# Patient Record
Sex: Male | Born: 1935 | Race: White | Hispanic: No | Marital: Married | Smoking: Former smoker
Health system: Southern US, Community
[De-identification: ages and names within clinical notes are randomized; demographics above are authoritative.]

## PROBLEM LIST (undated history)

## (undated) DIAGNOSIS — I2510 Atherosclerotic heart disease of native coronary artery without angina pectoris: Secondary | ICD-10-CM

## (undated) DIAGNOSIS — I10 Essential (primary) hypertension: Secondary | ICD-10-CM

## (undated) DIAGNOSIS — I509 Heart failure, unspecified: Secondary | ICD-10-CM

## (undated) DIAGNOSIS — M1990 Unspecified osteoarthritis, unspecified site: Secondary | ICD-10-CM

## (undated) DIAGNOSIS — K219 Gastro-esophageal reflux disease without esophagitis: Secondary | ICD-10-CM

## (undated) DIAGNOSIS — K759 Inflammatory liver disease, unspecified: Secondary | ICD-10-CM

## (undated) DIAGNOSIS — R0602 Shortness of breath: Secondary | ICD-10-CM

## (undated) DIAGNOSIS — N400 Enlarged prostate without lower urinary tract symptoms: Secondary | ICD-10-CM

## (undated) DIAGNOSIS — R21 Rash and other nonspecific skin eruption: Secondary | ICD-10-CM

## (undated) DIAGNOSIS — E782 Mixed hyperlipidemia: Secondary | ICD-10-CM

## (undated) DIAGNOSIS — E119 Type 2 diabetes mellitus without complications: Secondary | ICD-10-CM

## (undated) DIAGNOSIS — E785 Hyperlipidemia, unspecified: Secondary | ICD-10-CM

## (undated) HISTORY — DX: Atherosclerotic heart disease of native coronary artery without angina pectoris: I25.10

## (undated) HISTORY — DX: Mixed hyperlipidemia: E78.2

## (undated) HISTORY — DX: Hyperlipidemia, unspecified: E78.5

## (undated) HISTORY — DX: Essential (primary) hypertension: I10

## (undated) HISTORY — PX: CATARACT EXTRACTION W/ INTRAOCULAR LENS  IMPLANT, BILATERAL: SHX1307

## (undated) HISTORY — PX: HYDROCELE EXCISION: SHX482

## (undated) HISTORY — PX: CARDIAC CATHETERIZATION: SHX172

## (undated) HISTORY — PX: CORONARY ARTERY BYPASS GRAFT: SHX141

## (undated) HISTORY — DX: Rash and other nonspecific skin eruption: R21

---
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
## 2002-03-21 ENCOUNTER — Encounter: Payer: Self-pay | Admitting: Cardiology

## 2002-03-21 ENCOUNTER — Ambulatory Visit (HOSPITAL_COMMUNITY): Admission: RE | Admit: 2002-03-21 | Discharge: 2002-03-21 | Payer: Self-pay | Admitting: Cardiology

## 2004-04-25 ENCOUNTER — Ambulatory Visit: Payer: Self-pay | Admitting: Cardiology

## 2005-03-22 ENCOUNTER — Ambulatory Visit: Payer: Self-pay

## 2005-05-03 ENCOUNTER — Ambulatory Visit: Payer: Self-pay | Admitting: Cardiology

## 2006-04-11 ENCOUNTER — Ambulatory Visit: Payer: Self-pay | Admitting: Cardiology

## 2007-04-18 ENCOUNTER — Ambulatory Visit: Payer: Self-pay | Admitting: Cardiology

## 2008-04-13 DIAGNOSIS — I10 Essential (primary) hypertension: Secondary | ICD-10-CM | POA: Insufficient documentation

## 2008-04-13 DIAGNOSIS — E785 Hyperlipidemia, unspecified: Secondary | ICD-10-CM | POA: Insufficient documentation

## 2008-04-13 DIAGNOSIS — E119 Type 2 diabetes mellitus without complications: Secondary | ICD-10-CM

## 2008-04-13 DIAGNOSIS — I25810 Atherosclerosis of coronary artery bypass graft(s) without angina pectoris: Secondary | ICD-10-CM | POA: Insufficient documentation

## 2008-04-15 ENCOUNTER — Ambulatory Visit: Payer: Self-pay | Admitting: Cardiology

## 2008-04-21 ENCOUNTER — Ambulatory Visit: Payer: Self-pay

## 2008-07-17 ENCOUNTER — Ambulatory Visit (HOSPITAL_BASED_OUTPATIENT_CLINIC_OR_DEPARTMENT_OTHER): Admission: RE | Admit: 2008-07-17 | Discharge: 2008-07-17 | Payer: Self-pay | Admitting: Urology

## 2008-09-18 ENCOUNTER — Encounter: Payer: Self-pay | Admitting: Cardiology

## 2008-12-23 ENCOUNTER — Encounter: Payer: Self-pay | Admitting: Cardiology

## 2008-12-23 ENCOUNTER — Telehealth (INDEPENDENT_AMBULATORY_CARE_PROVIDER_SITE_OTHER): Payer: Self-pay | Admitting: *Deleted

## 2009-05-04 ENCOUNTER — Encounter: Payer: Self-pay | Admitting: Cardiology

## 2009-05-13 ENCOUNTER — Ambulatory Visit: Payer: Self-pay | Admitting: Cardiology

## 2010-01-20 ENCOUNTER — Telehealth (INDEPENDENT_AMBULATORY_CARE_PROVIDER_SITE_OTHER): Payer: Self-pay | Admitting: *Deleted

## 2010-02-07 ENCOUNTER — Encounter: Admission: RE | Admit: 2010-02-07 | Discharge: 2010-02-07 | Payer: Self-pay | Admitting: Family Medicine

## 2010-03-24 ENCOUNTER — Encounter: Payer: Self-pay | Admitting: Cardiology

## 2010-03-25 ENCOUNTER — Encounter: Payer: Self-pay | Admitting: Cardiology

## 2010-04-14 ENCOUNTER — Ambulatory Visit: Payer: Self-pay | Admitting: Cardiology

## 2010-04-14 ENCOUNTER — Encounter: Payer: Self-pay | Admitting: Cardiology

## 2010-05-15 HISTORY — PX: HERNIA REPAIR: SHX51

## 2010-06-14 NOTE — Progress Notes (Signed)
   Walk in Patient Form Recieved " Pt needs Meds filled" sent to Message Nurse [facility]  January 20, 2010 2:51 PM

## 2010-06-14 NOTE — Assessment & Plan Note (Signed)
Summary: f1y  Medications Added GLYBURIDE-METFORMIN 5-500 MG TABS (GLYBURIDE-METFORMIN) 2 tablets two times a day        Visit Type:  1  year follow up Primary Provider:  Dr. Windle Guard  CC:  No complaints.  History of Present Illness: The patient is 77 years old and return for management of CAD. He had bypass surgery in 1983 and redo bypass surgery in 1997. In 2003 all 3 grafts were patent.  He's done quite well over the past year with no chest pain shortness of breath or palpitations.  His other problems include hypertension hyperlipidemia and diabetes.  His cholesterol levels in the past have been quite good with HDL of 43 and an LDL of 55 by Dr. Jeannetta Nap.  His wife of 62 years was with him today.  Current Medications (verified): 1)  Metoprolol Succinate 50 Mg Xr24h-Tab (Metoprolol Succinate) .... Take One Tablet By Mouth Daily 2)  Vytorin 10-10 Mg Tabs (Ezetimibe-Simvastatin) .... Take One Tablet By Mouth Daily At Bedtime 3)  Benazepril Hcl 10 Mg Tabs (Benazepril Hcl) .... One Tab By Mouth Once Daily 4)  Finasteride 5 Mg Tabs (Finasteride) .... One Tab By Mouth Once Daily 5)  Actos 15 Mg Tabs (Pioglitazone Hcl) .... One Tab By Mouth Once Daily 6)  Glyburide-Metformin 5-500 Mg Tabs (Glyburide-Metformin) .... 2 Tablets Two Times A Day 7)  Aspirin Ec 325 Mg Tbec (Aspirin) .... Take One Tablet By Mouth Daily 8)  Vitamin D 13086 Iu .... One Tab By Mouth Every 2 Weeks 9)  Uroxatral 10 Mg Xr24h-Tab (Alfuzosin Hcl) .... One Tab By Mouth Once Daily  Allergies: 1)  ! Lipitor (Atorvastatin) 2)  ! Pravachol 3)  ! Crestor (Rosuvastatin Calcium)  Past History:  Past Medical History: Reviewed history from 05/04/2009 and no changes required. Current Problems:  HYPERLIPIDEMIA-MIXED (ICD-272.4) HYPERTENSION, BENIGN (ICD-401.1) CAD, AUTOLOGOUS BYPASS GRAFT (ICD-414.02) DIABETES MELLITUS, TYPE II, CONTROLLED (ICD-250.00)   IMPRESSION: 1. Coronary artery status post coronary  bypass graft surgery in 1996     with patent grafts in 2003 now stable. 2. Ejection fraction 47% by last Myoview. 3. Hyperlipidemia with good lipid profile with cholesterol 90, LDL 33,     HDL 37. 4. Diabetes. 5. Hypertension. 6. Skin rash of uncertain etiology.   Review of Systems       ROS is negative except as outlined in HPI.   Vital Signs:  Patient profile:   77 year old male Height:      69 inches Weight:      202.50 pounds BMI:     30.01 Pulse rate:   73 / minute Pulse rhythm:   regular Resp:     18 per minute BP sitting:   130 / 62  (left arm) Cuff size:   large  Vitals Entered By: Vikki Ports (April 14, 2010 8:43 AM)  Physical Exam  Additional Exam:  Gen. Well-nourished, in no distress   Neck: No JVD, thyroid not enlarged, no carotid bruits Lungs: No tachypnea, clear without rales, rhonchi or wheezes Cardiovascular: Rhythm regular, PMI not displaced,  heart sounds  normal, no murmurs or gallops, no peripheral edema, pulses normal in all 4 extremities. Abdomen: BS normal, abdomen soft and non-tender without masses or organomegaly, no hepatosplenomegaly. MS: No deformities, no cyanosis or clubbing   Neuro:  No focal sns   Skin:  no lesions    Impression & Recommendations:  Problem # 1:  CAD, AUTOLOGOUS BYPASS GRAFT (ICD-414.02) He has had prior redo bypass  surgery. He has no chest pain and his palm appears stable. His updated medication list for this problem includes:    Metoprolol Succinate 50 Mg Xr24h-tab (Metoprolol succinate) .Marland Kitchen... Take one tablet by mouth daily    Benazepril Hcl 10 Mg Tabs (Benazepril hcl) ..... One tab by mouth once daily    Aspirin Ec 325 Mg Tbec (Aspirin) .Marland Kitchen... Take one tablet by mouth daily  Orders: EKG w/ Interpretation (93000)  Problem # 2:  HYPERTENSION, BENIGN (ICD-401.1) His blood pressure is well controlled on current medications. His updated medication list for this problem includes:    Metoprolol Succinate 50 Mg  Xr24h-tab (Metoprolol succinate) .Marland Kitchen... Take one tablet by mouth daily    Benazepril Hcl 10 Mg Tabs (Benazepril hcl) ..... One tab by mouth once daily    Aspirin Ec 325 Mg Tbec (Aspirin) .Marland Kitchen... Take one tablet by mouth daily  Problem # 3:  HYPERLIPIDEMIA-MIXED (ICD-272.4) He had an excellent lipid profile recently on Vytorin. His updated medication list for this problem includes:    Vytorin 10-10 Mg Tabs (Ezetimibe-simvastatin) .Marland Kitchen... Take one tablet by mouth daily at bedtime  Patient Instructions: 1)  Your physician recommends that you continue on your current medications as directed. Please refer to the Current Medication list given to you today. 2)  Your physician wants you to follow-up in:  1 year with Dr. Eden Emms. You will receive a reminder letter in the mail two months in advance. If you don't receive a letter, please call our office to schedule the follow-up appointment.

## 2010-06-20 ENCOUNTER — Encounter: Payer: Self-pay | Admitting: Cardiovascular Disease

## 2010-06-30 ENCOUNTER — Telehealth (INDEPENDENT_AMBULATORY_CARE_PROVIDER_SITE_OTHER): Payer: Self-pay | Admitting: Radiology

## 2010-06-30 ENCOUNTER — Other Ambulatory Visit (HOSPITAL_COMMUNITY): Payer: Self-pay

## 2010-07-04 ENCOUNTER — Ambulatory Visit (HOSPITAL_COMMUNITY): Payer: Medicare Other | Attending: Cardiovascular Disease

## 2010-07-04 ENCOUNTER — Encounter: Payer: Self-pay | Admitting: Internal Medicine

## 2010-07-04 DIAGNOSIS — R9431 Abnormal electrocardiogram [ECG] [EKG]: Secondary | ICD-10-CM

## 2010-07-04 DIAGNOSIS — R0989 Other specified symptoms and signs involving the circulatory and respiratory systems: Secondary | ICD-10-CM

## 2010-07-04 DIAGNOSIS — I2510 Atherosclerotic heart disease of native coronary artery without angina pectoris: Secondary | ICD-10-CM | POA: Insufficient documentation

## 2010-07-06 ENCOUNTER — Ambulatory Visit (INDEPENDENT_AMBULATORY_CARE_PROVIDER_SITE_OTHER): Payer: Medicare Other | Admitting: Cardiovascular Disease

## 2010-07-06 ENCOUNTER — Encounter: Payer: Self-pay | Admitting: Cardiovascular Disease

## 2010-07-06 DIAGNOSIS — I10 Essential (primary) hypertension: Secondary | ICD-10-CM

## 2010-07-06 DIAGNOSIS — I2510 Atherosclerotic heart disease of native coronary artery without angina pectoris: Secondary | ICD-10-CM

## 2010-07-06 DIAGNOSIS — E785 Hyperlipidemia, unspecified: Secondary | ICD-10-CM

## 2010-07-06 NOTE — Progress Notes (Signed)
Summary: nuc pre-procedure  Phone Note Outgoing Call   Call placed by: Harlow Asa CNMT Call placed to: Patient Reason for Call: Confirm/change Appt Summary of Call: Reviewed information on Myoview Information Sheet (see scanned document for further details).  Spoke with patient.      Nuclear Med Background Indications for Stress Test: Evaluation for Ischemia, Graft Patency   History: CABG, Heart Catheterization, Myocardial Perfusion Study  History Comments: MI-ant ;  '83 CABG-Redo '97; '03 Cath-grafts patent; MPS -EF=49% (-) isch. &  ant apical scar     Nuclear Pre-Procedure Cardiac Risk Factors: Family History - CAD, Hypertension, Lipids, NIDDM Height (in): 69

## 2010-07-12 NOTE — Assessment & Plan Note (Signed)
Summary: surgical clearence/former pt of brodie/dm  appt confirm=mj   Primary Provider:  Dr. Windle Guard  CC:  follow up.  History of Present Illness: The patient is 77 years old and return for management of CAD. He had bypass surgery in 1983 and redo bypass surgery in 1997. In 2003 all 3 grafts were patent.  He's done quite well over the past year with no chest pain shortness of breath or palpitations.  His other problems include hypertension hyperlipidemia and diabetes.  His cholesterol levels in the past have been quite good with HDL of 43 and an LDL of 55 by Dr. Jeannetta Nap.  His wife of 62 years was with him today.  he needs inguinal hernia surgery with Dr Luisa Hart.  I reviewed his myovue from 07/04/10 and he has an old anterior MI with no ischemia and EF 41%  This is considered low risk with no symptoms and non vascular surgery.    He is clear to have surgery with Dr Luisa Hart.  Instructed to take lopresser right up until morning of surgery and continue in hospital.  Hold ASA per surgical recomendations  Current Problems (verified): 1)  Hyperlipidemia-mixed  (ICD-272.4) 2)  Hypertension, Benign  (ICD-401.1) 3)  Cad, Autologous Bypass Graft  (ICD-414.02) 4)  Diabetes Mellitus, Type II, Controlled  (ICD-250.00)  Current Medications (verified): 1)  Metoprolol Succinate 50 Mg Xr24h-Tab (Metoprolol Succinate) .... Take One Tablet By Mouth Daily 2)  Vytorin 10-10 Mg Tabs (Ezetimibe-Simvastatin) .... Take One Tablet By Mouth Daily At Bedtime 3)  Benazepril Hcl 10 Mg Tabs (Benazepril Hcl) .... One Tab By Mouth Once Daily 4)  Finasteride 5 Mg Tabs (Finasteride) .... One Tab By Mouth Once Daily 5)  Actos 15 Mg Tabs (Pioglitazone Hcl) .... One Tab By Mouth Once Daily 6)  Glyburide-Metformin 5-500 Mg Tabs (Glyburide-Metformin) .... 2 Tablets Two Times A Day 7)  Aspirin Ec 325 Mg Tbec (Aspirin) .... Take One Tablet By Mouth Daily 8)  Vitamin D 19147 Iu .... One Tab By Mouth Every 2 Weeks 9)   Uroxatral 10 Mg Xr24h-Tab (Alfuzosin Hcl) .... One Tab By Mouth Once Daily  Allergies (verified): 1)  ! Lipitor (Atorvastatin) 2)  ! Pravachol 3)  ! Crestor (Rosuvastatin Calcium)  Past History:  Past Medical History: Last updated: 05/04/2009 Current Problems:  HYPERLIPIDEMIA-MIXED (ICD-272.4) HYPERTENSION, BENIGN (ICD-401.1) CAD, AUTOLOGOUS BYPASS GRAFT (ICD-414.02) DIABETES MELLITUS, TYPE II, CONTROLLED (ICD-250.00)   IMPRESSION: 1. Coronary artery status post coronary bypass graft surgery in 1996     with patent grafts in 2003 now stable. 2. Ejection fraction 47% by last Myoview. 3. Hyperlipidemia with good lipid profile with cholesterol 90, LDL 33,     HDL 37. 4. Diabetes. 5. Hypertension. 6. Skin rash of uncertain etiology.   Past Surgical History: Last updated: 05/04/2009   Left hydrocelectomy.   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   Vital Signs:  Patient profile:   77 year old male Height:      69 inches Weight:  201 pounds BMI:     29.79 Pulse rate:   72 / minute Resp:     18 per minute BP sitting:   136 / 70  (left arm)  Vitals Entered By: Kem Parkinson (July 06, 2010 9:40 AM)  Physical Exam  General:  Affect appropriate Healthy:  appears stated age HEENT: normal Neck supple with no adenopathy JVP normal no bruits no thyromegaly Lungs clear with no  wheezing and good diaphragmatic motion Heart:  S1/S2 no murmur,rub, gallop or click PMI normal Abdomen: benighn, BS positve, no tenderness, no AAA no bruit.  No HSM or HJR Distal pulses intact with no bruits No edema Neuro non-focal Skin warm and dry Right inguinal hernia   Impression & Recommendations:  Problem # 1:  HYPERLIPIDEMIA-MIXED (ICD-272.4) Well controlled. Intolerant to lipitor His updated medication list for this problem includes:    Vytorin 10-10 Mg Tabs (Ezetimibe-simvastatin) .Marland Kitchen... Take one tablet by mouth daily at bedtime  Problem # 2:  HYPERTENSION, BENIGN (ICD-401.1) Well controlled His updated medication list for this problem includes:    Metoprolol Succinate 50 Mg Xr24h-tab (Metoprolol succinate) .Marland Kitchen... Take one tablet by mouth daily    Benazepril Hcl 10 Mg Tabs (Benazepril hcl) ..... One tab by mouth once daily    Aspirin Ec 325 Mg Tbec (Aspirin) .Marland Kitchen... Take one tablet by mouth daily  Problem # 3:  CAD, AUTOLOGOUS BYPASS GRAFT (ICD-414.02) Low risk myovue  No angina  Clear for inguinal hernia surgery with Dr Luisa Hart.  Cotninue BB His updated medication list for this problem includes:    Metoprolol Succinate 50 Mg Xr24h-tab (Metoprolol succinate) .Marland Kitchen... Take one tablet by mouth daily    Benazepril Hcl 10 Mg Tabs (Benazepril hcl) ..... One tab by mouth once daily    Aspirin Ec 325 Mg Tbec (Aspirin) .Marland Kitchen... Take one tablet by mouth daily  Patient Instructions: 1)  Your physician recommends that you schedule a follow-up appointment in: 6 months with Dr. Eden Emms 2)   that you continue on your current medications as directed. Please refer to the Current Medication list given to you today.

## 2010-07-12 NOTE — Assessment & Plan Note (Signed)
Summary: Cardiology Nuclear Testing  Nuclear Med Background Indications for Stress Test: Evaluation for Ischemia, Surgical Clearance, Graft Patency  Indications Comments: Pending (R) Inguinal hernia repair by [facility] [place] surgery.  History: CABG, Heart Catheterization, Myocardial Perfusion Study  History Comments: MI-AWMI ;  '83 CABG-Redo '97; '03 Cath-grafts patent; MPS -EF=49% (-) isch. &  ant apical scar.  Symptoms: DOE, Fatigue, Fatigue with Exertion, SOB  Symptoms Comments: Bilateral leg tightness with exertion per patient.   Nuclear Pre-Procedure Cardiac Risk Factors: Family History - CAD, History of Smoking, Hypertension, Lipids, NIDDM Caffeine/Decaff Intake: none NPO After: 1:30 AM Lungs: Clear IV 0.9% NS with Angio Cath: 20g     IV Site: R Forearm IV Started by: Stanton Kidney, EMT-P Chest Size (in) 44     Height (in): 69 Weight (lb): 197 BMI: 29.20 Tech Comments: metoprolol held this day, per patient.  Nuclear Med Study 1 or 2 day study:  1 day     Stress Test Type:  Eugenie Birks Reading MD:  Dietrich Pates, MD     Referring MD:  Charlton Haws Resting Radionuclide:  Technetium 63m Tetrofosmin     Resting Radionuclide Dose:  11.0 mCi  Stress Radionuclide:  Technetium 43m Tetrofosmin     Stress Radionuclide Dose:  33.0 mCi   Stress Protocol      Max HR:  81 bpm Max Systolic BP: 114 mm HgRate Pressure Product:  9234  Lexiscan: 0.4 mg   Stress Test Technologist:  Irean Hong,  RN     Nuclear Technologist:  Domenic Polite, CNMT  Rest Procedure  Myocardial perfusion imaging was performed at rest 45 minutes following the intravenous administration of Technetium 34m Tetrofosmin.  Stress Procedure  The patient received IV Lexiscan 0.4 mg over 15-seconds.  Technetium 6m Tetrofosmin injected at 30-seconds.  There were no significant changes with lexiscan. The patient had a hypotensive response with lexiscan that subsided quickly after  trendelenburg position.  Quantitative  spect images were obtained after a 45 minute delay.  QPS Raw Data Images:  Soft tissue (diaphragm, bowel acitivty) underlie heart. Stress Images:  Large defect in the anterior wall (mid, distal), anterolateral wall (distal), anteroseptal wall (distal) and apex. Rest Images:  No significant change from the stress images. Subtraction (SDS):  No evidence of ischemia. Transient Ischemic Dilatation:  1.19  (Normal <1.22)  Lung/Heart Ratio:  0.36  (Normal <0.45)  Quantitative Gated Spect Images QGS EDV:  151 ml QGS ESV:  89 ml QGS EF:  41 %   Overall Impression  Exercise Capacity: Lexiscan with no exercise. BP Response: Normal blood pressure response. Clinical Symptoms: No chest pain ECG Impression: No significant ST segment change suggestive of ischemia. Overall Impression Comments: Scar in the anterior (mid, distal), distal anteroseptal, distal anterolateral and apical walls.  No evidence of ischemia.  LVEF 41%  Appended Document: Cardiology Nuclear Testing ok for surgery  Appended Document: Cardiology Nuclear Testing report faxed to dr cornett

## 2010-07-22 ENCOUNTER — Telehealth: Payer: Self-pay | Admitting: Cardiovascular Disease

## 2010-07-26 NOTE — Progress Notes (Signed)
Summary: refill request need to resend  Phone Note Refill Request Message from:  Patient on July 22, 2010 9:26 AM  Refills Requested: Medication #1:  METOPROLOL SUCCINATE 50 MG XR24H-TAB Take one tablet by mouth daily  Medication #2:  BENAZEPRIL HCL 10 MG TABS one tab by mouth once daily  Medication #3:  VYTORIN 10-10 MG TABS Take one tablet by mouth daily at bedtime [facility] said [facility] couldnt read the fax-need to resend   Method Requested: Telephone to Pharmacy Initial call taken by: Glynda Jaeger,  July 22, 2010 9:26 AM  Follow-up for Phone Call        RX resent to pharmacy. Pt notified. Marrion Coy, CNA  July 22, 2010 10:46 AM  Follow-up by: Marrion Coy, CNA,  July 22, 2010 10:46 AM    Prescriptions: BENAZEPRIL HCL 10 MG TABS (BENAZEPRIL HCL) one tab by mouth once daily  #90 x 3   Entered by:   Marrion Coy, CNA   Authorized by:   Colon Branch, MD, [facility]   Signed by:   Marrion Coy, CNA on 07/22/2010   Method used:   Electronically to        [facility]       [address]       [place], [place]  [address]       Ph: [phone]       Fax: [phone]   RxID:   [id number] VYTORIN 10-10 MG TABS (EZETIMIBE-SIMVASTATIN) Take one tablet by mouth daily at bedtime  #90 x 3   Entered by:   Marrion Coy, CNA   Authorized by:   Colon Branch, MD, [facility]   Signed by:   Marrion Coy, CNA on 07/22/2010   Method used:   Electronically to        [facility]       [address]       [place], [place]  [address]       Ph: [phone]       Fax: [phone]   RxID:   [id number] METOPROLOL SUCCINATE 50 MG XR24H-TAB (METOPROLOL SUCCINATE) Take one tablet by mouth daily  #90 x 3   Entered by:   Marrion Coy, CNA   Authorized by:   Colon Branch, MD, [facility]   Signed by:   Marrion Coy, CNA on 07/22/2010   Method used:   Electronically to        [facility]       [address]       [place], [place]  [address]       Ph: [phone]       Fax: [phone]   RxID:   [id number]

## 2010-08-01 ENCOUNTER — Other Ambulatory Visit (HOSPITAL_COMMUNITY): Payer: Self-pay | Admitting: Surgery

## 2010-08-01 ENCOUNTER — Encounter (HOSPITAL_COMMUNITY)
Admission: RE | Admit: 2010-08-01 | Discharge: 2010-08-01 | Disposition: A | Discharge status: Home / Self Care | Payer: Medicare Other | Source: Ambulatory Visit | Attending: Surgery | Admitting: Surgery

## 2010-08-01 DIAGNOSIS — Z01811 Encounter for preprocedural respiratory examination: Secondary | ICD-10-CM

## 2010-08-01 LAB — DIFFERENTIAL
Basophils Relative: 0 % (ref 0–1)
Eosinophils Absolute: 0.2 10*3/uL (ref 0.0–0.7)
Eosinophils Relative: 3 % (ref 0–5)
Lymphs Abs: 1.2 10*3/uL (ref 0.7–4.0)
Monocytes Absolute: 0.5 10*3/uL (ref 0.1–1.0)
Monocytes Relative: 7 % (ref 3–12)
Neutrophils Relative %: 72 % (ref 43–77)

## 2010-08-01 LAB — COMPREHENSIVE METABOLIC PANEL
ALT: 15 U/L (ref 0–53)
Alkaline Phosphatase: 71 U/L (ref 39–117)
CO2: 29 mEq/L (ref 19–32)
Chloride: 104 mEq/L (ref 96–112)
GFR calc non Af Amer: >60 mL/min (ref >60)
Glucose, Bld: 133 mg/dL — ABNORMAL HIGH (ref 70–99)
Potassium: 4.9 mEq/L (ref 3.5–5.1)
Sodium: 138 mEq/L (ref 135–145)
Total Protein: 6.3 g/dL (ref 6.0–8.3)

## 2010-08-01 LAB — SURGICAL PCR SCREEN: Staphylococcus aureus: POSITIVE — AB

## 2010-08-08 ENCOUNTER — Ambulatory Visit (HOSPITAL_COMMUNITY)
Admission: RE | Admit: 2010-08-08 | Discharge: 2010-08-08 | Disposition: A | Discharge status: Home / Self Care | Payer: Medicare Other | Source: Ambulatory Visit | Attending: Surgery | Admitting: Surgery

## 2010-08-08 DIAGNOSIS — I10 Essential (primary) hypertension: Secondary | ICD-10-CM | POA: Insufficient documentation

## 2010-08-08 DIAGNOSIS — E119 Type 2 diabetes mellitus without complications: Secondary | ICD-10-CM | POA: Insufficient documentation

## 2010-08-08 DIAGNOSIS — K4090 Unilateral inguinal hernia, without obstruction or gangrene, not specified as recurrent: Secondary | ICD-10-CM | POA: Insufficient documentation

## 2010-08-08 LAB — GLUCOSE, CAPILLARY: Glucose-Capillary: 150 mg/dL — ABNORMAL HIGH (ref 70–99)

## 2010-08-09 NOTE — Op Note (Signed)
NAME:  Curtis, White NO.:  [id number]  MEDICAL RECORD NO.:  [id number]           PATIENT TYPE:  O  LOCATION:  [facility]                         FACILITY:  [facility]  PHYSICIAN:  Ameir Faria A. Ellen Goris, M.D.DATE OF BIRTH:  Apr 06, 1922  DATE OF PROCEDURE:  08/08/2010 DATE OF DISCHARGE:  08/08/2010                              OPERATIVE REPORT   PREOPERATIVE DIAGNOSIS:  Large right inguinal scrotal hernia.  POSTOPERATIVE DIAGNOSIS:  Large right inguinal scrotal hernia.  PROCEDURE:  Repair of right inguinal scrotal hernia with mesh.  SURGEON:  Maisie Fus A. Paydon Carll, MD  ANESTHESIA:  LMA converted to general anesthesia with 0.25% Sensorcaine local with epinephrine, 18 mL were used.  ESTIMATED BLOOD LOSS:  Minimal.  SPECIMENS:  None.  DRAINS:  None.  INDICATIONS FOR PROCEDURE:  The patient is an 77 year old male with a large right inguinal scrotal hernia, it has been causing a significant amount of discomfort and interfering with his quality of life.  We discussed options of repair of laparoscopic versus open, potential complications of each, and postoperative expectations of the procedure which were outlined in my history and physical.  After discussion, we received cardiac clearance from his cardiologist and proceeded to surgery.  He wished to proceed with an open repair.  Risks, benefits, and alternative therapies were discussed, which were outlined in the history and physical.  DESCRIPTION OF PROCEDURE:  The patient was brought to the operating room after being seen the holding area.  His right groin was initialed by me after answering questions.  He was brought back to the operating room, placed supine.  After induction of general anesthesia, the right inguinal region was prepped and draped in usual sterile fashion.  An oblique incision was made in the right inguinal crease.  Dissection was carried down, he had a very large right inguinal scrotal hernia.  This was  protruding out the external ring into his scrotal sac, I was able to use my hand to reduce it back in the abdominal cavity without difficulty.  I then isolate the cord structures from the sac.  I then opened the external oblique up to the internal ring.  Once I did this, I was able to reduce the hernia sac back in the preperitoneal space.  We then had the patient intubated from LMA and paralyzed for more easier handling of the hernia sac.  A large Ultrapro Prolene hernia system was used with the inner leaflet plate placed in the preperitoneal space.  I secured the connection of the mesh to the internal oblique with #0 Novafil.  We then secured the onlay with 0-Novafil to the shelving edge of the inguinal ligament, the pubic tubercle, Cooper's ligament, and to the internal oblique.  Slit was cut for the cord structures and this was sutured around the cord structures.  0-Novafil was used.  There was ample room for the cord structure to exit.  Both the ilioinguinal and iliohypogastric nerves were sacrificed because of the location of the mesh of my concern of this irrigating and potentially causing discomfort postoperatively.  We then irrigated out the wound clean.  I closed the external  oblique with 2-0 Vicryl.  3-0 Vicryl was used to approximate Scarpa fascia and 4-0 Monocryl was used to close the skin in a subcuticular fashion.  Dermabond was applied.  All final counts of sponge, needle, and instruments were found to be correct at this portion of the case.  The patient was then awoke, extubated, and taken to the recovery room in a satisfactory condition.     Levaeh Vice A. Aysa Larivee, M.D.     TAC/MEDQ  D:  08/08/2010  T:  08/09/2010  Job:  [id number]  cc:   Dr. Jeannetta Nap  Electronically Signed by Harriette Bouillon M.D. on 08/09/2010 02:01:53 PM

## 2010-08-11 NOTE — Letter (Signed)
Summary: [facility] Surgical Clearance   Imported By: Roderic Ovens 08/02/2010 10:31:45  _____________________________________________________________________  External Attachment:    Type:   Image     Comment:   External Document

## 2010-08-16 ENCOUNTER — Other Ambulatory Visit: Payer: Self-pay | Admitting: *Deleted

## 2010-08-16 ENCOUNTER — Telehealth: Payer: Self-pay | Admitting: Cardiovascular Disease

## 2010-08-16 NOTE — Telephone Encounter (Signed)
Called in to pharmacy it would not go over through epic and looks like in EMR [facility] the number for that is [phone]. When i called pharmacy states they already have them waiting on credit card number from pt i gave the correct number to pts wife

## 2010-08-16 NOTE — Telephone Encounter (Signed)
Pt's rx's not filled, waiting on clarification on metoprolol, vytorin, and bonsipril

## 2010-08-16 NOTE — Telephone Encounter (Signed)
Already spoke with pt and pharmacy correct number for Curtis White is [phone] the other number given is [facility]

## 2010-08-23 ENCOUNTER — Other Ambulatory Visit: Payer: Self-pay

## 2010-08-23 MED ORDER — EZETIMIBE-SIMVASTATIN 10-10 MG PO TABS: 1.0000 | ORAL_TABLET | Freq: Every day | ORAL | Status: DC

## 2010-08-23 MED ORDER — METOPROLOL SUCCINATE ER 50 MG PO TB24: 50.0000 mg | ORAL_TABLET | Freq: Every day | ORAL | Status: DC

## 2010-08-23 MED ORDER — BENAZEPRIL HCL 10 MG PO TABS: 10.0000 mg | ORAL_TABLET | Freq: Every day | ORAL | Status: DC

## 2010-08-23 NOTE — Telephone Encounter (Signed)
Refill faxed

## 2010-08-23 NOTE — Telephone Encounter (Signed)
Refill done.  

## 2010-08-25 LAB — POCT I-STAT 4, (NA,K, GLUC, HGB,HCT)
Glucose, Bld: 124 mg/dL — ABNORMAL HIGH (ref 70–99)
HCT: 36 % — ABNORMAL LOW (ref 39.0–52.0)
Hemoglobin: 12.2 g/dL — ABNORMAL LOW (ref 13.0–17.0)
Sodium: 140 mEq/L (ref 135–145)

## 2010-09-27 NOTE — Assessment & Plan Note (Signed)
[facility]                            CARDIOLOGY OFFICE NOTE   NAME:Curtis White, Diveley                      MRN:          [id number]  DATE:04/18/2007                            DOB:          1921/11/17    PRIMARY CARE PHYSICIAN:  Dr. Windle Guard.   BRIEF HISTORY:  Mr. Hynek is 77 years old and returned for management  of his coronary heart disease.  He had coronary bypass surgery in 1996,  and his last catheterization was in 2003 at which time all his grafts  were patent.  Ejection fraction was 50%.   He says he has done quite well since then.  He has no symptoms of chest  pain or palpitations.  He says sometimes after he eats a heavy meal he  gets short of breath, but, otherwise, he has had no symptoms like that.   He has had some pain in his calves with walking, but this does not seem  to be any worse going up a hill than on level ground.  He has also had a  skin rash over the last couple of months which is pruritic.   PAST MEDICAL HISTORY:  Significant for diabetes, hyperlipidemia, and  hypertension.   CURRENT MEDICATIONS:  Toprol, aspirin, Lotensin, Glucovance, Actos,  niacin, and Vytorin.   PHYSICAL EXAMINATION:  VITAL SIGNS:  Blood pressure 159/80, pulse 74 and  regular.  NECK:  There was no venous distention.  Carotid pulses were full without  bruits.  CHEST:  Clear.  HEART:  __________  , no murmurs, rubs or gallops.  ABDOMEN:  Soft, normal bowel sounds.  EXTREMITIES:  Peripheral pulses are full.  There was no peripheral  edema.  SKIN:  There was an erythematous __________  rash over the trunk and  extremities.  This did not appear to be distributed in a symmetrical  fashion.   IMPRESSION:  1. Coronary artery status post coronary bypass graft surgery in 1996      with patent grafts in 2003 now stable.  2. Ejection fraction 47% by last Myoview.  3. Hyperlipidemia with good lipid profile with cholesterol 90, LDL 33,      HDL  37.  4. Diabetes.  5. Hypertension.  6. Skin rash of uncertain etiology.   RECOMMENDATIONS:  I think Mr. Petite is doing quite well.  I am not  sure of the etiology of the skin rash.  I think it is probably not  related to his medications and because the distribution does not appear  to be typical for that and there have been no recent changes in  medications.  He is to see Dr. Jeannetta Nap in the near future, and I will defer further  evaluation to him.  I will plan to see him back in followup in a year.     Bruce Elvera Lennox Juanda Chance, MD, [facility]  Electronically Signed    BRB/MedQ  DD: 04/18/2007  DT: 04/18/2007  Job #: [id number]

## 2010-09-27 NOTE — Assessment & Plan Note (Signed)
[facility]                            CARDIOLOGY OFFICE NOTE   Abdishakur, Gottschall KAVAUGHN FAUCETT                      MRN:          [id number]  DATE:04/15/2008                            DOB:          Jan 03, 1922    PRIMARY CARE PHYSICIAN:  Windle Guard, MD   CLINICAL HISTORY:  Mr. Breach is 77 years old and returned for  management of his coronary heart disease.  He had redo bypass surgery in  1997 and his last catheterization was in 2003, at which time, his 3  grafts were all patent.  His last Myoview scan was in 2006, which showed  a scar and possible periscar ischemia with an ejection fraction of 47%.   He states he has been feeling well.  He has not had any chest pain,  shortness of breath, or palpitations.   PAST MEDICAL HISTORY:  Significant for diabetes, hyperlipidemia, and  hypertension.   CURRENT MEDICATIONS:  Toprol, Actos, niacin, Vytorin, benazepril,  Uroxatral, and finasteride, which we just started for BPH.   PHYSICAL EXAMINATION:  VITAL SIGNS:  Blood pressure 129/64 and pulse 76  and regular.  NECK:  There is no vein distension.  The carotid pulses were full  without bruits.  CHEST:  Clear.  CARDIAC:  Rhythm was regular.  I could hear no murmurs or gallops.  ABDOMEN:  Soft and normal bowel sounds.  EXTREMITIES:  Peripheral pulses were equal.  There is no peripheral  edema.   Electrocardiogram showed an old anterior infarction.   IMPRESSION:  1. Coronary artery disease, status post redo bypass surgery in 1996      with patent grafts in 2003.  2. Ejection fraction 47% by last Myoview with anterior scar.  3. Hyperlipidemia.  4. Diabetes.  5. Hypertension.   He brought in his cholesterol panel which was 89 total with an HDL of 44  and triglyceride of 63, his LDL was 32.   I think Mr. Eskew is doing quite well.  He is now 13 years post redo  bypass surgery and we will plan to evaluate him with a rest/stress  Myoview scan.  If  this is negative, I will see him back in a year.  I  discussed possibly coming off Vytorin since his LDL is still low and  just getting on a  simvastatin, but he has a low co-pay and is tolerating this well and  prefers to continue his current treatment.     Bruce Elvera Lennox Juanda Chance, MD, [facility]  Electronically Signed    BRB/MedQ  DD: 04/15/2008  DT: 04/16/2008  Job #: [id number]

## 2010-09-27 NOTE — Op Note (Signed)
NAME:  Curtis White, DRAGAN NO.:  [id number]   MEDICAL RECORD NO.:  [id number]          PATIENT TYPE:  AMB   LOCATION:  [facility]                         FACILITY:  [facility]   PHYSICIAN:  Maretta Bees. Vonita Moss, M.D.DATE OF BIRTH:  [birth year]   DATE OF PROCEDURE:  07/17/2008  DATE OF DISCHARGE:                               OPERATIVE REPORT   PREOPERATIVE DIAGNOSIS:  Large left hydrocele.   POSTOPERATIVE DIAGNOSIS:  Large left hydrocele.   PROCEDURE:  Left hydrocelectomy.   SURGEON:  Dr. Larey Dresser   ANESTHESIA:  General.   INDICATIONS:  This gentleman has had progressive growth of a left  scrotal mass, diagnosed as a hydrocele.  It is uncomfortable and he  wishes it to be removed.  He was advised about the risk of hemorrhage,  infection, bruising and swelling.   PROCEDURE:  The patient was brought to the operating room and placed in  lithotomy position.  External genitalia were prepped and draped in usual  fashion.  A transverse incision was made over the left scrotal  compartment and his hydrocele identified and drained of 240 mL of  typical clear yellow fluid.  Excess hydrocele sac was excised using  cautery.  The edges of the residual hydrocele sac were sutured with 3-0  chromic catgut and inverted behind the testicle to prevent recurrence.  The other edges of the resected hydrocele were either sutured or  coagulated for hemostasis.  Marcaine 0.25% was injected for postop  analgesia.  The scrotal wall was closed in two layers of running 3-0  chromic catgut.   Estimated blood loss was 10 mL.  He tolerated the procedure well.  Sponge, needle, instrument count was correct.  He was taken to the  recovery room in good condition, having tolerated the procedure well,  after cleaning and dressing the wound with dry sterile gauze dressings.      Maretta Bees. Vonita Moss, M.D.  Electronically Signed     LJP/MEDQ  D:  07/17/2008  T:  07/17/2008  Job:  [id number]

## 2010-09-30 NOTE — Cardiovascular Report (Signed)
NAME:  Curtis White, Curtis White NO.:  [id number]   MEDICAL RECORD NO.:  [id number]                   PATIENT TYPE:  OIB   LOCATION:  2857                                 FACILITY:  [facility]   PHYSICIAN:  Charlies Constable, M.D. [facility]              DATE OF BIRTH:  10-20-1921   DATE OF PROCEDURE:  03/21/2002  DATE OF DISCHARGE:                              CARDIAC CATHETERIZATION   CLINICAL HISTORY:  The patient is 77 years old and had re-do bypass surgery  in 1997 at which time a new LIMA was placed to the LAD and old vein graft to  the right and circumflex artery were left in place. He recently had a  screening Cardiolite scan which suggested apical scarring with superimposed  ischemia and for this reason he is brought in for evaluation angiography.   PROCEDURES PERFORMED:  1. Left heart catheterization.  2. Selective coronary angiography.  3. Vein graft angiography.  4. Left internal mammary artery graft angiography.  5. Distal aortography.   DESCRIPTION OF PROCEDURE:  The procedure was performed via the right femoral  artery using an arterial sheath and 6 French preformed coronary catheters.  A front wall arterial puncture was performed and Omnipaque contrast was  used. JR4 catheters were used for injection of the vein graft.  A LIMA  catheter was used for injection of the LIMA graft.  A distal aortogram was  performed to rule out abdominal aortic aneurysm.  The patient tolerated the  procedure well and left the laboratory in satisfactory condition.   RESULTS:  The aortic pressure was 118/57 with a mean of 81 and left  ventricular pressure was 118/8.   The left main coronary artery:  The left main coronary artery had a 90%  distal stenosis.   Left anterior descending:  The left anterior descending artery was  completely occluded after a septal perforator.   Circumflex artery:  The circumflex artery was completely occluded after a  small atrium, a small  marginal branch.   Right coronary artery:  The right coronary had multiple 95% proximal mid and  distal stenosis with competing flow distally with the vein graft.   The saphenous vein graft to the posterior descending branch of the right  coronary artery was patent and functioned normally.  There was 70% narrowing  in the proximal portion of the posterior descending branch and there was 95%  narrowing in the AV portion of the right coronary artery distal to the  posterior descending branch before a posterolateral branch, both of which  threatened the posterolateral branch.   The saphenous vein graft to the marginal branch of the circumflex artery was  patent.  There was 30% distal stenosis with some irregularities but there  was no significant distal disease in the native vessel.   The LIMA graft to the LAD was patent and functioned normally.  There was 50%  narrowing in  the mid to distal LAD after this insertion site.   LEFT VENTRICULOGRAPHY:  The left ventriculogram performed in the RAO  projection showed hypokinesis to akinesis to the apex.  The overall wall  motion was fairly good.  Estimated ejection fraction was 50%.   DISTAL AORTOGRAM:  A distal aortogram was performed which showed patent  renal arteries and no significant iliac obstruction.   CONCLUSIONS:  1. Coronary artery disease, status post re-do coronary artery bypass graft     surgery in 1997.  2. Severe native disease with 90% stenosis of the left main, total occlusion     of the left anterior descending and circumflex arteries and multiple 90-     95% stenoses in the right coronary artery.  3. Patent saphenous vein graft to the posterior descending branch to the     right coronary artery with 95% stenosis distal to the graft insertion     site threatening the posterolateral branch, patent vein graft to the     marginal branch of the circumflex artery, with 30% narrowing in the     distal portion of the graft, and  a patent left internal mammary artery     graft to the left anterior descending with 50% narrowing in the left     anterior descending distal to the graft insertion site (the vein grafts     are old from his first bypass surgery and the left internal mammary     artery graft is new from the recent bypass surgery).  4. Apical wall hypo to akinesis.   RECOMMENDATIONS:  The patient has potential for ischemia in the  posterolateral branch to the right coronary artery.  He is not having any  symptoms in this area, did not show up as positive on his scan.  We will  plan continued medical management. We will have him see a physician  assistant in 1-2 weeks for a groin check and I will see him back in a year.                                                  Charlies Constable, M.D. [facility]    BB/MEDQ  D:  03/21/2002  T:  03/23/2002  Job:  [id number]   cc:   Windle Guard, M.D.  [address]  [place], [place] [address]  Fax: [phone]   Cardiopulmonary Laboratory

## 2010-09-30 NOTE — Assessment & Plan Note (Signed)
[facility]                            CARDIOLOGY OFFICE NOTE   BOBIE, KISTLER                      MRN:          [id number]  DATE:04/11/2006                            DOB:          1922/01/03    PRIMARY CARE PHYSICIAN:  Windle Guard, M.D.   CLINICAL HISTORY:  Mr. Mester is 77 years old. He had redo bypass  surgery in 1996. His last catheterization was in 2003 at which time all  of his grafts were patent, and he had apical wall hypokinesis. He has  had a previous anterior wall infarction with an ejection fraction of  50%.   He has been doing quite well over the past year. He has had no chest  pain, no shortness of breath, no palpitations. He does say he had some  pain in his legs when he walks.   PAST MEDICAL HISTORY:  Significant for:  1. Diabetes.  2. Hyperlipidemia.  3. Hypertension.   CURRENT MEDICATIONS:  1. Toprol.  2. Aspirin.  3. Lotensin.  4. Glucovance.  5. Actos.  6. Niacin.  7. Vytorin.   On examination, the blood pressure was 122/78 with a pulse of 72 and  regular.  There was no venous distention. The carotid pulses were full without  bruits.  Chest was clear without rales or rhonchi.  The cardiac rhythm is regular. He has no murmurs or gallops.  The abdomen was soft without organomegaly.  Peripheral pulses were full .There was no peripheral edema.   An EKG showed an old anterior wall infarction. His lipid panel from Dr.  Jeannetta Nap' office in November showed a total cholesterol of 111 and a HDH  of 35 and a LDL of 51. His hemoglobin A1c was slightly elevated at 6.3.   IMPRESSION:  1. Coronary artery disease status post redo bypass surgery in 1997      with patent grafts in 2003.  2. Ejection fraction 47% by a recent Myoview with an anterior      infarction and apical scar.  3. Diabetes mellitus.  4. Hypertension.  5. Hyperlipidemia.   RECOMMENDATIONS:  I think Mr. Cherne is doing quite well and is  stable. His lipid panel is pretty good, although his last LDL was on the  borderline low side, and his hemoglobin A1c was slightly elevated. I  encouraged him to continue walking on a regular basis and lose weight.  We will not make any change in his medications, and Dr. Jeannetta Nap can  decide about any adjustment of his diabetic medications. I will plan to  see him back in cardiac followup in one year.     Bruce Elvera Lennox Juanda Chance, MD, [facility]  Electronically Signed    BRB/MedQ  DD: 04/11/2006  DT: 04/11/2006  Job #: [id number]

## 2010-11-29 ENCOUNTER — Encounter: Payer: Self-pay | Admitting: Cardiovascular Disease

## 2011-01-02 ENCOUNTER — Encounter: Payer: Self-pay | Admitting: Cardiovascular Disease

## 2011-01-02 ENCOUNTER — Ambulatory Visit (INDEPENDENT_AMBULATORY_CARE_PROVIDER_SITE_OTHER): Payer: Medicare Other | Admitting: Cardiovascular Disease

## 2011-01-02 DIAGNOSIS — I10 Essential (primary) hypertension: Secondary | ICD-10-CM

## 2011-01-02 DIAGNOSIS — E119 Type 2 diabetes mellitus without complications: Secondary | ICD-10-CM

## 2011-01-02 DIAGNOSIS — E785 Hyperlipidemia, unspecified: Secondary | ICD-10-CM

## 2011-01-02 DIAGNOSIS — I25810 Atherosclerosis of coronary artery bypass graft(s) without angina pectoris: Secondary | ICD-10-CM

## 2011-01-02 MED ORDER — METOPROLOL SUCCINATE ER 50 MG PO TB24: 50.0000 mg | ORAL_TABLET | Freq: Every day | ORAL | Status: DC

## 2011-01-02 MED ORDER — EZETIMIBE-SIMVASTATIN 10-10 MG PO TABS: 1.0000 | ORAL_TABLET | Freq: Every day | ORAL | Status: DC

## 2011-01-02 MED ORDER — BENAZEPRIL HCL 10 MG PO TABS: 10.0000 mg | ORAL_TABLET | Freq: Every day | ORAL | Status: DC

## 2011-01-02 NOTE — Assessment & Plan Note (Signed)
Well controlled.  Continue current medications and low sodium Dash type diet.    

## 2011-01-02 NOTE — Assessment & Plan Note (Signed)
Continue dual oral hypoglycemics.  F/U HbA1C Dr Clarene Duke

## 2011-01-02 NOTE — Progress Notes (Signed)
The patient is 77 years old and return for management of CAD. He had bypass surgery in 1983 and redo bypass surgery in 1997. In 2003 all 3 grafts were patent.  He's done quite well over the past year with no chest pain shortness of breath or palpitations.  His other problems include hypertension hyperlipidemia and diabetes.  His cholesterol levels in the past have been quite good with HDL of 43 and an LDL of 55 by Dr. Jeannetta Nap.  His wife of 62 years was with him today. he needs inguinal hernia surgery with Dr Luisa Hart. I reviewed his myovue from 07/04/10 and he has an old anterior MI with no ischemia and EF 41% This is considered low risk with no symptoms and non vascular surgery.   ROS: Denies fever, malais, weight loss, blurry vision, decreased visual acuity, cough, sputum, SOB, hemoptysis, pleuritic pain, palpitaitons, heartburn, abdominal pain, melena, lower extremity edema, claudication, or rash.  All other systems reviewed and negative  General: Affect appropriate Healthy:  appears stated age HEENT: normal Neck supple with no adenopathy JVP normal no bruits no thyromegaly Lungs clear with no wheezing and good diaphragmatic motion Heart:  S1/S2 no murmur,rub, gallop or click PMI normal Abdomen: benighn, BS positve, no tenderness, no AAA no bruit.  No HSM or HJR Distal pulses intact with no bruits No edema Neuro non-focal Skin warm and dry No muscular weakness   Current Outpatient Prescriptions  Medication Sig Dispense Refill  . alfuzosin (UROXATRAL) 10 MG 24 hr tablet Take 10 mg by mouth daily.        Marland Kitchen aspirin EC 325 MG tablet Take 325 mg by mouth daily.        . benazepril (LOTENSIN) 10 MG tablet Take 1 tablet (10 mg total) by mouth daily.  90 tablet  3  . ergocalciferol (VITAMIN D2) 50000 UNITS capsule Take 50,000 Units by mouth every 14 (fourteen) days.        Marland Kitchen ezetimibe-simvastatin (VYTORIN) 10-10 MG per tablet Take 1 tablet by mouth at bedtime.  90 tablet  3  . finasteride  (PROSCAR) 5 MG tablet Take 5 mg by mouth daily.        Marland Kitchen glyBURIDE-metformin (GLUCOVANCE) 5-500 MG per tablet Take 2 tablets by mouth 2 (two) times daily with a meal.        . metoprolol (TOPROL XL) 50 MG 24 hr tablet Take 1 tablet (50 mg total) by mouth daily.  90 tablet  3  . pioglitazone (ACTOS) 15 MG tablet Take 15 mg by mouth daily.          Allergies  Atorvastatin; Pravastatin sodium; and Rosuvastatin  Electrocardiogram:  Assessment and Plan

## 2011-01-02 NOTE — Assessment & Plan Note (Addendum)
Cholesterol is at goal.  Continue current dose of statin and diet Rx.  No myalgias or side effects.  F/U  LFT's in 6 months. No results found for this basename: LDLCALC   F/U labs Dr Clarene Duke  Continue vytorin

## 2011-01-02 NOTE — Assessment & Plan Note (Signed)
Stable with no angina and good activity level.  Continue medical Rx  

## 2011-01-02 NOTE — Patient Instructions (Signed)
Your physician wants you to follow-up in: 6 MONTHS You will receive a reminder letter in the mail two months in advance. If you don't receive a letter, please call our office to schedule the follow-up appointment. 

## 2011-04-13 NOTE — Telephone Encounter (Signed)
Can you please close encounter 

## 2011-06-20 ENCOUNTER — Encounter: Payer: Self-pay | Admitting: Cardiovascular Disease

## 2011-06-20 ENCOUNTER — Ambulatory Visit (INDEPENDENT_AMBULATORY_CARE_PROVIDER_SITE_OTHER): Payer: Medicare Other | Admitting: Cardiovascular Disease

## 2011-06-20 VITALS — BP 135/68 | HR 64 | Ht 70.0 in | Wt 193.0 lb

## 2011-06-20 DIAGNOSIS — I2510 Atherosclerotic heart disease of native coronary artery without angina pectoris: Secondary | ICD-10-CM

## 2011-06-20 DIAGNOSIS — I25810 Atherosclerosis of coronary artery bypass graft(s) without angina pectoris: Secondary | ICD-10-CM

## 2011-06-20 NOTE — Assessment & Plan Note (Signed)
Well controlled.  Continue current medications and low sodium Dash type diet.    

## 2011-06-20 NOTE — Assessment & Plan Note (Signed)
Cholesterol is at goal.  Continue current dose of statin and diet Rx.  No myalgias or side effects.  F/U  LFT's in 6 months. No results found for this basename: LDLCALC             

## 2011-06-20 NOTE — Assessment & Plan Note (Signed)
Stable with no angina and good activity level.  Continue medical Rx  

## 2011-06-20 NOTE — Progress Notes (Signed)
The patient is 78 years old and return for management of CAD. He had bypass surgery in 1983 and redo bypass surgery in 1997. In 2003 all 3 grafts were patent.  He's done quite well over the past year with no chest pain shortness of breath or palpitations.  His other problems include hypertension hyperlipidemia and diabetes.  His cholesterol levels in the past have been quite good with HDL of 43 and an LDL of 55 by Dr. Jeannetta Nap.  His wife of 62 years was with him today  Had uncomplicated hernia surgery 3/12     I reviewed his myovue from 07/04/10 and he has an old anterior MI with no ischemia and EF 41%   ROS: Denies fever, malais, weight loss, blurry vision, decreased visual acuity, cough, sputum, SOB, hemoptysis, pleuritic pain, palpitaitons, heartburn, abdominal pain, melena, lower extremity edema, claudication, or rash.  All other systems reviewed and negative  General: Affect appropriate Desheveled older male HEENT: normal Neck supple with no adenopathy JVP normal no bruits no thyromegaly Lungs clear with no wheezing and good diaphragmatic motion Heart:  S1/S2 no murmur, no rub, gallop or click PMI normal Abdomen: benighn, BS positve, no tenderness, no AAA no bruit.  No HSM or HJR Distal pulses intact with no bruits No edema Neuro non-focal Skin warm and dry No muscular weakness   Current Outpatient Prescriptions  Medication Sig Dispense Refill  . alfuzosin (UROXATRAL) 10 MG 24 hr tablet Take 10 mg by mouth daily.        Marland Kitchen aspirin EC 325 MG tablet Take 325 mg by mouth daily.        . benazepril (LOTENSIN) 10 MG tablet Take 1 tablet (10 mg total) by mouth daily.  90 tablet  3  . ergocalciferol (VITAMIN D2) 50000 UNITS capsule Take 50,000 Units by mouth every 14 (fourteen) days.        Marland Kitchen ezetimibe-simvastatin (VYTORIN) 10-10 MG per tablet Take 1 tablet by mouth at bedtime.  90 tablet  3  . finasteride (PROSCAR) 5 MG tablet Take 5 mg by mouth daily.        Marland Kitchen glyBURIDE-metformin  (GLUCOVANCE) 5-500 MG per tablet Take 2 tablets by mouth 2 (two) times daily with a meal.        . metoprolol (TOPROL XL) 50 MG 24 hr tablet Take 1 tablet (50 mg total) by mouth daily.  90 tablet  3  . pioglitazone (ACTOS) 15 MG tablet Take 15 mg by mouth daily.          Allergies  Atorvastatin; Pravastatin sodium; and Rosuvastatin  Electrocardiogram:  NSR rate 64 poor R wave progression Old lateral infarct  Assessment and Plan

## 2011-06-20 NOTE — Assessment & Plan Note (Signed)
Discussed low carb diet.  Target hemoglobin A1c is 6.5 or less.  Continue current medications.  

## 2011-06-20 NOTE — Patient Instructions (Signed)
Your physician wants you to follow-up in: YEAR WITH DR NISHAN  You will receive a reminder letter in the mail two months in advance. If you don't receive a letter, please call our office to schedule the follow-up appointment.  Your physician recommends that you continue on your current medications as directed. Please refer to the Current Medication list given to you today. 

## 2012-03-25 ENCOUNTER — Other Ambulatory Visit: Payer: Self-pay | Admitting: *Deleted

## 2012-03-25 MED ORDER — BENAZEPRIL HCL 10 MG PO TABS: 10.0000 mg | ORAL_TABLET | Freq: Every day | ORAL | Status: DC

## 2012-04-12 ENCOUNTER — Telehealth: Payer: Self-pay | Admitting: Cardiovascular Disease

## 2012-04-12 MED ORDER — METOPROLOL SUCCINATE ER 50 MG PO TB24: 50.0000 mg | ORAL_TABLET | Freq: Every day | ORAL | Status: DC

## 2012-04-12 MED ORDER — EZETIMIBE-SIMVASTATIN 10-10 MG PO TABS: 1.0000 | ORAL_TABLET | Freq: Every day | ORAL | Status: DC

## 2012-04-12 NOTE — Telephone Encounter (Signed)
New message:  Pt called and stated he needed a prescription [facility] for Met[facility] and Vytorin.  The pharmacy told him we had to call this in for him that they could not call us.  He is out of Metoprolol but did not want any called into a local pharmacy.

## 2012-04-15 ENCOUNTER — Telehealth: Payer: Self-pay

## 2012-04-15 ENCOUNTER — Other Ambulatory Visit: Payer: Self-pay | Admitting: Cardiovascular Disease

## 2012-04-15 MED ORDER — EZETIMIBE-SIMVASTATIN 10-10 MG PO TABS: 1.0000 | ORAL_TABLET | Freq: Every day | ORAL | Status: AC

## 2012-04-15 MED ORDER — METOPROLOL SUCCINATE ER 50 MG PO TB24: 50.0000 mg | ORAL_TABLET | Freq: Every day | ORAL | Status: DC

## 2012-04-15 MED ORDER — EZETIMIBE-SIMVASTATIN 10-10 MG PO TABS: 1.0000 | ORAL_TABLET | Freq: Every day | ORAL | Status: DC

## 2012-04-15 NOTE — Telephone Encounter (Signed)
Mr Curtis White is requesting a 30 day supply of his Vytorin and Metoprolol at [facility].  He states he hasn't received his refill from [facility] yet and he is out.  Rx was sent to [facility] and pt was notified.

## 2012-06-06 ENCOUNTER — Other Ambulatory Visit: Payer: Self-pay | Admitting: *Deleted

## 2012-06-06 MED ORDER — METOPROLOL SUCCINATE ER 50 MG PO TB24: 50.0000 mg | ORAL_TABLET | Freq: Every day | ORAL | Status: DC

## 2012-07-22 ENCOUNTER — Other Ambulatory Visit: Payer: Self-pay | Admitting: *Deleted

## 2012-07-22 ENCOUNTER — Telehealth: Payer: Self-pay | Admitting: Cardiovascular Disease

## 2012-07-22 MED ORDER — METOPROLOL SUCCINATE ER 50 MG PO TB24: 50.0000 mg | ORAL_TABLET | Freq: Every day | ORAL | Status: DC

## 2012-07-22 MED ORDER — BENAZEPRIL HCL 10 MG PO TABS: 10.0000 mg | ORAL_TABLET | Freq: Every day | ORAL | Status: DC

## 2012-07-22 NOTE — Telephone Encounter (Signed)
Walk in request done to refill meds.

## 2012-07-22 NOTE — Telephone Encounter (Signed)
Walk In pt Form " Pt Needs Refills" Sent to Message Nurse  07/22/12/KM

## 2012-07-29 ENCOUNTER — Emergency Department (HOSPITAL_COMMUNITY): Payer: Medicare Other

## 2012-07-29 ENCOUNTER — Encounter (HOSPITAL_COMMUNITY): Payer: Self-pay | Admitting: *Deleted

## 2012-07-29 ENCOUNTER — Inpatient Hospital Stay (HOSPITAL_COMMUNITY)
Admission: EM | Admit: 2012-07-29 | Discharge: 2012-08-01 | DRG: 481 | Disposition: A | Discharge status: Skilled Nursing Facility | Payer: Medicare Other | Attending: Internal Medicine | Admitting: Internal Medicine

## 2012-07-29 DIAGNOSIS — S72102A Unspecified trochanteric fracture of left femur, initial encounter for closed fracture: Secondary | ICD-10-CM

## 2012-07-29 DIAGNOSIS — Z79899 Other long term (current) drug therapy: Secondary | ICD-10-CM

## 2012-07-29 DIAGNOSIS — Z951 Presence of aortocoronary bypass graft: Secondary | ICD-10-CM

## 2012-07-29 DIAGNOSIS — Y92009 Unspecified place in unspecified non-institutional (private) residence as the place of occurrence of the external cause: Secondary | ICD-10-CM

## 2012-07-29 DIAGNOSIS — E875 Hyperkalemia: Secondary | ICD-10-CM | POA: Diagnosis present

## 2012-07-29 DIAGNOSIS — I2510 Atherosclerotic heart disease of native coronary artery without angina pectoris: Secondary | ICD-10-CM

## 2012-07-29 DIAGNOSIS — W010XXA Fall on same level from slipping, tripping and stumbling without subsequent striking against object, initial encounter: Secondary | ICD-10-CM | POA: Diagnosis present

## 2012-07-29 DIAGNOSIS — S72002A Fracture of unspecified part of neck of left femur, initial encounter for closed fracture: Secondary | ICD-10-CM

## 2012-07-29 DIAGNOSIS — E782 Mixed hyperlipidemia: Secondary | ICD-10-CM | POA: Diagnosis present

## 2012-07-29 DIAGNOSIS — I509 Heart failure, unspecified: Secondary | ICD-10-CM | POA: Diagnosis present

## 2012-07-29 DIAGNOSIS — E785 Hyperlipidemia, unspecified: Secondary | ICD-10-CM | POA: Diagnosis present

## 2012-07-29 DIAGNOSIS — I25810 Atherosclerosis of coronary artery bypass graft(s) without angina pectoris: Secondary | ICD-10-CM | POA: Diagnosis present

## 2012-07-29 DIAGNOSIS — I5022 Chronic systolic (congestive) heart failure: Secondary | ICD-10-CM

## 2012-07-29 DIAGNOSIS — Z01810 Encounter for preprocedural cardiovascular examination: Secondary | ICD-10-CM

## 2012-07-29 DIAGNOSIS — S72002D Fracture of unspecified part of neck of left femur, subsequent encounter for closed fracture with routine healing: Secondary | ICD-10-CM

## 2012-07-29 DIAGNOSIS — S72143A Displaced intertrochanteric fracture of unspecified femur, initial encounter for closed fracture: Principal | ICD-10-CM | POA: Diagnosis present

## 2012-07-29 DIAGNOSIS — E119 Type 2 diabetes mellitus without complications: Secondary | ICD-10-CM | POA: Diagnosis present

## 2012-07-29 DIAGNOSIS — Z87891 Personal history of nicotine dependence: Secondary | ICD-10-CM

## 2012-07-29 DIAGNOSIS — I10 Essential (primary) hypertension: Secondary | ICD-10-CM | POA: Diagnosis present

## 2012-07-29 HISTORY — DX: Benign prostatic hyperplasia without lower urinary tract symptoms: N40.0

## 2012-07-29 LAB — CBC WITH DIFFERENTIAL/PLATELET
HCT: 35.1 % — ABNORMAL LOW (ref 39.0–52.0)
Hemoglobin: 12.4 g/dL — ABNORMAL LOW (ref 13.0–17.0)
Lymphocytes Relative: 6 % — ABNORMAL LOW (ref 12–46)
Lymphs Abs: 0.8 10*3/uL (ref 0.7–4.0)
MCV: 88 fL (ref 78.0–100.0)
Monocytes Absolute: 0.6 10*3/uL (ref 0.1–1.0)
Monocytes Relative: 5 % (ref 3–12)
Neutro Abs: 10.5 10*3/uL — ABNORMAL HIGH (ref 1.7–7.7)
WBC: 11.9 10*3/uL — ABNORMAL HIGH (ref 4.0–10.5)

## 2012-07-29 LAB — CREATININE, SERUM: GFR calc Af Amer: >90 mL/min (ref >90)

## 2012-07-29 LAB — COMPREHENSIVE METABOLIC PANEL
Albumin: 3.7 g/dL (ref 3.5–5.2)
BUN: 20 mg/dL (ref 6–23)
Calcium: 8.9 mg/dL (ref 8.4–10.5)
GFR calc Af Amer: 90 mL/min — ABNORMAL LOW (ref >90)
Glucose, Bld: 209 mg/dL — ABNORMAL HIGH (ref 70–99)
Sodium: 135 mEq/L (ref 135–145)
Total Protein: 6.6 g/dL (ref 6.0–8.3)

## 2012-07-29 LAB — APTT: aPTT: 31 seconds (ref 24–37)

## 2012-07-29 LAB — CBC
HCT: 32.6 % — ABNORMAL LOW (ref 39.0–52.0)
Hemoglobin: 11.4 g/dL — ABNORMAL LOW (ref 13.0–17.0)
MCH: 30.3 pg (ref 26.0–34.0)
MCV: 86.7 fL (ref 78.0–100.0)
RBC: 3.76 MIL/uL — ABNORMAL LOW (ref 4.22–5.81)

## 2012-07-29 LAB — GLUCOSE, CAPILLARY: Glucose-Capillary: 160 mg/dL — ABNORMAL HIGH (ref 70–99)

## 2012-07-29 MED ORDER — ACETAMINOPHEN 650 MG RE SUPP: 650.0000 mg | Freq: Four times a day (QID) | RECTAL | Status: DC | PRN

## 2012-07-29 MED ORDER — INSULIN ASPART 100 UNIT/ML ~~LOC~~ SOLN
0.0000 [IU] | Freq: Three times a day (TID) | SUBCUTANEOUS | Status: DC
  Administered 2012-07-30: 1 [IU] via SUBCUTANEOUS
  Administered 2012-07-30: 2 [IU] via SUBCUTANEOUS

## 2012-07-29 MED ORDER — ACETAMINOPHEN 325 MG PO TABS: 650.0000 mg | ORAL_TABLET | Freq: Four times a day (QID) | ORAL | Status: DC | PRN

## 2012-07-29 MED ORDER — SODIUM CHLORIDE 0.9 % IV SOLN
INTRAVENOUS | Status: AC
  Administered 2012-07-29: 17:00:00 via INTRAVENOUS

## 2012-07-29 MED ORDER — INSULIN ASPART 100 UNIT/ML ~~LOC~~ SOLN
3.0000 [IU] | Freq: Three times a day (TID) | SUBCUTANEOUS | Status: DC
Start: 2012-07-30 — End: 2012-07-30
  Administered 2012-07-30: 3 [IU] via SUBCUTANEOUS

## 2012-07-29 MED ORDER — SODIUM CHLORIDE 0.9 % IV SOLN
250.0000 mL | INTRAVENOUS | Status: DC | PRN
  Administered 2012-07-30: 250 mL via INTRAVENOUS

## 2012-07-29 MED ORDER — ONDANSETRON HCL 4 MG PO TABS: 4.0000 mg | ORAL_TABLET | Freq: Four times a day (QID) | ORAL | Status: DC | PRN

## 2012-07-29 MED ORDER — FINASTERIDE 5 MG PO TABS
5.0000 mg | ORAL_TABLET | Freq: Every day | ORAL | Status: DC
  Administered 2012-07-30: 5 mg via ORAL
  Filled 2012-07-29: qty 1

## 2012-07-29 MED ORDER — BENAZEPRIL HCL 10 MG PO TABS
10.0000 mg | ORAL_TABLET | Freq: Every day | ORAL | Status: DC
  Administered 2012-07-30: 10 mg via ORAL
  Filled 2012-07-29: qty 1

## 2012-07-29 MED ORDER — EZETIMIBE 10 MG PO TABS
10.0000 mg | ORAL_TABLET | Freq: Every day | ORAL | Status: DC
  Administered 2012-07-29: 10 mg via ORAL
  Filled 2012-07-29 (×2): qty 1

## 2012-07-29 MED ORDER — METOPROLOL SUCCINATE ER 50 MG PO TB24
50.0000 mg | ORAL_TABLET | Freq: Every day | ORAL | Status: DC
  Administered 2012-07-30: 50 mg via ORAL
  Filled 2012-07-29: qty 1

## 2012-07-29 MED ORDER — SIMVASTATIN 10 MG PO TABS
10.0000 mg | ORAL_TABLET | Freq: Every day | ORAL | Status: DC
  Administered 2012-07-29: 10 mg via ORAL
  Filled 2012-07-29 (×2): qty 1

## 2012-07-29 MED ORDER — ENOXAPARIN SODIUM 40 MG/0.4ML ~~LOC~~ SOLN
40.0000 mg | SUBCUTANEOUS | Status: DC
  Administered 2012-07-29: 40 mg via SUBCUTANEOUS
  Filled 2012-07-29 (×2): qty 0.4

## 2012-07-29 MED ORDER — ASPIRIN EC 325 MG PO TBEC
325.0000 mg | DELAYED_RELEASE_TABLET | Freq: Every day | ORAL | Status: DC
  Filled 2012-07-29 (×2): qty 1

## 2012-07-29 MED ORDER — EZETIMIBE-SIMVASTATIN 10-10 MG PO TABS: 1.0000 | ORAL_TABLET | Freq: Every day | ORAL | Status: DC

## 2012-07-29 MED ORDER — ONDANSETRON HCL 4 MG/2ML IJ SOLN
4.0000 mg | Freq: Four times a day (QID) | INTRAMUSCULAR | Status: DC | PRN
  Administered 2012-07-30: 4 mg via INTRAVENOUS
  Filled 2012-07-29: qty 2

## 2012-07-29 MED ORDER — ALFUZOSIN HCL ER 10 MG PO TB24
10.0000 mg | ORAL_TABLET | Freq: Every day | ORAL | Status: DC
  Administered 2012-07-30: 10 mg via ORAL
  Filled 2012-07-29: qty 1

## 2012-07-29 MED ORDER — SODIUM CHLORIDE 0.9 % IJ SOLN: 3.0000 mL | INTRAMUSCULAR | Status: DC | PRN

## 2012-07-29 MED ORDER — FENTANYL CITRATE 0.05 MG/ML IJ SOLN
50.0000 ug | Freq: Once | INTRAMUSCULAR | Status: AC
  Administered 2012-07-29: 50 ug via INTRAVENOUS
  Filled 2012-07-29: qty 2

## 2012-07-29 MED ORDER — SODIUM CHLORIDE 0.9 % IJ SOLN
3.0000 mL | Freq: Two times a day (BID) | INTRAMUSCULAR | Status: DC
  Administered 2012-07-29 – 2012-07-30 (×2): 3 mL via INTRAVENOUS

## 2012-07-29 MED ORDER — MORPHINE SULFATE 2 MG/ML IJ SOLN
1.0000 mg | INTRAMUSCULAR | Status: DC | PRN
  Administered 2012-07-29 – 2012-07-30 (×3): 1 mg via INTRAVENOUS
  Filled 2012-07-29 (×3): qty 1

## 2012-07-29 MED ORDER — VITAMIN D (ERGOCALCIFEROL) 1.25 MG (50000 UNIT) PO CAPS: 50000.0000 [IU] | ORAL_CAPSULE | ORAL | Status: DC

## 2012-07-29 NOTE — H&P (Signed)
[facility]          History and Physical    PCP:   [name], MD   Chief Complaint:  Fall, left hip pain  HPI: Pleasant 79 y/o man who was at home this morning and slipped on the ice on the ramp to his house injuring his left hip. No LOC. Daughter was present and confirms this was a mechanical fall. XRay in the ED confirms a left intertrochanteric hip fracture. Dr. [name] with ortho is planning on OR tomorrow afternoon. We have been asked to admit him for further evaluation and management.  Allergies:   Allergies  Allergen Reactions  . Atorvastatin Other (See Comments)    Muscle weakness   . Pravastatin Sodium Other (See Comments)    Muscle weakness   . Rosuvastatin Other (See Comments)    Muscle weakness       Past Medical History  Diagnosis Date  . Mixed hyperlipidemia   . Benign essential HTN   . CAD (coronary artery disease)     a. CABG 1983. b. Re-do CABG 1997. c. Grafts patent 2003.  . Diabetes mellitus     type II; controlled  . Hyperlipidemia   . Skin rash     of uncertain etiology    Past Surgical History  Procedure Laterality Date  . Left hydrocelectomy    . Fetal surgery for congenital hernia      Prior to Admission medications   Medication Sig Start Date End Date Taking? Authorizing Provider  alfuzosin (UROXATRAL) 10 MG 24 hr tablet Take 10 mg by mouth daily.     Yes Historical Provider, MD  aspirin EC 325 MG tablet Take 325 mg by mouth daily.     Yes Historical Provider, MD  benazepril (LOTENSIN) 10 MG tablet Take 1 tablet (10 mg total) by mouth daily. 07/22/12  Yes Wendall Stade, MD  ergocalciferol (VITAMIN D2) 50000 UNITS capsule Take 50,000 Units by mouth every 14 (fourteen) days. 1st and 15th of each month   Yes Historical Provider, MD  ezetimibe-simvastatin (VYTORIN) 10-10 MG per tablet Take 1 tablet by mouth at bedtime. 04/15/12  Yes Wendall Stade, MD  finasteride (PROSCAR) 5 MG tablet Take 5 mg by mouth daily.      Yes Historical Provider, MD  glyBURIDE-metformin (GLUCOVANCE) 5-500 MG per tablet Take 2 tablets by mouth daily with breakfast.    Yes Historical Provider, MD  metoprolol succinate (TOPROL XL) 50 MG 24 hr tablet Take 1 tablet (50 mg total) by mouth daily. 07/22/12 07/22/13 Yes Wendall Stade, MD    Social History:  reports that he has quit smoking. He does not have any smokeless tobacco history on file. He reports that he does not drink alcohol or use illicit drugs.  No family history on file.  Review of Systems:  Constitutional: Denies fever, chills, diaphoresis, appetite change and fatigue.  HEENT: Denies photophobia, eye pain, redness, hearing loss, ear pain, congestion, sore throat, rhinorrhea, sneezing, mouth sores, trouble swallowing, neck pain, neck stiffness and tinnitus.   Respiratory: Denies SOB, DOE, cough, chest tightness,  and wheezing.   Cardiovascular: Denies chest pain, palpitations and leg swelling.  Gastrointestinal: Denies nausea, vomiting, abdominal pain, diarrhea, constipation, blood in stool and abdominal distention.  Genitourinary: Denies dysuria, urgency, frequency, hematuria, flank pain and difficulty urinating.  Musculoskeletal: Denies myalgias, back pain, joint swelling, arthralgias and gait problem.  Skin: Denies pallor, rash and wound.  Neurological: Denies dizziness, seizures, syncope, weakness, light-headedness, numbness and  headaches.  Hematological: Denies adenopathy. Easy bruising, personal or family bleeding history  Psychiatric/Behavioral: Denies suicidal ideation, mood changes, confusion, nervousness, sleep disturbance and agitation   Physical Exam: Blood pressure 137/49, pulse 69, temperature 97.4 F (36.3 C), temperature source Oral, resp. rate 20, SpO2 100.00%. Gen: AA Ox3, NAD HEENT: [place], AT ,PERRL, EOMI Neck: supple, no JVD, no LAD, no bruits, no goiter. CV: RRR Lungs: CTA B Abd: S/NT/ND/+BS/no masses or organomegaly noted. Ext: trace bilateral  edema, +pulses. Neuro: grossly intact and non-focal. I have not ambulated him.  Labs on Admission:  Results for orders placed during the hospital encounter of 07/29/12 (from the past 48 hour(s))  COMPREHENSIVE METABOLIC PANEL     Status: Abnormal   Collection Time    07/29/12  3:18 PM      Result Value Range   Sodium 135  135 - 145 mEq/L   Potassium 5.2 (*) 3.5 - 5.1 mEq/L   Chloride 100  96 - 112 mEq/L   CO2 27  19 - 32 mEq/L   Glucose, Bld 209 (*) 70 - 99 mg/dL   BUN 20  6 - 23 mg/dL   Creatinine, Ser 1.61  0.50 - 1.35 mg/dL   Calcium 8.9  8.4 - 09.6 mg/dL   Total Protein 6.6  6.0 - 8.3 g/dL   Albumin 3.7  3.5 - 5.2 g/dL   AST 17  0 - 37 U/L   ALT 17  0 - 53 U/L   Alkaline Phosphatase 84  39 - 117 U/L   Total Bilirubin 0.4  0.3 - 1.2 mg/dL   GFR calc non Af Amer 77 (*) >90 mL/min   GFR calc Af Amer 90 (*) >90 mL/min   Comment:            The eGFR has been calculated     using the CKD EPI equation.     This calculation has not been     validated in all clinical     situations.     eGFR's persistently     <90 mL/min signify     possible Chronic Kidney Disease.  CBC WITH DIFFERENTIAL     Status: Abnormal   Collection Time    07/29/12  3:18 PM      Result Value Range   WBC 11.9 (*) 4.0 - 10.5 K/uL   RBC 3.99 (*) 4.22 - 5.81 MIL/uL   Hemoglobin 12.4 (*) 13.0 - 17.0 g/dL   HCT 04.5 (*) 40.9 - 81.1 %   MCV 88.0  78.0 - 100.0 fL   MCH 31.1  26.0 - 34.0 pg   MCHC 35.3  30.0 - 36.0 g/dL   RDW 91.4  78.2 - 95.6 %   Platelets 163  150 - 400 K/uL   Neutrophils Relative 88 (*) 43 - 77 %   Neutro Abs 10.5 (*) 1.7 - 7.7 K/uL   Lymphocytes Relative 6 (*) 12 - 46 %   Lymphs Abs 0.8  0.7 - 4.0 K/uL   Monocytes Relative 5  3 - 12 %   Monocytes Absolute 0.6  0.1 - 1.0 K/uL   Eosinophils Relative 0  0 - 5 %   Eosinophils Absolute 0.1  0.0 - 0.7 K/uL   Basophils Relative 0  0 - 1 %   Basophils Absolute 0.0  0.0 - 0.1 K/uL  PROTIME-INR     Status: None   Collection Time     07/29/12  3:18 PM  Result Value Range   Prothrombin Time 13.2  11.6 - 15.2 seconds   INR 1.01  0.00 - 1.49  APTT     Status: None   Collection Time    07/29/12  3:18 PM      Result Value Range   aPTT 31  24 - 37 seconds  GLUCOSE, CAPILLARY     Status: Abnormal   Collection Time    07/29/12  5:16 PM      Result Value Range   Glucose-Capillary 197 (*) 70 - 99 mg/dL    Radiological Exams on Admission: Dg Chest 1 View  07/29/2012  *RADIOLOGY REPORT*  Clinical Data: Preop left hip fracture  CHEST - 1 VIEW  Comparison: 08/01/2010  Findings: Lungs are clear. No pleural effusion or pneumothorax.  Heart is top normal in size. Postsurgical changes related to prior CABG.  IMPRESSION: No evidence of acute cardiopulmonary disease.   Original Report Authenticated By: Charline Bills, M.D.    Dg Hip Complete Left  07/29/2012  *RADIOLOGY REPORT*  Clinical Data: Fall  LEFT HIP - COMPLETE 2+ VIEW  Comparison: None.  Findings: Intertrochanteric fracture of the left hip with angulation and mild displacement.  Mild degenerative change in the left hip joint.  IMPRESSION: Intertrochanteric fracture left femur.   Original Report Authenticated By: Janeece Riggers, M.D.     Assessment/Plan Principal Problem:   Hip fracture, left Active Problems:   DIABETES MELLITUS, TYPE II, CONTROLLED   HYPERLIPIDEMIA-MIXED   HYPERTENSION, BENIGN   CAD, AUTOLOGOUS BYPASS GRAFT   Chronic systolic CHF (congestive heart failure)    Left Hip Fracture -Planned for repair tomorrow by Dr. [name]. -Will need cardiac pre-op clearance given his significant heart issues. -[facility] cards has been consulted (Dr. Jens Som).  DM -Check A1C. -SSI.  Hyperlipidemia -Continue vytorin.  Chronic Systolic CHF -Last known EF 41%. -Appears compensated at this time.  DVT Prophylaxis -Lovenox.   Time Spent on Admission: 75 minutes.  Chaya Jan [facility] Pager: [phone] 07/29/2012, 6:46 PM

## 2012-07-29 NOTE — ED Notes (Signed)
Pt noted to be coughing during eating.  Pt states he feel that there is something stuck in his throat.  Pt given water.  Pt continuing to cough with some clear emesis.  Lungs listened to, lungs sound clear.   Pt sat up in bed.

## 2012-07-29 NOTE — ED Notes (Signed)
Heart healthy diet tray ordered.  Spoke with Kenney Houseman from service response.

## 2012-07-29 NOTE — ED Provider Notes (Signed)
History     CSN: [id number]  Arrival date & time 07/29/12  1418   First MD Initiated Contact with Patient 07/29/12 [phone]      Chief Complaint  Patient presents with  . Fall  . Hip Pain  . Shoulder Pain    (Consider location/radiation/quality/duration/timing/severity/associated sxs/prior treatment) HPI Pt with slip an fall on ice landing on left hip at 1100. Pt denies head or neck injury. Pt states he has been unable to weight bear. No numbness. Pain with ROM of hip. No other complaints.  Past Medical History  Diagnosis Date  . Mixed hyperlipidemia   . Benign essential HTN   . CAD (coronary artery disease)     autologous bypass graft  . Diabetes mellitus     type II; controlled  . Hyperlipidemia   . Skin rash     of uncertain etiology    Past Surgical History  Procedure Laterality Date  . Left hydrocelectomy      No family history on file.  History  Substance Use Topics  . Smoking status: Former Games developer  . Smokeless tobacco: Not on file  . Alcohol Use: No      Review of Systems  Constitutional: Negative for fever and chills.  HENT: Negative for neck pain.   Respiratory: Negative for shortness of breath.   Cardiovascular: Negative for chest pain.  Gastrointestinal: Negative for nausea, vomiting and abdominal pain.  Musculoskeletal: Positive for arthralgias.  Skin: Negative for rash and wound.  Neurological: Negative for dizziness, syncope, weakness, light-headedness and headaches.  All other systems reviewed and are negative.    Allergies  Atorvastatin; Pravastatin sodium; and Rosuvastatin  Home Medications   Current Outpatient Rx  Name  Route  Sig  Dispense  Refill  . alfuzosin (UROXATRAL) 10 MG 24 hr tablet   Oral   Take 10 mg by mouth daily.           Marland Kitchen aspirin EC 325 MG tablet   Oral   Take 325 mg by mouth daily.           . benazepril (LOTENSIN) 10 MG tablet   Oral   Take 1 tablet (10 mg total) by mouth daily.   90 tablet   3   .  ergocalciferol (VITAMIN D2) 50000 UNITS capsule   Oral   Take 50,000 Units by mouth every 14 (fourteen) days. 1st and 15th of each month         . ezetimibe-simvastatin (VYTORIN) 10-10 MG per tablet   Oral   Take 1 tablet by mouth at bedtime.   30 tablet   0   . finasteride (PROSCAR) 5 MG tablet   Oral   Take 5 mg by mouth daily.           Marland Kitchen glyBURIDE-metformin (GLUCOVANCE) 5-500 MG per tablet   Oral   Take 2 tablets by mouth daily with breakfast.          . metoprolol succinate (TOPROL XL) 50 MG 24 hr tablet   Oral   Take 1 tablet (50 mg total) by mouth daily.   90 tablet   3     BP 134/56  Pulse 68  Temp(Src) 97.4 F (36.3 C) (Oral)  Resp 20  SpO2 99%  Physical Exam  Nursing note and vitals reviewed. Constitutional: He is oriented to person, place, and time. He appears well-developed and well-nourished. No distress.  HENT:  Head: Normocephalic and atraumatic.  Mouth/Throat: Oropharynx is clear and moist.  Eyes: EOM are normal. Pupils are equal, round, and reactive to light.  Neck: Normal range of motion. Neck supple.  No posterior cervical midline TTP  Cardiovascular: Normal rate and regular rhythm.   Pulmonary/Chest: Effort normal and breath sounds normal. No respiratory distress. He has no wheezes. He has no rales. He exhibits no tenderness.  Abdominal: Soft. Bowel sounds are normal. He exhibits no distension and no mass. There is no tenderness. There is no rebound and no guarding.  Musculoskeletal: He exhibits tenderness. He exhibits no edema.  Hematoma vs deformity to Left lateral hip. Decreased ROM. TTP. 2+ DP left foot. FROM and no pain in all other joints.   Neurological: He is alert and oriented to person, place, and time.  Sensation intact. Motor intact in all ext.   Skin: Skin is warm and dry. No rash noted. No erythema.  Psychiatric: He has a normal mood and affect. His behavior is normal.    ED Course  Procedures (including critical care  time)  Labs Reviewed  COMPREHENSIVE METABOLIC PANEL - Abnormal; Notable for the following:    Potassium 5.2 (*)    Glucose, Bld 209 (*)    GFR calc non Af Amer 77 (*)    GFR calc Af Amer 90 (*)    All other components within normal limits  CBC WITH DIFFERENTIAL - Abnormal; Notable for the following:    WBC 11.9 (*)    RBC 3.99 (*)    Hemoglobin 12.4 (*)    HCT 35.1 (*)    Neutrophils Relative 88 (*)    Neutro Abs 10.5 (*)    Lymphocytes Relative 6 (*)    All other components within normal limits  PROTIME-INR  APTT   Dg Chest 1 View  07/29/2012  *RADIOLOGY REPORT*  Clinical Data: Preop left hip fracture  CHEST - 1 VIEW  Comparison: 08/01/2010  Findings: Lungs are clear. No pleural effusion or pneumothorax.  Heart is top normal in size. Postsurgical changes related to prior CABG.  IMPRESSION: No evidence of acute cardiopulmonary disease.   Original Report Authenticated By: Charline Bills, M.D.    Dg Hip Complete Left  07/29/2012  *RADIOLOGY REPORT*  Clinical Data: Fall  LEFT HIP - COMPLETE 2+ VIEW  Comparison: None.  Findings: Intertrochanteric fracture of the left hip with angulation and mild displacement.  Mild degenerative change in the left hip joint.  IMPRESSION: Intertrochanteric fracture left femur.   Original Report Authenticated By: Janeece Riggers, M.D.      1. Trochanteric fracture, left, closed, initial encounter      Date: 07/29/2012  Rate: 64  Rhythm: normal sinus rhythm  QRS Axis: normal  Intervals: normal  ST/T Wave abnormalities: normal  Conduction Disutrbances:none  Narrative Interpretation:   Old EKG Reviewed: none available    MDM   Dr [name] will see in AM and plan on repair late tomorrow. NPO after clear liquid breakfast tomorrow. Dr Ardyth Harps of [facility] will admit.        Loren Racer, MD 07/29/12 1705

## 2012-07-29 NOTE — ED Notes (Signed)
Pt slipped and fell on ice this am and fell on his left side.  NO LOC.  Pt has left shoulder, left ribs , and left leg pain.

## 2012-07-29 NOTE — ED Notes (Signed)
[phone], Luster Landsberg, daughter would like updates.

## 2012-07-29 NOTE — Consult Note (Signed)
Reason for Consult:left comminuted interthrochanteric hip fracture Referring Physician: Dr. Philip Aspen  REMIJIO White is an 79 y.o. male.  HPI:  Slipped on ice today on ramp outside his house with left IT hip fracture. Closed injury.  EMS to ER.   Past Medical History  Diagnosis Date  . Mixed hyperlipidemia   . Benign essential HTN   . CAD (coronary artery disease)     a. CABG 1983. b. Re-do CABG 1997. c. Grafts patent 2003.  . Diabetes mellitus     type II; controlled  . Hyperlipidemia   . Skin rash     of uncertain etiology  . Prostate enlargement     Past Surgical History  Procedure Laterality Date  . Left hydrocelectomy    . Fetal surgery for congenital hernia      No family history on file.  Social History:  reports that he has quit smoking. He does not have any smokeless tobacco history on file. He reports that he does not drink alcohol or use illicit drugs.  Allergies:  Allergies  Allergen Reactions  . Atorvastatin Other (See Comments)    Muscle weakness   . Pravastatin Sodium Other (See Comments)    Muscle weakness   . Rosuvastatin Other (See Comments)    Muscle weakness     Medications: I have reviewed the patient's current medications.  Results for orders placed during the hospital encounter of 07/29/12 (from the past 48 hour(s))  COMPREHENSIVE METABOLIC PANEL     Status: Abnormal   Collection Time    07/29/12  3:18 PM      Result Value Range   Sodium 135  135 - 145 mEq/L   Potassium 5.2 (*) 3.5 - 5.1 mEq/L   Chloride 100  96 - 112 mEq/L   CO2 27  19 - 32 mEq/L   Glucose, Bld 209 (*) 70 - 99 mg/dL   BUN 20  6 - 23 mg/dL   Creatinine, Ser 1.61  0.50 - 1.35 mg/dL   Calcium 8.9  8.4 - 09.6 mg/dL   Total Protein 6.6  6.0 - 8.3 g/dL   Albumin 3.7  3.5 - 5.2 g/dL   AST 17  0 - 37 U/L   ALT 17  0 - 53 U/L   Alkaline Phosphatase 84  39 - 117 U/L   Total Bilirubin 0.4  0.3 - 1.2 mg/dL   GFR calc non Af Amer 77 (*) >90 mL/min   GFR calc Af Amer  90 (*) >90 mL/min   Comment:            The eGFR has been calculated     using the CKD EPI equation.     This calculation has not been     validated in all clinical     situations.     eGFR's persistently     <90 mL/min signify     possible Chronic Kidney Disease.  CBC WITH DIFFERENTIAL     Status: Abnormal   Collection Time    07/29/12  3:18 PM      Result Value Range   WBC 11.9 (*) 4.0 - 10.5 K/uL   RBC 3.99 (*) 4.22 - 5.81 MIL/uL   Hemoglobin 12.4 (*) 13.0 - 17.0 g/dL   HCT 04.5 (*) 40.9 - 81.1 %   MCV 88.0  78.0 - 100.0 fL   MCH 31.1  26.0 - 34.0 pg   MCHC 35.3  30.0 - 36.0 g/dL   RDW 13.6  11.5 - 15.5 %   Platelets 163  150 - 400 K/uL   Neutrophils Relative 88 (*) 43 - 77 %   Neutro Abs 10.5 (*) 1.7 - 7.7 K/uL   Lymphocytes Relative 6 (*) 12 - 46 %   Lymphs Abs 0.8  0.7 - 4.0 K/uL   Monocytes Relative 5  3 - 12 %   Monocytes Absolute 0.6  0.1 - 1.0 K/uL   Eosinophils Relative 0  0 - 5 %   Eosinophils Absolute 0.1  0.0 - 0.7 K/uL   Basophils Relative 0  0 - 1 %   Basophils Absolute 0.0  0.0 - 0.1 K/uL  PROTIME-INR     Status: None   Collection Time    07/29/12  3:18 PM      Result Value Range   Prothrombin Time 13.2  11.6 - 15.2 seconds   INR 1.01  0.00 - 1.49  APTT     Status: None   Collection Time    07/29/12  3:18 PM      Result Value Range   aPTT 31  24 - 37 seconds  GLUCOSE, CAPILLARY     Status: Abnormal   Collection Time    07/29/12  5:16 PM      Result Value Range   Glucose-Capillary 197 (*) 70 - 99 mg/dL  CBC     Status: Abnormal   Collection Time    07/29/12  8:39 PM      Result Value Range   WBC 10.2  4.0 - 10.5 K/uL   RBC 3.76 (*) 4.22 - 5.81 MIL/uL   Hemoglobin 11.4 (*) 13.0 - 17.0 g/dL   HCT 16.1 (*) 09.6 - 04.5 %   MCV 86.7  78.0 - 100.0 fL   MCH 30.3  26.0 - 34.0 pg   MCHC 35.0  30.0 - 36.0 g/dL   RDW 40.9  81.1 - 91.4 %   Platelets 155  150 - 400 K/uL  CREATININE, SERUM     Status: Abnormal   Collection Time    07/29/12  8:39 PM       Result Value Range   Creatinine, Ser 0.69  0.50 - 1.35 mg/dL   GFR calc non Af Amer 82 (*) >90 mL/min   GFR calc Af Amer >90  >90 mL/min   Comment:            The eGFR has been calculated     using the CKD EPI equation.     This calculation has not been     validated in all clinical     situations.     eGFR's persistently     <90 mL/min signify     possible Chronic Kidney Disease.  GLUCOSE, CAPILLARY     Status: Abnormal   Collection Time    07/29/12  9:38 PM      Result Value Range   Glucose-Capillary 160 (*) 70 - 99 mg/dL    Dg Chest 1 View  7/82/9562  *RADIOLOGY REPORT*  Clinical Data: Preop left hip fracture  CHEST - 1 VIEW  Comparison: 08/01/2010  Findings: Lungs are clear. No pleural effusion or pneumothorax.  Heart is top normal in size. Postsurgical changes related to prior CABG.  IMPRESSION: No evidence of acute cardiopulmonary disease.   Original Report Authenticated By: Charline Bills, M.D.    Dg Hip Complete Left  07/29/2012  *RADIOLOGY REPORT*  Clinical Data: Fall  LEFT HIP - COMPLETE 2+ VIEW  Comparison: None.  Findings: Intertrochanteric fracture of the left hip with angulation and mild displacement.  Mild degenerative change in the left hip joint.  IMPRESSION: Intertrochanteric fracture left femur.   Original Report Authenticated By: Janeece Riggers, M.D.     Review of Systems  Respiratory: Negative.   Cardiovascular: Negative for chest pain, palpitations and orthopnea.       Previous bypass  Gastrointestinal: Negative.   Genitourinary: Positive for frequency.  Neurological: Negative.   Endo/Heme/Allergies: Negative.   Psychiatric/Behavioral: Negative.    Blood pressure 104/40, pulse 80, temperature 98.1 F (36.7 C), temperature source Oral, resp. rate 18, SpO2 98.00%. Physical Exam  Constitutional: He is oriented to person, place, and time. He appears well-developed and well-nourished.  HENT:  Head: Normocephalic.  Eyes: Pupils are equal, round, and  reactive to light.  Neck: Normal range of motion.  Cardiovascular: Normal rate.   Respiratory: Effort normal.  GI: Soft.  Musculoskeletal:  Left LE short and ER, pulses and sciatic sensation intact  Neurological: He is alert and oriented to person, place, and time.  Skin: Skin is warm and dry.  Psychiatric: He has a normal mood and affect. His behavior is normal. Judgment and thought content normal.    Assessment/Plan: Left IT Hip Fracture.    Lives with his wife, he does the cooking. Daughter also in town.   Plan surgery Tuesday 7PM    Discussed risks , options, he understands and agrees to proceed.   Jametta Moorehead C 07/29/2012, 10:49 PM

## 2012-07-29 NOTE — Consult Note (Signed)
CARDIOLOGY CONSULT NOTE  Patient ID: KADRIAN PARTCH, MRN: [id number], DOB/AGE: 1922/01/04 79 y.o. Admit date: 07/29/2012   Date of Consult: 07/29/2012 Primary Physician: [name], MD Primary Cardiologist: Eden Emms  Chief Complaint: hip pain Reason for Consult: pre-op clearance for hip surgery  HPI: Mr. Lofgren is an 79 y/o M with history of CAD s/p CABG with redo 1997, patent grafts 2003, (MV 06/2010 with scar but no ischemia & EF 41%), DM, HTN, HL who presented to [facility] today s/p mechanical fall on ice. He fell on his L hip and sustained a L hip fracture. He did not hit his head or injury himself anywhere else. He was walking outside this morning, lost his balance, and fell. He denies any syncope, dizziness, or palpitations. He ambulates chronically with a cane due to balance and leg weakness. He denies any SOB at rest or exertion, orthopnea, LEE, or chest pain at rest/exertion. Labs significant for WBC 11.9, Hgb 12.4, K 5.2, glu elevated, CXR nonacute.   Past Medical History  Diagnosis Date  . Mixed hyperlipidemia   . Benign essential HTN   . CAD (coronary artery disease)     a. CABG 1983. b. Re-do CABG 1997. c. Grafts patent 2003.  . Diabetes mellitus     type II; controlled  . Hyperlipidemia   . Skin rash     of uncertain etiology      Most Recent Cardiac Studies: Nuclear Stress Test 06/2010 Overall Impression  Exercise Capacity: Lexiscan with no exercise.  BP Response: Normal blood pressure response.  Clinical Symptoms: No chest pain  ECG Impression: No significant ST segment change suggestive of ischemia.  Overall Impression Comments: Scar in the anterior (mid, distal), distal anteroseptal, distal anterolateral and apical walls. No evidence of ischemia. LVEF 41%  Appended Document: Cardiology Nuclear Testing  ok for surgery  Appended Document: Cardiology Nuclear Testing  report faxed to dr cornett  Cardiac Cath 2003 PROCEDURES PERFORMED:  1. Left  heart catheterization.  2. Selective coronary angiography.  3. Vein graft angiography.  4. Left internal mammary artery graft angiography.  5. Distal aortography.  DESCRIPTION OF PROCEDURE: The procedure was performed via the right femoral  artery using an arterial sheath and 6 French preformed coronary catheters.  A front wall arterial puncture was performed and Omnipaque contrast was  used. JR4 catheters were used for injection of the vein graft. A LIMA  catheter was used for injection of the LIMA graft. A distal aortogram was  performed to rule out abdominal aortic aneurysm. The patient tolerated the  procedure well and left the laboratory in satisfactory condition.  RESULTS: The aortic pressure was 118/57 with a mean of 81 and left  ventricular pressure was 118/8.  The left main coronary artery: The left main coronary artery had a 90%  distal stenosis.  Left anterior descending: The left anterior descending artery was  completely occluded after a septal perforator.  Circumflex artery: The circumflex artery was completely occluded after a  small atrium, a small marginal branch.  Right coronary artery: The right coronary had multiple 95% proximal mid and  distal stenosis with competing flow distally with the vein graft.  The saphenous vein graft to the posterior descending branch of the right  coronary artery was patent and functioned normally. There was 70% narrowing  in the proximal portion of the posterior descending branch and there was 95%  narrowing in the AV portion of the right coronary artery distal to the  posterior  descending branch before a posterolateral branch, both of which  threatened the posterolateral branch.  The saphenous vein graft to the marginal branch of the circumflex artery was  patent. There was 30% distal stenosis with some irregularities but there  was no significant distal disease in the native vessel.  The LIMA graft to the LAD was patent and functioned  normally. There was 50%  narrowing in the mid to distal LAD after this insertion site.  LEFT VENTRICULOGRAPHY: The left ventriculogram performed in the RAO  projection showed hypokinesis to akinesis to the apex. The overall wall  motion was fairly good. Estimated ejection fraction was 50%.  DISTAL AORTOGRAM: A distal aortogram was performed which showed patent  renal arteries and no significant iliac obstruction.  CONCLUSIONS:  1. Coronary artery disease, status post re-do coronary artery bypass graft  surgery in 1997.  2. Severe native disease with 90% stenosis of the left main, total occlusion  of the left anterior descending and circumflex arteries and multiple 90-  95% stenoses in the right coronary artery.  3. Patent saphenous vein graft to the posterior descending branch to the  right coronary artery with 95% stenosis distal to the graft insertion  site threatening the posterolateral branch, patent vein graft to the  marginal branch of the circumflex artery, with 30% narrowing in the  distal portion of the graft, and a patent left internal mammary artery  graft to the left anterior descending with 50% narrowing in the left  anterior descending distal to the graft insertion site (the vein grafts  are old from his first bypass surgery and the left internal mammary  artery graft is new from the recent bypass surgery).  4. Apical wall hypo to akinesis.  RECOMMENDATIONS: The patient has potential for ischemia in the  posterolateral branch to the right coronary artery. He is not having any  symptoms in this area, did not show up as positive on his scan. We will  plan continued medical management. We will have him see a physician  assistant in 1-2 weeks for a groin check and I will see him back in a year.   Surgical History:  Past Surgical History  Procedure Laterality Date  . Left hydrocelectomy    . Fetal surgery for congenital hernia       Home Meds: Prior to Admission  medications   Medication Sig Start Date End Date Taking? Authorizing Provider  alfuzosin (UROXATRAL) 10 MG 24 hr tablet Take 10 mg by mouth daily.     Yes Historical Provider, MD  aspirin EC 325 MG tablet Take 325 mg by mouth daily.     Yes Historical Provider, MD  benazepril (LOTENSIN) 10 MG tablet Take 1 tablet (10 mg total) by mouth daily. 07/22/12  Yes Wendall Stade, MD  ergocalciferol (VITAMIN D2) 50000 UNITS capsule Take 50,000 Units by mouth every 14 (fourteen) days. 1st and 15th of each month   Yes Historical Provider, MD  ezetimibe-simvastatin (VYTORIN) 10-10 MG per tablet Take 1 tablet by mouth at bedtime. 04/15/12  Yes Wendall Stade, MD  finasteride (PROSCAR) 5 MG tablet Take 5 mg by mouth daily.     Yes Historical Provider, MD  glyBURIDE-metformin (GLUCOVANCE) 5-500 MG per tablet Take 2 tablets by mouth daily with breakfast.    Yes Historical Provider, MD  metoprolol succinate (TOPROL XL) 50 MG 24 hr tablet Take 1 tablet (50 mg total) by mouth daily. 07/22/12 07/22/13 Yes Wendall Stade, MD    Inpatient  Medications:    Allergies:  Allergies  Allergen Reactions  . Atorvastatin Other (See Comments)    Muscle weakness   . Pravastatin Sodium Other (See Comments)    Muscle weakness   . Rosuvastatin Other (See Comments)    Muscle weakness     History   Social History  . Marital Status: Married    Spouse Name: N/A    Number of Children: N/A  . Years of Education: N/A   Occupational History  . retired    Social History Main Topics  . Smoking status: Former Games developer  . Smokeless tobacco: Not on file  . Alcohol Use: No  . Drug Use: No  . Sexually Active: Not on file   Other Topics Concern  . Not on file   Social History Narrative  . No narrative on file     Family History: no premature CAD  Review of Systems: General: negative for chills, fever, night sweats Cardiovascular: see above Dermatological: negative for rash Respiratory: negative for cough or  wheezing Urologic: negative for hematuria Abdominal: negative for diarrhea, bright red blood per rectum, melena, or hematemesis. Has had some nausea. Neurologic: negative for visual changes, syncope, or dizziness All other systems reviewed and are otherwise negative except as noted above.  Labs:  Lab Results  Component Value Date   WBC 11.9* 07/29/2012   HGB 12.4* 07/29/2012   HCT 35.1* 07/29/2012   MCV 88.0 07/29/2012   PLT 163 07/29/2012    Recent Labs Lab 07/29/12 1518  NA 135  K 5.2*  CL 100  CO2 27  BUN 20  CREATININE 0.80  CALCIUM 8.9  PROT 6.6  BILITOT 0.4  ALKPHOS 84  ALT 17  AST 17  GLUCOSE 209*   Radiology/Studies:  Dg Chest 1 View 07/29/2012  *RADIOLOGY REPORT*  Clinical Data: Preop left hip fracture  CHEST - 1 VIEW  Comparison: 08/01/2010  Findings: Lungs are clear. No pleural effusion or pneumothorax.  Heart is top normal in size. Postsurgical changes related to prior CABG.  IMPRESSION: No evidence of acute cardiopulmonary disease.   Original Report Authenticated By: Charline Bills, M.D.    Dg Hip Complete Left 07/29/2012  *RADIOLOGY REPORT*  Clinical Data: Fall  LEFT HIP - COMPLETE 2+ VIEW  Comparison: None.  Findings: Intertrochanteric fracture of the left hip with angulation and mild displacement.  Mild degenerative change in the left hip joint.  IMPRESSION: Intertrochanteric fracture left femur.   Original Report Authenticated By: Janeece Riggers, M.D.     EKG: NSR 64bpm, PR 200, incomplete RBBB/LAFB (QRS 118), lateral infarct age indeterminate, nonspecific ST-T changes, TW avL  Physical Exam Blood pressure 137/49, pulse 69, temperature 97.4 F (36.3 C), temperature source Oral, resp. rate 20, SpO2 100.00%. General: Well developed, well nourished, in no acute distress. Head: Normocephalic, atraumatic  Neck: Negative for carotid bruits. JVD not elevated. Lungs: Mild diffuse rhonchi Heart: RRR with S1 S2. No murmurs, rubs, or gallops appreciated. Abdomen: Soft,  non-tender, non-distended with normoactive bowel sounds. No hepatomegaly. No rebound/guarding. No obvious abdominal masses. Extremities: No clubbing or cyanosis. No edema.  Distal pedal pulses are diminished bilaterally. Status post left femur fracture with lateral deviation. Neuro: Alert and oriented X 3. No facial asymmetry. No focal deficit.  Psych:  Responds to questions appropriately with a normal affect.   Assessment and Plan:  1. Mechanical fall on ice with resultant L hip fracture 2. CAD s/p CABG with redo 1997 3. Diabetes mellitus 4. HTN 5. Mild hyperkalemia  Pt seen and examined with Dr. Jens Som. See below for thoughts.  Signed, Dayna Dunn PA-C 07/29/2012, 6:40 PM  As above, patient seen and examined. Briefly he is an 79 year old male with a past medical history of coronary artery disease status post coronary artery bypass and graft with redo in 1997. His last nuclear study in February of 2012 showed an ejection fraction of 41%. There was scar but no ischemia. He also has diabetes, hypertension and hyperlipidemia. He typically walks with a cane but denies dyspnea, orthopnea, PND, pedal edema, syncope or exertional chest pain. The patient fell today losing his balance on ice. He suffered a left femur fracture and cardiology is asked to evaluate preoperatively. Electrocardiogram shows sinus rhythm, right bundle branch block, prior anterior and lateral infarct. Patient's risk we will be higher because of his age and prior cardiac history. However he has had no recent chest pain and a nuclear study in February 2012 showed no ischemia. Therefore no further cardiac workup is felt to be indicated preoperatively. He may proceed and continue his preadmission medications postoperatively. Continue aspirin, statin, beta blocker and ACE inhibitor. We will follow while in house. Olga Millers 7:19 PM

## 2012-07-30 ENCOUNTER — Encounter (HOSPITAL_COMMUNITY): Payer: Self-pay | Admitting: Anesthesiology

## 2012-07-30 ENCOUNTER — Inpatient Hospital Stay (HOSPITAL_COMMUNITY): Payer: Medicare Other

## 2012-07-30 ENCOUNTER — Encounter (HOSPITAL_COMMUNITY)
Admission: EM | Disposition: A | Discharge status: Skilled Nursing Facility | Payer: Self-pay | Source: Home / Self Care | Attending: Internal Medicine

## 2012-07-30 ENCOUNTER — Encounter (HOSPITAL_COMMUNITY): Payer: Self-pay | Admitting: Certified Registered Nurse Anesthetist

## 2012-07-30 ENCOUNTER — Inpatient Hospital Stay (HOSPITAL_COMMUNITY): Payer: Medicare Other | Admitting: Certified Registered Nurse Anesthetist

## 2012-07-30 DIAGNOSIS — I25810 Atherosclerosis of coronary artery bypass graft(s) without angina pectoris: Secondary | ICD-10-CM

## 2012-07-30 DIAGNOSIS — S72009D Fracture of unspecified part of neck of unspecified femur, subsequent encounter for closed fracture with routine healing: Secondary | ICD-10-CM

## 2012-07-30 HISTORY — PX: COMPRESSION HIP SCREW: SHX1386

## 2012-07-30 LAB — CBC
Hemoglobin: 11.1 g/dL — ABNORMAL LOW (ref 13.0–17.0)
MCH: 30.5 pg (ref 26.0–34.0)
MCV: 87.4 fL (ref 78.0–100.0)
RBC: 3.64 MIL/uL — ABNORMAL LOW (ref 4.22–5.81)
WBC: 7.9 10*3/uL (ref 4.0–10.5)

## 2012-07-30 LAB — BASIC METABOLIC PANEL
CO2: 26 mEq/L (ref 19–32)
Glucose, Bld: 183 mg/dL — ABNORMAL HIGH (ref 70–99)
Potassium: 4.7 mEq/L (ref 3.5–5.1)
Sodium: 138 mEq/L (ref 135–145)

## 2012-07-30 LAB — GLUCOSE, CAPILLARY
Glucose-Capillary: 159 mg/dL — ABNORMAL HIGH (ref 70–99)
Glucose-Capillary: 141 mg/dL — ABNORMAL HIGH (ref 70–99)

## 2012-07-30 LAB — MRSA PCR SCREENING: MRSA by PCR: NEGATIVE

## 2012-07-30 LAB — HEMOGLOBIN A1C: Hgb A1c MFr Bld: 6 % — ABNORMAL HIGH (ref <5.7)

## 2012-07-30 SURGERY — COMPRESSION HIP
Anesthesia: General | Site: Hip | Laterality: Left | Wound class: Clean

## 2012-07-30 MED ORDER — MORPHINE SULFATE 2 MG/ML IJ SOLN
0.5000 mg | INTRAMUSCULAR | Status: DC | PRN
  Administered 2012-07-30 (×2): 0.5 mg via INTRAVENOUS
  Filled 2012-07-30: qty 1

## 2012-07-30 MED ORDER — METHOCARBAMOL 500 MG PO TABS
500.0000 mg | ORAL_TABLET | Freq: Four times a day (QID) | ORAL | Status: DC | PRN
  Administered 2012-07-31: 500 mg via ORAL
  Filled 2012-07-30: qty 1

## 2012-07-30 MED ORDER — DEXTROSE-NACL 5-0.9 % IV SOLN
INTRAVENOUS | Status: DC
  Administered 2012-07-30: 23:00:00 via INTRAVENOUS

## 2012-07-30 MED ORDER — ALUM & MAG HYDROXIDE-SIMETH 200-200-20 MG/5ML PO SUSP: 30.0000 mL | ORAL | Status: DC | PRN

## 2012-07-30 MED ORDER — FENTANYL CITRATE 0.05 MG/ML IJ SOLN
25.0000 ug | INTRAMUSCULAR | Status: DC | PRN
  Administered 2012-07-31: 25 ug via INTRAVENOUS
  Filled 2012-07-30: qty 2

## 2012-07-30 MED ORDER — PROPOFOL INFUSION 10 MG/ML OPTIME
INTRAVENOUS | Status: DC | PRN
  Administered 2012-07-30: 75 ug/kg/min via INTRAVENOUS

## 2012-07-30 MED ORDER — ONDANSETRON HCL 4 MG/2ML IJ SOLN
INTRAMUSCULAR | Status: DC | PRN
  Administered 2012-07-30: 4 mg via INTRAVENOUS

## 2012-07-30 MED ORDER — ONDANSETRON HCL 4 MG PO TABS: 4.0000 mg | ORAL_TABLET | Freq: Four times a day (QID) | ORAL | Status: DC | PRN

## 2012-07-30 MED ORDER — PHENYLEPHRINE HCL 10 MG/ML IJ SOLN
10.0000 mg | INTRAVENOUS | Status: DC | PRN
  Administered 2012-07-30: 25 ug/min via INTRAVENOUS

## 2012-07-30 MED ORDER — ONDANSETRON HCL 4 MG/2ML IJ SOLN: 4.0000 mg | Freq: Four times a day (QID) | INTRAMUSCULAR | Status: DC | PRN

## 2012-07-30 MED ORDER — HYDROCODONE-ACETAMINOPHEN 5-325 MG PO TABS
1.0000 | ORAL_TABLET | Freq: Four times a day (QID) | ORAL | Status: DC | PRN
  Administered 2012-07-31 (×2): 2 via ORAL
  Administered 2012-08-01: 1 via ORAL
  Filled 2012-07-30 (×2): qty 2
  Filled 2012-07-30: qty 1

## 2012-07-30 MED ORDER — CEFAZOLIN SODIUM-DEXTROSE 2-3 GM-% IV SOLR
INTRAVENOUS | Status: DC | PRN
  Administered 2012-07-30: 2 g via INTRAVENOUS

## 2012-07-30 MED ORDER — METOCLOPRAMIDE HCL 5 MG/ML IJ SOLN: 5.0000 mg | Freq: Three times a day (TID) | INTRAMUSCULAR | Status: DC | PRN

## 2012-07-30 MED ORDER — ACETAMINOPHEN 325 MG PO TABS
650.0000 mg | ORAL_TABLET | Freq: Four times a day (QID) | ORAL | Status: DC | PRN
  Administered 2012-08-01: 650 mg via ORAL
  Filled 2012-07-30: qty 2

## 2012-07-30 MED ORDER — BISACODYL 10 MG RE SUPP: 10.0000 mg | Freq: Every day | RECTAL | Status: DC | PRN

## 2012-07-30 MED ORDER — PROPOFOL 10 MG/ML IV BOLUS
INTRAVENOUS | Status: DC | PRN
  Administered 2012-07-30: 40 mg via INTRAVENOUS

## 2012-07-30 MED ORDER — ACETAMINOPHEN 650 MG RE SUPP: 650.0000 mg | Freq: Four times a day (QID) | RECTAL | Status: DC | PRN

## 2012-07-30 MED ORDER — LACTATED RINGERS IV SOLN
INTRAVENOUS | Status: DC | PRN
  Administered 2012-07-30 (×2): via INTRAVENOUS

## 2012-07-30 MED ORDER — DOCUSATE SODIUM 100 MG PO CAPS
100.0000 mg | ORAL_CAPSULE | Freq: Two times a day (BID) | ORAL | Status: DC
  Administered 2012-07-30 – 2012-08-01 (×4): 100 mg via ORAL
  Filled 2012-07-30 (×4): qty 1

## 2012-07-30 MED ORDER — MENTHOL 3 MG MT LOZG: 1.0000 | LOZENGE | OROMUCOSAL | Status: DC | PRN

## 2012-07-30 MED ORDER — METHOCARBAMOL 100 MG/ML IJ SOLN: 500.0000 mg | Freq: Four times a day (QID) | INTRAVENOUS | Status: DC | PRN

## 2012-07-30 MED ORDER — CEFAZOLIN SODIUM-DEXTROSE 2-3 GM-% IV SOLR
2.0000 g | Freq: Once | INTRAVENOUS | Status: DC
  Filled 2012-07-30 (×2): qty 50

## 2012-07-30 MED ORDER — POLYETHYLENE GLYCOL 3350 17 G PO PACK: 17.0000 g | PACK | Freq: Every day | ORAL | Status: DC | PRN

## 2012-07-30 MED ORDER — BUPIVACAINE IN DEXTROSE 0.75-8.25 % IT SOLN
INTRATHECAL | Status: DC | PRN
  Administered 2012-07-30: 1.5 mL via INTRATHECAL

## 2012-07-30 MED ORDER — DEXTROSE 5 % IV SOLN
INTRAVENOUS | Status: DC | PRN
  Administered 2012-07-30: 19:00:00 via INTRAVENOUS

## 2012-07-30 MED ORDER — FENTANYL CITRATE 0.05 MG/ML IJ SOLN
INTRAMUSCULAR | Status: DC | PRN
  Administered 2012-07-30 (×2): 50 ug via INTRAVENOUS

## 2012-07-30 MED ORDER — METOCLOPRAMIDE HCL 10 MG PO TABS: 5.0000 mg | ORAL_TABLET | Freq: Three times a day (TID) | ORAL | Status: DC | PRN

## 2012-07-30 MED ORDER — ASPIRIN EC 325 MG PO TBEC
325.0000 mg | DELAYED_RELEASE_TABLET | Freq: Every day | ORAL | Status: DC
  Administered 2012-07-31 – 2012-08-01 (×2): 325 mg via ORAL
  Filled 2012-07-30 (×3): qty 1

## 2012-07-30 MED ORDER — PHENOL 1.4 % MT LIQD: 1.0000 | OROMUCOSAL | Status: DC | PRN

## 2012-07-30 MED ORDER — 0.9 % SODIUM CHLORIDE (POUR BTL) OPTIME
TOPICAL | Status: DC | PRN
  Administered 2012-07-30: 1000 mL

## 2012-07-30 MED ORDER — METOCLOPRAMIDE HCL 5 MG/ML IJ SOLN: 10.0000 mg | Freq: Once | INTRAMUSCULAR | Status: AC | PRN

## 2012-07-30 SURGICAL SUPPLY — 49 items
BANDAGE GAUZE ELAST BULKY 4 IN (GAUZE/BANDAGES/DRESSINGS) ×2 IMPLANT
BIT DRILL 4.3MMS DISTAL GRDTED (BIT) ×1 IMPLANT
BIT DRILL 4.8MMDIAX5IN DISPOSE (BIT) ×1 IMPLANT
BNDG COHESIVE 4X5 TAN STRL (GAUZE/BANDAGES/DRESSINGS) ×2 IMPLANT
CLOTH BEACON ORANGE TIMEOUT ST (SAFETY) ×2 IMPLANT
COVER SURGICAL LIGHT HANDLE (MISCELLANEOUS) ×2 IMPLANT
DECANTER SPIKE VIAL GLASS SM (MISCELLANEOUS) ×2 IMPLANT
DRAPE STERI IOBAN 125X83 (DRAPES) ×2 IMPLANT
DRILL 4.3MMS DISTAL GRADUATED (BIT) ×2
DRILL BIT 4.8MMDIAX5IN DISPOSE (BIT) ×2
DRSG MEPILEX BORDER 4X4 (GAUZE/BANDAGES/DRESSINGS) ×2 IMPLANT
DRSG MEPILEX BORDER 4X8 (GAUZE/BANDAGES/DRESSINGS) ×2 IMPLANT
DRSG PAD ABDOMINAL 8X10 ST (GAUZE/BANDAGES/DRESSINGS) ×2 IMPLANT
DURAPREP 26ML APPLICATOR (WOUND CARE) ×2 IMPLANT
ELECT CAUTERY BLADE 6.4 (BLADE) ×2 IMPLANT
ELECT REM PT RETURN 9FT ADLT (ELECTROSURGICAL) ×2
ELECTRODE REM PT RTRN 9FT ADLT (ELECTROSURGICAL) ×1 IMPLANT
EVACUATOR 1/8 PVC DRAIN (DRAIN) IMPLANT
GAUZE XEROFORM 1X8 LF (GAUZE/BANDAGES/DRESSINGS) ×2 IMPLANT
GLOVE BIOGEL PI IND STRL 7.5 (GLOVE) ×1 IMPLANT
GLOVE BIOGEL PI IND STRL 8 (GLOVE) ×1 IMPLANT
GLOVE BIOGEL PI INDICATOR 7.5 (GLOVE) ×1
GLOVE BIOGEL PI INDICATOR 8 (GLOVE) ×1
GLOVE ECLIPSE 7.0 STRL STRAW (GLOVE) ×2 IMPLANT
GLOVE ORTHO TXT STRL SZ7.5 (GLOVE) ×2 IMPLANT
GOWN PREVENTION PLUS LG XLONG (DISPOSABLE) IMPLANT
GOWN STRL NON-REIN LRG LVL3 (GOWN DISPOSABLE) ×6 IMPLANT
GUIDEPIN 3.2X17.5 THRD DISP (PIN) ×2 IMPLANT
GUIDEWIRE BALL NOSE 80CM (WIRE) ×2 IMPLANT
HFN LH 130 DEG 11MM X 380MM (Orthopedic Implant) ×2 IMPLANT
HIP FR NAIL LAG SCREW 10.5X110 (Orthopedic Implant) ×2 IMPLANT
KIT BASIN OR (CUSTOM PROCEDURE TRAY) ×2 IMPLANT
KIT ROOM TURNOVER OR (KITS) ×2 IMPLANT
MANIFOLD NEPTUNE II (INSTRUMENTS) ×2 IMPLANT
NEEDLE HYPO 25GX1X1/2 BEV (NEEDLE) ×2 IMPLANT
NS IRRIG 1000ML POUR BTL (IV SOLUTION) ×2 IMPLANT
PACK GENERAL/GYN (CUSTOM PROCEDURE TRAY) ×2 IMPLANT
PAD ARMBOARD 7.5X6 YLW CONV (MISCELLANEOUS) ×4 IMPLANT
SCREW LAG HIP FR NAIL 10.5X110 (Orthopedic Implant) ×1 IMPLANT
SPONGE GAUZE 4X4 12PLY (GAUZE/BANDAGES/DRESSINGS) ×2 IMPLANT
STAPLER VISISTAT 35W (STAPLE) ×2 IMPLANT
SUT VIC AB 0 CT1 27 (SUTURE) ×1
SUT VIC AB 0 CT1 27XBRD ANBCTR (SUTURE) ×1 IMPLANT
SUT VIC AB 2-0 CT1 27 (SUTURE) ×1
SUT VIC AB 2-0 CT1 TAPERPNT 27 (SUTURE) ×1 IMPLANT
SYR CONTROL 10ML LL (SYRINGE) ×2 IMPLANT
TOWEL OR 17X24 6PK STRL BLUE (TOWEL DISPOSABLE) ×2 IMPLANT
TOWEL OR 17X26 10 PK STRL BLUE (TOWEL DISPOSABLE) ×2 IMPLANT
WATER STERILE IRR 1000ML POUR (IV SOLUTION) ×2 IMPLANT

## 2012-07-30 NOTE — Progress Notes (Signed)
Agree with student dietitian note above.  Wt loss is concerning given pt's progressive loss without recent maintenance.  Pt agreeable to supplementation once PO diet resumed.  RD to follow for pt meeting needs. Note pt lives at home with wife and is responsible for cooking at home.  Loyce Dys, MS RD LDN Clinical Inpatient Dietitian Pager: [phone] Weekend/After hours pager: [phone]

## 2012-07-30 NOTE — Progress Notes (Signed)
Patient ID: Curtis White  male  BJY:[id number]    DOB: Mar 10, 1922    DOA: 07/29/2012  PCP: [name], MD  Assessment/Plan: Principal Problem:   Hip fracture, left - Planned surgery today, pain controlled - Cleared by cardiology  Active Problems:   DIABETES MELLITUS, TYPE II, CONTROLLED - Will keep sliding scale insulin while inpatient    HYPERLIPIDEMIA-MIXED - Continue Vytorin    HYPERTENSION, BENIGN: Stable    CAD, CABG,  Chronic systolic CHF (congestive heart failure)- compensated - Appreciate cardiology for preop clearance, currently stable for surgery today   DVT Prophylaxis:lovenox  Code Status: FC  Disposition:    Subjective: No specific complaints by the patient, pastor at bedside, awaiting surgery   Objective: Weight change:   Intake/Output Summary (Last 24 hours) at 07/30/12 1235 Last data filed at 07/30/12 0612  Gross per 24 hour  Intake      0 ml  Output    600 ml  Net   -600 ml   Blood pressure 163/99, pulse 79, temperature 98.4 F (36.9 C), temperature source Oral, resp. rate 18, height 5\' 10"  (1.778 m), weight 81.647 kg (180 lb), SpO2 97.00%.  Physical Exam: General: Alert and awake, oriented x3, not in any acute distress. CVS: S1-S2 clear, no murmur rubs or gallops Chest: clear to auscultation bilaterally, no wheezing, rales or rhonchi Abdomen: soft nontender, nondistended, normal bowel sounds Extremities: no cyanosis, clubbing or edema noted bilaterally, b/l SCD's   Lab Results: Basic Metabolic Panel:  Recent Labs Lab 07/29/12 1518 07/29/12 2039 07/30/12 0510  NA 135  --  138  K 5.2*  --  4.7  CL 100  --  103  CO2 27  --  26  GLUCOSE 209*  --  183*  BUN 20  --  17  CREATININE 0.80 0.69 0.69  CALCIUM 8.9  --  8.7   Liver Function Tests:  Recent Labs Lab 07/29/12 1518  AST 17  ALT 17  ALKPHOS 84  BILITOT 0.4  PROT 6.6  ALBUMIN 3.7     Recent Labs Lab 07/29/12 1518 07/29/12 2039 07/30/12 0510  WBC 11.9*  10.2 7.9  NEUTROABS 10.5*  --   --   HGB 12.4* 11.4* 11.1*  HCT 35.1* 32.6* 31.8*  MCV 88.0 86.7 87.4  PLT 163 155 155   CBG:  Recent Labs Lab 07/29/12 1716 07/29/12 2138 07/30/12 0648 07/30/12 1114  GLUCAP 197* 160* 159* 150*     Micro Results: Recent Results (from the past 240 hour(s))  MRSA PCR SCREENING     Status: None   Collection Time    07/30/12  6:42 AM      Result Value Range Status   MRSA by PCR NEGATIVE  NEGATIVE Final   Comment:            The GeneXpert MRSA Assay (FDA     approved for NASAL specimens     only), is one component of a     comprehensive MRSA colonization     surveillance program. It is not     intended to diagnose MRSA     infection nor to guide or     monitor treatment for     MRSA infections.    Studies/Results: Dg Chest 1 View  07/29/2012  *RADIOLOGY REPORT*  Clinical Data: Preop left hip fracture  CHEST - 1 VIEW  Comparison: 08/01/2010  Findings: Lungs are clear. No pleural effusion or pneumothorax.  Heart is top normal in size. Postsurgical  changes related to prior CABG.  IMPRESSION: No evidence of acute cardiopulmonary disease.   Original Report Authenticated By: Charline Bills, M.D.    Dg Hip Complete Left  07/29/2012  *RADIOLOGY REPORT*  Clinical Data: Fall  LEFT HIP - COMPLETE 2+ VIEW  Comparison: None.  Findings: Intertrochanteric fracture of the left hip with angulation and mild displacement.  Mild degenerative change in the left hip joint.  IMPRESSION: Intertrochanteric fracture left femur.   Original Report Authenticated By: Janeece Riggers, M.D.     Medications: Scheduled Meds: . alfuzosin  10 mg Oral Daily  . aspirin EC  325 mg Oral Daily  . benazepril  10 mg Oral Daily  .  ceFAZolin (ANCEF) IV  2 g Intravenous Once  . enoxaparin (LOVENOX) injection  40 mg Subcutaneous Q24H  . ezetimibe  10 mg Oral QHS   And  . simvastatin  10 mg Oral QHS  . finasteride  5 mg Oral Daily  . insulin aspart  0-9 Units Subcutaneous TID WC   . insulin aspart  3 Units Subcutaneous TID WC  . metoprolol succinate  50 mg Oral Daily  . sodium chloride  3 mL Intravenous Q12H  . [START ON 08/13/2012] Vitamin D (Ergocalciferol)  50,000 Units Oral Q14 Days      LOS: 1 day   RAI,RIPUDEEP M.D. [facility] 07/30/2012, 12:35 PM Pager: [phone]  If 7PM-7AM, please contact night-coverage [email or web address] Password [email or web address]

## 2012-07-30 NOTE — Progress Notes (Signed)
INITIAL NUTRITION ASSESSMENT  DOCUMENTATION CODES Per approved criteria  -Not Applicable   INTERVENTION: Glucerna Shake BID when diet advanced. Each supplement provides 220 kcal and 9.9 grams of protein.   NUTRITION DIAGNOSIS: Inadequate oral intake related to decreased appetite as evidenced by weight loss.   Goal: Pt to meet >/= 90% of estimated needs   Monitor:  Po intake, weight trends, labs (CBGs)   Reason for Assessment: Malnutrition Screening Tool (MST=4)  79 y.o. male  Admitting Dx: Hip fracture, left  ASSESSMENT: Pt is 79 yo male who slipped and fell on ice this am. Pt with resultant left hip fracture.  Pt with PMH of hyperlipidemia, HYT, CAD, DM.  While in ED, pt coughing during eating and felt as if there was something stuck in his throat. Pt with some clear emesis.   Dietetic Intern visited pt. Pt currently NPO and awaiting orthopedic surgery this afternoon.  Pt reported 30 lb weight loss over the course of a year in the malnutrition screening tool.  Pt confirmed a slow and steady weight loss over the past year but did not quantify a specific number. Pt reports decreased appetite and decreased intake now that he is responsible for the cooking. Pt lethargic and had some difficulty answering questions. Also of note, pt seems to still be experiencing nausea/ vomiting.  Pt stated that he would be agreeable to an oral nutrition supplement to help him meet his needs. RD to order supplement for when his diet is advanced.    Height: Ht Readings from Last 1 Encounters:  07/30/12 5\' 10"  (1.778 m)    Weight: Wt Readings from Last 1 Encounters:  07/30/12 180 lb (81.647 kg)    Ideal Body Weight: 166 lbs   % Ideal Body Weight: 108%  Wt Readings from Last 10 Encounters:  07/30/12 180 lb (81.647 kg)  07/30/12 180 lb (81.647 kg)  06/20/11 193 lb (87.544 kg)  01/02/11 193 lb (87.544 kg)  07/06/10 201 lb (91.173 kg)  07/04/10 197 lb (89.359 kg)  04/14/10 202 lb 8 oz  (91.853 kg)  05/13/09 205 lb 8 oz (93.214 kg)    Usual Body Weight: n/a   % Usual Body Weight: n/a  BMI:  Body mass index is 25.83 kg/(m^2). Overweight   Estimated Nutritional Needs: Kcal: 1950-2150 Protein: 90-105 gm Fluid: 1.8-2 L   Skin: intact, trace bilateral edema  Diet Order: NPO  EDUCATION NEEDS: -No education needs identified at this time   Intake/Output Summary (Last 24 hours) at 07/30/12 0829 Last data filed at 07/30/12 0612  Gross per 24 hour  Intake      0 ml  Output    600 ml  Net   -600 ml    Last BM: 07/29/2012   Labs:   Recent Labs Lab 07/29/12 1518 07/29/12 2039 07/30/12 0510  NA 135  --  138  K 5.2*  --  4.7  CL 100  --  103  CO2 27  --  26  BUN 20  --  17  CREATININE 0.80 0.69 0.69  CALCIUM 8.9  --  8.7  GLUCOSE 209*  --  183*    CBG (last 3)   Recent Labs  07/29/12 1716 07/29/12 2138 07/30/12 0648  GLUCAP 197* 160* 159*    Scheduled Meds: . alfuzosin  10 mg Oral Daily  . aspirin EC  325 mg Oral Daily  . benazepril  10 mg Oral Daily  .  ceFAZolin (ANCEF) IV  2 g Intravenous  Once  . enoxaparin (LOVENOX) injection  40 mg Subcutaneous Q24H  . ezetimibe  10 mg Oral QHS   And  . simvastatin  10 mg Oral QHS  . finasteride  5 mg Oral Daily  . insulin aspart  0-9 Units Subcutaneous TID WC  . insulin aspart  3 Units Subcutaneous TID WC  . metoprolol succinate  50 mg Oral Daily  . sodium chloride  3 mL Intravenous Q12H  . [START ON 08/13/2012] Vitamin D (Ergocalciferol)  50,000 Units Oral Q14 Days    Continuous Infusions:   Past Medical History  Diagnosis Date  . Mixed hyperlipidemia   . Benign essential HTN   . CAD (coronary artery disease)     a. CABG 1983. b. Re-do CABG 1997. c. Grafts patent 2003.  . Diabetes mellitus     type II; controlled  . Hyperlipidemia   . Skin rash     of uncertain etiology  . Prostate enlargement     Past Surgical History  Procedure Laterality Date  . Left hydrocelectomy    . Fetal  surgery for congenital hernia      Belenda Cruise  Dietetic Intern Pager: [phone]

## 2012-07-30 NOTE — Progress Notes (Signed)
Dr Gelene Mink at bedside-aware SBP 94-108/42 (pt was 104/40 preoop)  NEO drip shut off as per Dr Gelene Mink.  Will cont to monitor.

## 2012-07-30 NOTE — H&P (View-Only) (Signed)
Reason for Consult:left comminuted interthrochanteric hip fracture Referring Physician: Dr. Hernandez Acosta  Curtis White is an 79 y.o. male.  HPI:  Slipped on ice today on ramp outside his house with left IT hip fracture. Closed injury.  EMS to ER.   Past Medical History  Diagnosis Date  . Mixed hyperlipidemia   . Benign essential HTN   . CAD (coronary artery disease)     a. CABG 1983. b. Re-do CABG 1997. c. Grafts patent 2003.  . Diabetes mellitus     type II; controlled  . Hyperlipidemia   . Skin rash     of uncertain etiology  . Prostate enlargement     Past Surgical History  Procedure Laterality Date  . Left hydrocelectomy    . Fetal surgery for congenital hernia      No family history on file.  Social History:  reports that he has quit smoking. He does not have any smokeless tobacco history on file. He reports that he does not drink alcohol or use illicit drugs.  Allergies:  Allergies  Allergen Reactions  . Atorvastatin Other (See Comments)    Muscle weakness   . Pravastatin Sodium Other (See Comments)    Muscle weakness   . Rosuvastatin Other (See Comments)    Muscle weakness     Medications: I have reviewed the patient's current medications.  Results for orders placed during the hospital encounter of 07/29/12 (from the past 48 hour(s))  COMPREHENSIVE METABOLIC PANEL     Status: Abnormal   Collection Time    07/29/12  3:18 PM      Result Value Range   Sodium 135  135 - 145 mEq/L   Potassium 5.2 (*) 3.5 - 5.1 mEq/L   Chloride 100  96 - 112 mEq/L   CO2 27  19 - 32 mEq/L   Glucose, Bld 209 (*) 70 - 99 mg/dL   BUN 20  6 - 23 mg/dL   Creatinine, Ser 0.80  0.50 - 1.35 mg/dL   Calcium 8.9  8.4 - 10.5 mg/dL   Total Protein 6.6  6.0 - 8.3 g/dL   Albumin 3.7  3.5 - 5.2 g/dL   AST 17  0 - 37 U/L   ALT 17  0 - 53 U/L   Alkaline Phosphatase 84  39 - 117 U/L   Total Bilirubin 0.4  0.3 - 1.2 mg/dL   GFR calc non Af Amer 77 (*) >90 mL/min   GFR calc Af Amer  90 (*) >90 mL/min   Comment:            The eGFR has been calculated     using the CKD EPI equation.     This calculation has not been     validated in all clinical     situations.     eGFR's persistently     <90 mL/min signify     possible Chronic Kidney Disease.  CBC WITH DIFFERENTIAL     Status: Abnormal   Collection Time    07/29/12  3:18 PM      Result Value Range   WBC 11.9 (*) 4.0 - 10.5 K/uL   RBC 3.99 (*) 4.22 - 5.81 MIL/uL   Hemoglobin 12.4 (*) 13.0 - 17.0 g/dL   HCT 35.1 (*) 39.0 - 52.0 %   MCV 88.0  78.0 - 100.0 fL   MCH 31.1  26.0 - 34.0 pg   MCHC 35.3  30.0 - 36.0 g/dL   RDW 13.6    11.5 - 15.5 %   Platelets 163  150 - 400 K/uL   Neutrophils Relative 88 (*) 43 - 77 %   Neutro Abs 10.5 (*) 1.7 - 7.7 K/uL   Lymphocytes Relative 6 (*) 12 - 46 %   Lymphs Abs 0.8  0.7 - 4.0 K/uL   Monocytes Relative 5  3 - 12 %   Monocytes Absolute 0.6  0.1 - 1.0 K/uL   Eosinophils Relative 0  0 - 5 %   Eosinophils Absolute 0.1  0.0 - 0.7 K/uL   Basophils Relative 0  0 - 1 %   Basophils Absolute 0.0  0.0 - 0.1 K/uL  PROTIME-INR     Status: None   Collection Time    07/29/12  3:18 PM      Result Value Range   Prothrombin Time 13.2  11.6 - 15.2 seconds   INR 1.01  0.00 - 1.49  APTT     Status: None   Collection Time    07/29/12  3:18 PM      Result Value Range   aPTT 31  24 - 37 seconds  GLUCOSE, CAPILLARY     Status: Abnormal   Collection Time    07/29/12  5:16 PM      Result Value Range   Glucose-Capillary 197 (*) 70 - 99 mg/dL  CBC     Status: Abnormal   Collection Time    07/29/12  8:39 PM      Result Value Range   WBC 10.2  4.0 - 10.5 K/uL   RBC 3.76 (*) 4.22 - 5.81 MIL/uL   Hemoglobin 11.4 (*) 13.0 - 17.0 g/dL   HCT 32.6 (*) 39.0 - 52.0 %   MCV 86.7  78.0 - 100.0 fL   MCH 30.3  26.0 - 34.0 pg   MCHC 35.0  30.0 - 36.0 g/dL   RDW 13.6  11.5 - 15.5 %   Platelets 155  150 - 400 K/uL  CREATININE, SERUM     Status: Abnormal   Collection Time    07/29/12  8:39 PM       Result Value Range   Creatinine, Ser 0.69  0.50 - 1.35 mg/dL   GFR calc non Af Amer 82 (*) >90 mL/min   GFR calc Af Amer >90  >90 mL/min   Comment:            The eGFR has been calculated     using the CKD EPI equation.     This calculation has not been     validated in all clinical     situations.     eGFR's persistently     <90 mL/min signify     possible Chronic Kidney Disease.  GLUCOSE, CAPILLARY     Status: Abnormal   Collection Time    07/29/12  9:38 PM      Result Value Range   Glucose-Capillary 160 (*) 70 - 99 mg/dL    Dg Chest 1 View  07/29/2012  *RADIOLOGY REPORT*  Clinical Data: Preop left hip fracture  CHEST - 1 VIEW  Comparison: 08/01/2010  Findings: Lungs are clear. No pleural effusion or pneumothorax.  Heart is top normal in size. Postsurgical changes related to prior CABG.  IMPRESSION: No evidence of acute cardiopulmonary disease.   Original Report Authenticated By: Sriyesh Krishnan, M.D.    Dg Hip Complete Left  07/29/2012  *RADIOLOGY REPORT*  Clinical Data: Fall  LEFT HIP - COMPLETE 2+ VIEW  Comparison: None.    Findings: Intertrochanteric fracture of the left hip with angulation and mild displacement.  Mild degenerative change in the left hip joint.  IMPRESSION: Intertrochanteric fracture left femur.   Original Report Authenticated By: David Clark, M.D.     Review of Systems  Respiratory: Negative.   Cardiovascular: Negative for chest pain, palpitations and orthopnea.       Previous bypass  Gastrointestinal: Negative.   Genitourinary: Positive for frequency.  Neurological: Negative.   Endo/Heme/Allergies: Negative.   Psychiatric/Behavioral: Negative.    Blood pressure 104/40, pulse 80, temperature 98.1 F (36.7 C), temperature source Oral, resp. rate 18, SpO2 98.00%. Physical Exam  Constitutional: He is oriented to person, place, and time. He appears well-developed and well-nourished.  HENT:  Head: Normocephalic.  Eyes: Pupils are equal, round, and  reactive to light.  Neck: Normal range of motion.  Cardiovascular: Normal rate.   Respiratory: Effort normal.  GI: Soft.  Musculoskeletal:  Left LE short and ER, pulses and sciatic sensation intact  Neurological: He is alert and oriented to person, place, and time.  Skin: Skin is warm and dry.  Psychiatric: He has a normal mood and affect. His behavior is normal. Judgment and thought content normal.    Assessment/Plan: Left IT Hip Fracture.    Lives with his wife, he does the cooking. Daughter also in town.   Plan surgery Tuesday 7PM    Discussed risks , options, he understands and agrees to proceed.   Wolfgang Finigan C 07/29/2012, 10:49 PM      

## 2012-07-30 NOTE — Brief Op Note (Signed)
07/29/2012 - 07/30/2012  8:58 PM  PATIENT:  Curtis White  79 y.o. male  PRE-OPERATIVE DIAGNOSIS:  Left Intertrochanteric Fracture  POST-OPERATIVE DIAGNOSIS:  Left Intertrochanteric Fracture  PROCEDURE:  Procedure(s): Left Trochanteric Compression Screw (Long) (Left)  Biomet  SURGEON:  Surgeon(s) and Role:    * [name], MD - Primary  PHYSICIAN ASSISTANT:   ASSISTANTS: none   ANESTHESIA:   general  EBL:  Total I/O In: 1050 [I.V.:1050] Out: 325 [Urine:125; Blood:200]  BLOOD ADMINISTERED:none  DRAINS: none   LOCAL MEDICATIONS USED:  NONE  SPECIMEN:  No Specimen  DISPOSITION OF SPECIMEN:  N/A  COUNTS:  YES  TOURNIQUET:  * No tourniquets in log *  DICTATION: .Other Dictation: Dictation Number [id number]  PLAN OF CARE: still inpatient  PATIENT DISPOSITION:  PACU - hemodynamically stable.   Delay start of Pharmacological VTE agent (>24hrs) due to surgical blood loss or risk of bleeding: yes

## 2012-07-30 NOTE — Progress Notes (Signed)
Subjective:  The patient is feeling well.  Denies any chest pain or shortness of breath.  Rhythm is stable on bedside exam.  Awaiting surgery scheduled for later this afternoon.  Objective:  Vital Signs in the last 24 hours: Temp:  [97.4 F (36.3 C)-98.4 F (36.9 C)] 98.4 F (36.9 C) (03/18 0611) Pulse Rate:  [68-94] 79 (03/18 0611) Resp:  [17-20] 18 (03/18 0611) BP: (104-168)/(39-99) 163/99 mmHg (03/18 0611) SpO2:  [96 %-100 %] 97 % (03/18 0611) Weight:  [180 lb (81.647 kg)] 180 lb (81.647 kg) (03/18 0255)  Intake/Output from previous day: 03/17 0701 - 03/18 0700 In: -  Out: 600 [Urine:600] Intake/Output from this shift:    . alfuzosin  10 mg Oral Daily  . aspirin EC  325 mg Oral Daily  . benazepril  10 mg Oral Daily  .  ceFAZolin (ANCEF) IV  2 g Intravenous Once  . enoxaparin (LOVENOX) injection  40 mg Subcutaneous Q24H  . ezetimibe  10 mg Oral QHS   And  . simvastatin  10 mg Oral QHS  . finasteride  5 mg Oral Daily  . insulin aspart  0-9 Units Subcutaneous TID WC  . insulin aspart  3 Units Subcutaneous TID WC  . metoprolol succinate  50 mg Oral Daily  . sodium chloride  3 mL Intravenous Q12H  . [START ON 08/13/2012] Vitamin D (Ergocalciferol)  50,000 Units Oral Q14 Days      Physical Exam: The patient appears to be in no distress.  Head and neck exam reveals that the pupils are equal and reactive.  The extraocular movements are full.  There is no scleral icterus.  Mouth and pharynx are benign.  No lymphadenopathy.  No carotid bruits.  The jugular venous pressure is normal.  Thyroid is not enlarged or tender.  Chest is clear to percussion and auscultation.  No rales or rhonchi.  Expansion of the chest is symmetrical.  Heart reveals no abnormal lift or heave.  First and second heart sounds are normal.  There is no murmur gallop rub or click.  The abdomen is soft and nontender.  Bowel sounds are normoactive.  There is no hepatosplenomegaly or mass.  There are no  abdominal bruits.  Extremities reveal no phlebitis or edema.  Pedal pulses present.  Neurologic exam is normal strength and no lateralizing weakness.  No sensory deficits.  Integument reveals no rash  Lab Results:  Recent Labs  07/29/12 2039 07/30/12 0510  WBC 10.2 7.9  HGB 11.4* 11.1*  PLT 155 155    Recent Labs  07/29/12 1518 07/29/12 2039 07/30/12 0510  NA 135  --  138  K 5.2*  --  4.7  CL 100  --  103  CO2 27  --  26  GLUCOSE 209*  --  183*  BUN 20  --  17  CREATININE 0.80 0.69 0.69   No results found for this basename: TROPONINI, CK, MB,  in the last 72 hours Hepatic Function Panel  Recent Labs  07/29/12 1518  PROT 6.6  ALBUMIN 3.7  AST 17  ALT 17  ALKPHOS 84  BILITOT 0.4   No results found for this basename: CHOL,  in the last 72 hours No results found for this basename: PROTIME,  in the last 72 hours  Imaging: Imaging results have been reviewed  Cardiac Studies:  Assessment/Plan:  1. Mechanical fall on ice with resultant L hip fracture  2. CAD s/p CABG with redo 1997  3. Diabetes  mellitus  4. HTN  5. Mild hyperkalemia, resolved  Plan: Okay for planned orthopedic surgery later today.  Continue current cardiac medications.   LOS: 1 day    Cassell Clement 07/30/2012, 11:02 AM

## 2012-07-30 NOTE — Progress Notes (Signed)
Left hip site is very oozy (dsg was just changed prior to leaving OR). Dr [name] notified and aware. Dsg reinforced again and ice bag against site.

## 2012-07-30 NOTE — Anesthesia Procedure Notes (Signed)
Spinal  Patient location during procedure: OR Start time: 07/30/2012 7:30 AM End time: 07/30/2012 7:35 AM Staffing Performed by: anesthesiologist  Preanesthetic Checklist Completed: patient identified, site marked, surgical consent, pre-op evaluation, timeout performed, IV checked, risks and benefits discussed and monitors and equipment checked Spinal Block Patient position: left lateral decubitus Prep: Betadine Patient monitoring: heart rate, cardiac monitor, continuous pulse ox and blood pressure Approach: left paramedian Location: L3-4 Injection technique: single-shot Needle Needle type: Quincke  Needle gauge: 22 G Needle length: 9 cm Needle insertion depth: 5 cm Assessment Sensory level: T6 Additional Notes Uneventful, one dural puncture, one attempt, clear csf, no paresthesia or heme.

## 2012-07-30 NOTE — Anesthesia Postprocedure Evaluation (Signed)
Anesthesia Post Note  Patient: Curtis White  Procedure(s) Performed: Procedure(s) (LRB): Left Trochanteric Compression Screw (Long) (Left)  Anesthesia type: Spinal  Patient location: PACU  Post pain: Pain level controlled  Post assessment: Patient's Cardiovascular Status Stable  Last Vitals:  Filed Vitals:   07/30/12 2145  BP: 96/37  Pulse: 70  Temp:   Resp: 15    Post vital signs: Reviewed and stable  Level of consciousness: alert  Complications: No apparent anesthesia complications    Motor function returned below T-12

## 2012-07-30 NOTE — Anesthesia Preprocedure Evaluation (Addendum)
Anesthesia Evaluation  Patient identified by MRN, date of birth, ID band Patient awake    Reviewed: Allergy & Precautions, H&P , NPO status , Patient's Chart, lab work & pertinent test results, reviewed documented beta blocker date and time   Airway Mallampati: II TM Distance: >3 FB Neck ROM: full    Dental   Pulmonary neg pulmonary ROS,  breath sounds clear to auscultation        Cardiovascular hypertension, On Medications and On Home Beta Blockers + CAD, + CABG and +CHF Rhythm:regular Rate:Normal     Neuro/Psych negative neurological ROS  negative psych ROS   GI/Hepatic negative GI ROS, Neg liver ROS,   Endo/Other  diabetes, Oral Hypoglycemic Agents  Renal/GU negative Renal ROS  negative genitourinary   Musculoskeletal   Abdominal   Peds  Hematology negative hematology ROS (+)   Anesthesia Other Findings See surgeon's H&P   Reproductive/Obstetrics negative OB ROS                           Anesthesia Physical Anesthesia Plan  ASA: III  Anesthesia Plan: Spinal   Post-op Pain Management:    Induction:   Airway Management Planned: Simple Face Mask  Additional Equipment:   Intra-op Plan:   Post-operative Plan:   Informed Consent: I have reviewed the patients History and Physical, chart, labs and discussed the procedure including the risks, benefits and alternatives for the proposed anesthesia with the patient or authorized representative who has indicated his/her understanding and acceptance.   Dental Advisory Given  Plan Discussed with: CRNA and Surgeon  Anesthesia Plan Comments:         Anesthesia Quick Evaluation

## 2012-07-30 NOTE — Interval H&P Note (Signed)
History and Physical Interval Note:  07/30/2012 7:21 PM  Curtis White  has presented today for surgery, with the diagnosis of Left Intertrochanteric Fracture  The various methods of treatment have been discussed with the patient and family. After consideration of risks, benefits and other options for treatment, the patient has consented to  Procedure(s): Left Trochanteric Compression Screw (Long) (Left) as a surgical intervention .  The patient's history has been reviewed, patient examined, no change in status, stable for surgery.  I have reviewed the patient's chart and labs.  Questions were answered to the patient's satisfaction.     Curtis White C

## 2012-07-30 NOTE — Transfer of Care (Signed)
Immediate Anesthesia Transfer of Care Note  Patient: Curtis White  Procedure(s) Performed: Procedure(s): Left Trochanteric Compression Screw (Long) (Left)  Patient Location: PACU  Anesthesia Type:Regional  Level of Consciousness: awake, alert  and oriented  Airway & Oxygen Therapy: Patient Spontanous Breathing and Patient connected to nasal cannula oxygen  Post-op Assessment: Report given to PACU RN, Post -op Vital signs reviewed and stable and *remains on Neosynephrine drip**  Post vital signs: Reviewed and stable  Complications: No apparent anesthesia complications

## 2012-07-31 LAB — CBC
MCV: 85.3 fL (ref 78.0–100.0)
Platelets: 126 10*3/uL — ABNORMAL LOW (ref 150–400)
RDW: 13.7 % (ref 11.5–15.5)
WBC: 7.8 10*3/uL (ref 4.0–10.5)

## 2012-07-31 LAB — GLUCOSE, CAPILLARY: Glucose-Capillary: 203 mg/dL — ABNORMAL HIGH (ref 70–99)

## 2012-07-31 LAB — BASIC METABOLIC PANEL
Chloride: 103 mEq/L (ref 96–112)
Creatinine, Ser: 0.63 mg/dL (ref 0.50–1.35)
GFR calc Af Amer: >90 mL/min (ref >90)

## 2012-07-31 MED ORDER — ASPIRIN 325 MG PO TBEC: 325.0000 mg | DELAYED_RELEASE_TABLET | Freq: Every day | ORAL | Status: DC

## 2012-07-31 MED ORDER — INSULIN ASPART 100 UNIT/ML ~~LOC~~ SOLN
0.0000 [IU] | Freq: Three times a day (TID) | SUBCUTANEOUS | Status: DC
  Administered 2012-07-31: 2 [IU] via SUBCUTANEOUS
  Administered 2012-07-31: 3 [IU] via SUBCUTANEOUS
  Administered 2012-07-31: 1 [IU] via SUBCUTANEOUS
  Administered 2012-08-01: 3 [IU] via SUBCUTANEOUS
  Administered 2012-08-01: 2 [IU] via SUBCUTANEOUS

## 2012-07-31 MED ORDER — ENOXAPARIN SODIUM 40 MG/0.4ML ~~LOC~~ SOLN
40.0000 mg | SUBCUTANEOUS | Status: DC
Start: 2012-07-31 — End: 2012-08-01
  Administered 2012-07-31 – 2012-08-01 (×2): 40 mg via SUBCUTANEOUS
  Filled 2012-07-31 (×2): qty 0.4

## 2012-07-31 MED ORDER — HYDROCODONE-ACETAMINOPHEN 5-325 MG PO TABS: 1.0000 | ORAL_TABLET | Freq: Four times a day (QID) | ORAL | Status: DC | PRN

## 2012-07-31 MED FILL — Acetaminophen IV Soln 10 MG/ML: INTRAVENOUS | Qty: 100 | Status: AC

## 2012-07-31 NOTE — Progress Notes (Signed)
Patient Name: Curtis White Date of Encounter: 07/31/2012     Principal Problem:   Hip fracture, left Active Problems:   DIABETES MELLITUS, TYPE II, CONTROLLED   HYPERLIPIDEMIA-MIXED   HYPERTENSION, BENIGN   CAD, AUTOLOGOUS BYPASS GRAFT   Chronic systolic CHF (congestive heart failure)   Preop cardiovascular exam    SUBJECTIVE  Seen earlier today and seems to be doing well.  No major complaints.  Feels good.    CURRENT MEDS . aspirin EC  325 mg Oral Q breakfast  . docusate sodium  100 mg Oral BID  . enoxaparin (LOVENOX) injection  40 mg Subcutaneous Q24H  . insulin aspart  0-9 Units Subcutaneous TID WC    OBJECTIVE  Filed Vitals:   07/31/12 0800 07/31/12 1120 07/31/12 1300 07/31/12 1526  BP:   114/39   Pulse:   70   Temp:   98 F (36.7 C)   TempSrc:      Resp: 16 15 16 14   Height:      Weight:      SpO2: 96% 96% 98% 97%    Intake/Output Summary (Last 24 hours) at 07/31/12 1909 Last data filed at 07/31/12 1330  Gross per 24 hour  Intake 2497.5 ml  Output    895 ml  Net 1602.5 ml   Filed Weights   07/30/12 0255  Weight: 180 lb (81.647 kg)    PHYSICAL EXAM  General: Pleasant, NAD. Neuro: Alert and oriented X 3. Moves all extremities spontaneously. Psych: Normal affect. HEENT:  Normal  Neck: Supple without bruits or JVD. Lungs:  Resp regular and unlabored, CTA. Heart: RRR no s3, s4, or murmurs. Extremities: SP hip  Accessory Clinical Findings  CBC  Recent Labs  07/29/12 1518  07/30/12 0510 07/31/12 0505  WBC 11.9*  < > 7.9 7.8  NEUTROABS 10.5*  --   --   --   HGB 12.4*  < > 11.1* 9.0*  HCT 35.1*  < > 31.8* 25.7*  MCV 88.0  < > 87.4 85.3  PLT 163  < > 155 126*  < > = values in this interval not displayed. Basic Metabolic Panel  Recent Labs  07/30/12 0510 07/31/12 0505  NA 138 135  K 4.7 4.1  CL 103 103  CO2 26 25  GLUCOSE 183* 216*  BUN 17 16  CREATININE 0.69 0.63  CALCIUM 8.7 8.3*   Liver Function Tests  Recent Labs  07/29/12 1518  AST 17  ALT 17  ALKPHOS 84  BILITOT 0.4  PROT 6.6  ALBUMIN 3.7   No results found for this basename: LIPASE, AMYLASE,  in the last 72 hours Cardiac Enzymes No results found for this basename: CKTOTAL, CKMB, CKMBINDEX, TROPONINI,  in the last 72 hours BNP No components found with this basename: POCBNP,  D-Dimer No results found for this basename: DDIMER,  in the last 72 hours Hemoglobin A1C  Recent Labs  07/29/12 2039  HGBA1C 6.0*   Fasting Lipid Panel No results found for this basename: CHOL, HDL, LDLCALC, TRIG, CHOLHDL, LDLDIRECT,  in the last 72 hours Thyroid Function Tests No results found for this basename: TSH, T4TOTAL, FREET3, T3FREE, THYROIDAB,  in the last 72 hours  Radiology/Studies  Dg Chest 1 View  07/29/2012  *RADIOLOGY REPORT*  Clinical Data: Preop left hip fracture  CHEST - 1 VIEW  Comparison: 08/01/2010  Findings: Lungs are clear. No pleural effusion or pneumothorax.  Heart is top normal in size. Postsurgical changes related to prior CABG.  IMPRESSION: No evidence of acute cardiopulmonary disease.   Original Report Authenticated By: Charline Bills, M.D.    Dg Hip Complete Left  07/29/2012  *RADIOLOGY REPORT*  Clinical Data: Fall  LEFT HIP - COMPLETE 2+ VIEW  Comparison: None.  Findings: Intertrochanteric fracture of the left hip with angulation and mild displacement.  Mild degenerative change in the left hip joint.  IMPRESSION: Intertrochanteric fracture left femur.   Original Report Authenticated By: Janeece Riggers, M.D.    Dg Hip Operative Left  07/30/2012  *RADIOLOGY REPORT*  Clinical Data: ORIF left hip.  Fluoroscopy time recorded as of 1 minute 12 seconds.  OPERATIVE LEFT HIP,PORTABLE PELVIS  Comparison: 07/29/2012  Findings: Intraoperative spot fluoroscopic images of the left hip are obtained for surgical control purposes demonstrating interval placement of N H measure Larry rod in the femur with distal locking screw and proximal compression  bolt in the left hip transfixing the previous intertrochanteric fractures.  Near anatomic alignment and position of the fracture fragments with mildly displaced lesser trochanteric fragment.  Subsequent portable views of the left hip demonstrate placement of the internal fixation hardware as described.  Visualized hardware components appear well seated.  Skin clips and subcutaneous emphysema consistent with recent surgery.  Degenerative changes noted in the right hip.  IMPRESSION: Internal fixation of intertrochanteric fractures of the left hip using intramedullary rod with distal locking screw and proximal compression vault.  Improved alignment and position of fracture fragments since prior preoperative study.   Original Report Authenticated By: Burman Nieves, M.D.    Dg Pelvis Portable  07/30/2012  *RADIOLOGY REPORT*  Clinical Data: ORIF left hip.  Fluoroscopy time recorded as of 1 minute 12 seconds.  OPERATIVE LEFT HIP,PORTABLE PELVIS  Comparison: 07/29/2012  Findings: Intraoperative spot fluoroscopic images of the left hip are obtained for surgical control purposes demonstrating interval placement of N H measure Larry rod in the femur with distal locking screw and proximal compression bolt in the left hip transfixing the previous intertrochanteric fractures.  Near anatomic alignment and position of the fracture fragments with mildly displaced lesser trochanteric fragment.  Subsequent portable views of the left hip demonstrate placement of the internal fixation hardware as described.  Visualized hardware components appear well seated.  Skin clips and subcutaneous emphysema consistent with recent surgery.  Degenerative changes noted in the right hip.  IMPRESSION: Internal fixation of intertrochanteric fractures of the left hip using intramedullary rod with distal locking screw and proximal compression vault.  Improved alignment and position of fracture fragments since prior preoperative study.   Original  Report Authenticated By: Burman Nieves, M.D.     ASSESSMENT AND PLAN  1.  Stable elderly gentleman sp redo CABG 1997 with recath in 2003 (Dr. Juanda Chance) with findings as noted previously.  No current symptoms post op.  Is scheduled for [facility] nursing.    Will recheck 12 lead tomorrow am.  Otherwise stable at presen time.    Signed, Shawnie Pons MD, [facility], FSC[facility]

## 2012-07-31 NOTE — Progress Notes (Signed)
Patient ID: Curtis White  male  XBJ:[id number]    DOB: [birth year]    DOA: 07/29/2012  PCP: [name], MD  Assessment/Plan: Principal Problem:   Hip fracture, left: POST OP DAY1 - Pain controlled, tolerating diet - on lovenox, cont ASA at DC    Active Problems:   DIABETES MELLITUS, TYPE II, CONTROLLED - Will keep sliding scale insulin while inpatient    HYPERLIPIDEMIA-MIXED - Continue Vytorin    HYPERTENSION, BENIGN: Stable    CAD, CABG,  Chronic systolic CHF (congestive heart failure)- compensated - Appreciate cardiology for preop clearance, currently stable   DVT Prophylaxis:lovenox  Code Status: FC  Disposition: wishes to go to [facility], will update SW     Subjective: Stable, no specific complaints, feels better today   Objective: Weight change:   Intake/Output Summary (Last 24 hours) at 07/31/12 1238 Last data filed at 07/31/12 0500  Gross per 24 hour  Intake 2135.33 ml  Output   1095 ml  Net 1040.33 ml   Blood pressure 103/44, pulse 68, temperature 97.9 F (36.6 C), temperature source Oral, resp. rate 15, height 5\' 10"  (1.778 m), weight 81.647 kg (180 lb), SpO2 96.00%.  Physical Exam: General: AXOX3, oriented x3, NAD. CVS: S1-S2 clear, no mrg Chest: CTA B, no wheezing, rales or rhonchi Abdomen: soft NT, ND, NBS Extremities: no cyanosis, clubbing or edema noted bilaterally, b/l SCD's   Lab Results: Basic Metabolic Panel:  Recent Labs Lab 07/30/12 0510 07/31/12 0505  NA 138 135  K 4.7 4.1  CL 103 103  CO2 26 25  GLUCOSE 183* 216*  BUN 17 16  CREATININE 0.69 0.63  CALCIUM 8.7 8.3*   Liver Function Tests:  Recent Labs Lab 07/29/12 1518  AST 17  ALT 17  ALKPHOS 84  BILITOT 0.4  PROT 6.6  ALBUMIN 3.7     Recent Labs Lab 07/29/12 1518  07/30/12 0510 07/31/12 0505  WBC 11.9*  < > 7.9 7.8  NEUTROABS 10.5*  --   --   --   HGB 12.4*  < > 11.1* 9.0*  HCT 35.1*  < > 31.8* 25.7*  MCV 88.0  < > 87.4 85.3  PLT 163  < > 155  126*  < > = values in this interval not displayed. CBG:  Recent Labs Lab 07/30/12 1705 07/30/12 1844 07/30/12 2123 07/31/12 0642 07/31/12 1143  GLUCAP 148* 141* 146* 203* 190*     Micro Results: Recent Results (from the past 240 hour(s))  MRSA PCR SCREENING     Status: None   Collection Time    07/30/12  6:42 AM      Result Value Range Status   MRSA by PCR NEGATIVE  NEGATIVE Final   Comment:            The GeneXpert MRSA Assay (FDA     approved for NASAL specimens     only), is one component of a     comprehensive MRSA colonization     surveillance program. It is not     intended to diagnose MRSA     infection nor to guide or     monitor treatment for     MRSA infections.    Studies/Results: Dg Chest 1 View  07/29/2012  *RADIOLOGY REPORT*  Clinical Data: Preop left hip fracture  CHEST - 1 VIEW  Comparison: 08/01/2010  Findings: Lungs are clear. No pleural effusion or pneumothorax.  Heart is top normal in size. Postsurgical changes related to prior CABG.  IMPRESSION: No evidence of acute cardiopulmonary disease.   Original Report Authenticated By: Charline Bills, M.D.    Dg Hip Complete Left  07/29/2012  *RADIOLOGY REPORT*  Clinical Data: Fall  LEFT HIP - COMPLETE 2+ VIEW  Comparison: None.  Findings: Intertrochanteric fracture of the left hip with angulation and mild displacement.  Mild degenerative change in the left hip joint.  IMPRESSION: Intertrochanteric fracture left femur.   Original Report Authenticated By: Janeece Riggers, M.D.     Medications: Scheduled Meds: . aspirin EC  325 mg Oral Q breakfast  . docusate sodium  100 mg Oral BID  . insulin aspart  0-9 Units Subcutaneous TID WC      LOS: 2 days   RAI,RIPUDEEP M.D. [facility] 07/31/2012, 12:38 PM Pager: [phone]  If 7PM-7AM, please contact night-coverage [email or web address] Password [email or web address]

## 2012-07-31 NOTE — Op Note (Signed)
NAMEWENDLE, Curtis White NO.:  [id number]  MEDICAL RECORD NO.:  [id number]  LOCATION:  [id number]                        FACILITY:  [facility]  PHYSICIAN:  Dayle Sherpa C. [name], M.D.    DATE OF BIRTH:  11-03-1921  DATE OF PROCEDURE:  07/30/2012 DATE OF DISCHARGE:                              OPERATIVE REPORT   PREOPERATIVE DIAGNOSIS:  Comminuted left intertrochanteric fracture.  POSTOPERATIVE DIAGNOSIS:  Comminuted left intertrochanteric fracture.  PROCEDURE:  Left trochanteric compression screw (long).  Biomet 360 x 11 mm with 110 mm lag screw, and 42 mm distal interlock bicortical screw.  SURGEON:  Enaya Howze C. [name], M.D.  ANESTHESIA:  General.  EBL:  200 mL.  DRAINS:  None.  PROCEDURE:  After induction of general anesthesia, the patient was placed on the fracture table.  Preoperative Ancef was given.  Time-out procedure was completed.  Hip was distracted, internally rotated, checked under fluoroscopy.  Opposite well leg holder was used and carefully padded over the peroneal nerve and ankle.  Distraction and internal rotation was performed, reduction was performed and alignment looked good AP and lateral.  DuraPrep was used for prepping from the iliac crest and just past the knee.  Area was squared with towels, Betadine, large shower curtain Steri-Drape was applied.  Incision was made proximal to the trochanter, in line with the trochanter, gluteus medius fascia was split.  Pin was placed under fluoroscopy.  The tip of the trochanter reamed and over-reamed and rod was placed.  It was progressed down to the top of the pole of the patella, checked under AP and lateral.  Initially, the nail 360 based on measurements x11 was attempted to be inserted, however, caught up in relatively narrow intramedullary canal, was reamed first 11 and 12 mm and then the 11 mm nail was inserted with very tight fit through the isthmus down to the superior pole of the patella, checked at the hip  until it was parallel and lined up, and final pin was drilled up using the lateral guide through the lateral cortex across the nail and into the had close to center-center.  In order to get in that position, the nail came a little bit anterior in the neck, but ran up the canal.  Reaming and then insertion of the screw locked down tight back up quarter turn, then removal of the lateral proximal guide.  Distal interlock was done in freehand technique.  42 mm was based on depth gauge measurements, checked AP and lateral with good length, good position, good stability. All wounds were irrigated.  Deep fascia was closed with #1 Vicryl, 2-0 Vicryl in the subcutaneous tissue, skin staple closure, postop dressing for all 3 incisions and transferred to the recovery room in stable condition.     Dravon Nott C. [name], M.D.     MCY/MEDQ  D:  07/30/2012  T:  07/31/2012  Job:  [id number]

## 2012-07-31 NOTE — Clinical Social Work Psychosocial (Signed)
Clinical Social Work Department  BRIEF PSYCHOSOCIAL ASSESSMENT  Patient: Curtis White  Account Number: [id number]  Admit date: 07/29/12 Clinical Social Worker Rydge Texidor [name], MSW Date/Time:  Referred by: Physician Date Referred: 07/31/2012 Referred for   SNF Placement   Other Referral:  Interview type: Patient  Other interview type: PSYCHOSOCIAL DATA  Living Status: Alone Admitted from facility:  Level of care:  Primary support name: Cheatum,Renee Primary support relationship to patient: daughter Degree of support available:  Strong and vested  CURRENT CONCERNS  Current Concerns   Post-Acute Placement   Other Concerns:  SOCIAL WORK ASSESSMENT / PLAN  CSW met with pt re: PT recommendation for SNF.   Pt lives alone  CSW explained placement process and answered questions.   Pt reports [facility] as his preference    CSW completed FL2 and initiated SNF search.     Assessment/plan status: Information/Referral to [facility]  Other assessment/ plan:  Information/referral to community resources:  SNF   PTAR   PATIENT'S/FAMILY'S RESPONSE TO PLAN OF CARE:  Pt  reports he is agreeable to [facility] in order to increase strength and independence with mobility prior to return home  Pt verbalized understanding of placement process and appreciation for CSW assist.   Sabino Niemann, MSW [phone]

## 2012-07-31 NOTE — Progress Notes (Signed)
Subjective: 1 Day Post-Op Procedure(s) (LRB): Left Trochanteric Compression Screw (Long) (Left) Patient reports pain as mild.  Much better today. Wishes to go to [facility] for rehab.    Objective: Vital signs in last 24 hours: Temp:  [97.3 F (36.3 C)-98.4 F (36.9 C)] 97.9 F (36.6 C) (03/19 0550) Pulse Rate:  [68-77] 68 (03/19 0550) Resp:  [13-20] 18 (03/19 0550) BP: (95-116)/(30-63) 103/44 mmHg (03/19 0550) SpO2:  [96 %-100 %] 100 % (03/19 0550)  Intake/Output from previous day: 03/18 0701 - 03/19 0700 In: 2735.3 [P.O.:600; I.V.:2135.3] Out: 1245 [Urine:995; Blood:250] Intake/Output this shift:     Recent Labs  07/29/12 1518 07/29/12 2039 07/30/12 0510 07/31/12 0505  HGB 12.4* 11.4* 11.1* 9.0*    Recent Labs  07/30/12 0510 07/31/12 0505  WBC 7.9 7.8  RBC 3.64* 2.99*  HCT 31.8* 25.7*  PLT 155 126*    Recent Labs  07/30/12 0510 07/31/12 0505  NA 138 135  K 4.7 4.1  CL 103 103  CO2 26 25  BUN 17 16  CREATININE 0.69 0.63  GLUCOSE 183* 216*  CALCIUM 8.7 8.3*    Recent Labs  07/29/12 1518  INR 1.01    Neurovascular intact Sensation intact distally Dorsiflexion/Plantar flexion intact Incision: bloody drainage through surgical dressing and reinforcement applied which is dry.  no active drainage currently.  thigh soft.  Assessment/Plan: 1 Day Post-Op Procedure(s) (LRB): Left Trochanteric Compression Screw (Long) (Left) Advance diet Up with therapy Discharge to SNF when stable.  Pt lives close to [facility] and wishes to go there for rehab. WBAT to left LE.   Aspirin 325mg  daily for VTE prophylaxis  Curtis White M 07/31/2012, 8:52 AM

## 2012-07-31 NOTE — Care Management Note (Signed)
CARE MANAGEMENT NOTE 07/31/2012  Patient:  Curtis White, Curtis White   Account Number:  [id number]  Date Initiated:  07/30/2012  Documentation initiated by:  [facility]  Subjective/Objective Assessment:   admitted with left hip fracture     Action/Plan:   PT/OT evals after surgery    3/19- patient is for shortterm SNf, social worker is aware.   Anticipated DC Date:  08/02/2012   Anticipated DC Plan:  SKILLED NURSING FACILITY  In-house referral  Clinical Social Worker      DC Planning Services  CM consult      Choice offered to / List presented to:             Status of service:  Completed, signed off Medicare Important Message given?   (If response is "NO", the following Medicare IM given date fields will be blank) Date Medicare IM given:   Date Additional Medicare IM given:    Discharge Disposition:  SKILLED NURSING FACILITY  Per UR Regulation:  Reviewed for med. necessity/level of care/duration of stay  If discussed at Long Length of Stay Meetings, dates discussed:    Comments:

## 2012-07-31 NOTE — Progress Notes (Signed)
Physical Therapy Evaluation Patient Details Name: Curtis White MRN: [id number] DOB: [birth year] Today's Date: 07/31/2012 Time: 4098-1191 PT Time Calculation (min): 16 min  PT Assessment / Plan / Recommendation Clinical Impression  Pt is an 79 y/o male s/p L hip fracture. Pt will be followed by acute PT to improve mobility for d/c to short term SNF and eventual return to home.     PT Assessment  Patient needs continued PT services    Follow Up Recommendations  SNF    Does the patient have the potential to tolerate intense rehabilitation      Barriers to Discharge Decreased caregiver support Wife unable to assist pt.     Equipment Recommendations  None recommended by PT    Recommendations for Other Services     Frequency Min 3X/week    Precautions / Restrictions Restrictions Weight Bearing Restrictions: Yes LLE Weight Bearing: Weight bearing as tolerated   Pertinent Vitals/Pain 6/10 pain in L Hip      Mobility  Bed Mobility Bed Mobility: Supine to Sit;Sitting - Scoot to Edge of Bed Supine to Sit: 4: Min assist;HOB elevated Sitting - Scoot to [place] of Bed: 4: Min assist Transfers Transfers: Sit to Stand;Stand to Sit Sit to Stand: 3: Mod assist;From bed;With upper extremity assist Stand to Sit: 3: Mod assist;To chair/3-in-1;With upper extremity assist Ambulation/Gait Ambulation/Gait Assistance: 2: Max assist Ambulation Distance (Feet): 10 Feet Assistive device: Rolling walker Ambulation/Gait Assistance Details: assist to support pt's body weight for pt to advance RLE secondary to pain on LLE and UE weakness Gait Pattern: Step-to pattern;Decreased weight shift to left;Decreased stance time - left Stairs: No    Exercises     PT Diagnosis: Difficulty walking;Generalized weakness;Acute pain  PT Problem List: Decreased strength;Decreased activity tolerance;Decreased mobility;Decreased knowledge of use of DME;Pain;Decreased knowledge of precautions;Decreased range of  motion PT Treatment Interventions: Gait training;Stair training;DME instruction;Functional mobility training;Therapeutic activities;Patient/family education   PT Goals Acute Rehab PT Goals PT Goal Formulation: With patient Time For Goal Achievement: 08/14/12 Potential to Achieve Goals: Good Pt will go Supine/Side to Sit: with supervision PT Goal: Supine/Side to Sit - Progress: Goal set today Pt will go Sit to Supine/Side: with supervision PT Goal: Sit to Supine/Side - Progress: Goal set today Pt will Transfer Bed to Chair/Chair to Bed: with supervision PT Transfer Goal: Bed to Chair/Chair to Bed - Progress: Goal set today Pt will Ambulate: 51 - 150 feet;with supervision;with rolling walker PT Goal: Ambulate - Progress: Goal set today  Visit Information  Last PT Received On: 07/31/12 Assistance Needed: +1    Subjective Data  Subjective: Agree to PT eval     Prior Functioning  Home Living Lives With: Spouse;Daughter Available Help at Discharge: Skilled Nursing Facility Type of Home: House Home Access: Ramped entrance Home Layout: One level Prior Function Level of Independence: Independent with assistive device(s);Independent Able to Take Stairs?: No Driving: Yes Vocation: Retired Musician: [employer] Dominant Hand: Right    Cognition  Cognition Overall Cognitive Status: Appears within functional limits for tasks assessed/performed    Extremity/Trunk Assessment Right Lower Extremity Assessment RLE ROM/Strength/Tone: [facility] for tasks assessed Left Lower Extremity Assessment LLE ROM/Strength/Tone: Unable to fully assess   Balance    End of Session PT - End of Session Equipment Utilized During Treatment: Gait belt Patient left: in chair;with call bell/phone within reach Nurse Communication: Mobility status  GP     Yamilet Mcfayden 07/31/2012, 2:27 PM Miria Cappelli L. Prem Coykendall DPT [phone]

## 2012-07-31 NOTE — Clinical Social Work Placement (Addendum)
Clinical Social Work Department  CLINICAL SOCIAL WORK PLACEMENT NOTE  07/31/2012  Patient: Curtis White  Account Number: [id number] Admit date: 07/29/12 Clinical Social Worker: Sabino Niemann MSW  Date/time: 07/31/2012 11:30 AM  Clinical Social Work is seeking post-discharge placement for this patient at the following level of care: SKILLED NURSING (*CSW will update this form in Epic as items are completed)  07/31/2012 Patient/family provided with [facility] Department of Clinical Social Work's list of facilities offering this level of care within the geographic area requested by the patient (or if unable, by the patient's family).  07/31/2012 Patient/family informed of their freedom to choose among providers that offer the needed level of care, that participate in Medicare, Medicaid or managed care program needed by the patient, have an available bed and are willing to accept the patient.  07/31/2012 Patient/family informed of [facility]' ownership interest in [facility], as well as of the fact that they are under no obligation to receive care at this facility.  PASARR submitted to EDS on 07/31/2012  PASARR number received from EDS on 07/31/2012 FL2 transmitted to all facilities in geographic area requested by pt/family on 07/31/2012  FL2 transmitted to all facilities within larger geographic area on  Patient informed that his/her managed care company has contracts with or will negotiate with certain facilities, including the following:  Patient/family informed of bed offers received:07/31/2012  Patient chooses bed at [facility] Physician recommends and patient chooses bed at  Patient to be transferred to on 08/01/2012 Patient to be transferred to facility by PTAR The following physician request were entered in Epic:  Additional Comments:  Sabino Niemann, MSW, [phone] Signing off

## 2012-08-01 DIAGNOSIS — E119 Type 2 diabetes mellitus without complications: Secondary | ICD-10-CM

## 2012-08-01 DIAGNOSIS — E785 Hyperlipidemia, unspecified: Secondary | ICD-10-CM

## 2012-08-01 LAB — BASIC METABOLIC PANEL
BUN: 14 mg/dL (ref 6–23)
CO2: 27 mEq/L (ref 19–32)
Calcium: 8.6 mg/dL (ref 8.4–10.5)
Creatinine, Ser: 0.66 mg/dL (ref 0.50–1.35)
Glucose, Bld: 175 mg/dL — ABNORMAL HIGH (ref 70–99)

## 2012-08-01 LAB — CBC
Hemoglobin: 8.4 g/dL — ABNORMAL LOW (ref 13.0–17.0)
MCH: 30.9 pg (ref 26.0–34.0)
MCV: 87.1 fL (ref 78.0–100.0)
Platelets: 124 10*3/uL — ABNORMAL LOW (ref 150–400)
RBC: 2.72 MIL/uL — ABNORMAL LOW (ref 4.22–5.81)

## 2012-08-01 LAB — GLUCOSE, CAPILLARY
Glucose-Capillary: 194 mg/dL — ABNORMAL HIGH (ref 70–99)
Glucose-Capillary: 216 mg/dL — ABNORMAL HIGH (ref 70–99)

## 2012-08-01 MED ORDER — HYDROCODONE-ACETAMINOPHEN 5-325 MG PO TABS: 1.0000 | ORAL_TABLET | Freq: Four times a day (QID) | ORAL | Status: DC | PRN

## 2012-08-01 MED ORDER — ACETAMINOPHEN 325 MG PO TABS: 650.0000 mg | ORAL_TABLET | Freq: Four times a day (QID) | ORAL | Status: DC | PRN

## 2012-08-01 MED ORDER — DSS 100 MG PO CAPS: 100.0000 mg | ORAL_CAPSULE | Freq: Two times a day (BID) | ORAL | Status: DC

## 2012-08-01 MED ORDER — METHOCARBAMOL 500 MG PO TABS: 500.0000 mg | ORAL_TABLET | Freq: Four times a day (QID) | ORAL | Status: DC | PRN

## 2012-08-01 MED ORDER — METOPROLOL SUCCINATE ER 25 MG PO TB24
25.0000 mg | ORAL_TABLET | Freq: Every day | ORAL | Status: DC
  Administered 2012-08-01: 25 mg via ORAL
  Filled 2012-08-01: qty 1

## 2012-08-01 MED ORDER — ONDANSETRON HCL 4 MG PO TABS: 4.0000 mg | ORAL_TABLET | Freq: Four times a day (QID) | ORAL | Status: DC | PRN

## 2012-08-01 MED ORDER — GLUCERNA SHAKE PO LIQD
237.0000 mL | Freq: Two times a day (BID) | ORAL | Status: DC
  Administered 2012-08-01: 237 mL via ORAL

## 2012-08-01 MED ORDER — BISACODYL 10 MG RE SUPP: 10.0000 mg | Freq: Every day | RECTAL | Status: DC | PRN

## 2012-08-01 MED ORDER — METOPROLOL SUCCINATE ER 25 MG PO TB24: 25.0000 mg | ORAL_TABLET | Freq: Every day | ORAL | Status: DC

## 2012-08-01 NOTE — Progress Notes (Signed)
Physical Therapy Treatment Patient Details Name: Curtis White MRN: [id number] DOB: 1921-12-27 Today's Date: 08/01/2012 Time: 4098-1191 PT Time Calculation (min): 23 min  PT Assessment / Plan / Recommendation Comments on Treatment Session  Patient able to increase mobility slowly today. Only complains of pain in weightbearing. Continue with current POC    Follow Up Recommendations  SNF     Does the patient have the potential to tolerate intense rehabilitation     Barriers to Discharge        Equipment Recommendations  None recommended by PT    Recommendations for Other Services    Frequency Min 3X/week   Plan Discharge plan remains appropriate;Frequency remains appropriate    Precautions / Restrictions Precautions Precautions: Fall Restrictions Weight Bearing Restrictions: Yes LLE Weight Bearing: Weight bearing as tolerated   Pertinent Vitals/Pain     Mobility  Bed Mobility Supine to Sit: 4: Min assist;HOB elevated Sitting - Scoot to Edge of Bed: 4: Min assist Details for Bed Mobility Assistance: A for LLE.  Transfers Sit to Stand: 4: Min assist;With upper extremity assist;From bed Stand to Sit: To chair/3-in-1;With armrests;With upper extremity assist;4: Min assist Details for Transfer Assistance: A for balance and stability. Patient wanting to pull up from RW requiring max cues to correct Ambulation/Gait Ambulation/Gait Assistance: 3: Mod assist Ambulation Distance (Feet): 15 Feet Assistive device: Rolling walker Ambulation/Gait Assistance Details: Assist to support body weight as patient attempting to step on LLE and increased weight. Cues for RW postioning and posture Gait Pattern: Step-to pattern;Decreased weight shift to left;Decreased stance time - left    Exercises General Exercises - Lower Extremity Quad Sets: AROM;Left;10 reps Heel Slides: AROM;Left;10 reps Hip ABduction/ADduction: AAROM;Left;10 reps   PT Diagnosis:    PT Problem List:   PT  Treatment Interventions:     PT Goals Acute Rehab PT Goals PT Goal: Supine/Side to Sit - Progress: Progressing toward goal PT Transfer Goal: Bed to Chair/Chair to Bed - Progress: Progressing toward goal PT Goal: Ambulate - Progress: Progressing toward goal  Visit Information  Last PT Received On: 08/01/12 Assistance Needed: +1    Subjective Data      Cognition  Cognition Overall Cognitive Status: Appears within functional limits for tasks assessed/performed Arousal/Alertness: Awake/alert Orientation Level: Appears intact for tasks assessed Behavior During Session: [facility] for tasks performed    Balance     End of Session PT - End of Session Equipment Utilized During Treatment: Gait belt Activity Tolerance: Patient tolerated treatment well;Patient limited by pain Patient left: in chair;with call bell/phone within reach Nurse Communication: Mobility status   GP     Fredrich Birks 08/01/2012, 11:36 AM 08/01/2012 Fredrich Birks PTA [phone] pager [phone] office

## 2012-08-01 NOTE — Discharge Summary (Signed)
Physician Discharge Summary  Patient ID: Curtis White MRN: [id number] DOB/AGE: 77-Apr-1923 77 y.o.  Admit date: 07/29/2012 Discharge date: 08/01/2012  Primary Care Physician:  [name], MD  Discharge Diagnoses:    . DIABETES MELLITUS, TYPE II, CONTROLLED . HYPERLIPIDEMIA-MIXED . HYPERTENSION, BENIGN . CAD, AUTOLOGOUS BYPASS GRAFT Left hip fracture status post left trochanteric compression screw  Consults: Orthopedics, Dr. [name]                   Cardiology, Dr. [name]  Discharge Instructions: Please check CBC tomorrow Aspirin for VTE prophylaxis  WBAT left leg,  dressing change daily or as needed.  Keep wound dry and clean.  May shower when no further drainage.  OV 2 weeks with Dr [name]   Discharge Medications:   Medication List    STOP taking these medications       benazepril 10 MG tablet  Commonly known as:  LOTENSIN      TAKE these medications       acetaminophen 325 MG tablet  Commonly known as:  TYLENOL  Take 2 tablets (650 mg total) by mouth every 6 (six) hours as needed.     alfuzosin 10 MG 24 hr tablet  Commonly known as:  UROXATRAL  Take 10 mg by mouth daily.     aspirin EC 325 MG tablet  Take 325 mg by mouth daily.     bisacodyl 10 MG suppository  Commonly known as:  DULCOLAX  Place 1 suppository (10 mg total) rectally daily as needed.     DSS 100 MG Caps  Take 100 mg by mouth 2 (two) times daily.     ergocalciferol 50000 UNITS capsule  Commonly known as:  VITAMIN D2  Take 50,000 Units by mouth every 14 (fourteen) days. 1st and 15th of each month     ezetimibe-simvastatin 10-10 MG per tablet  Commonly known as:  VYTORIN  Take 1 tablet by mouth at bedtime.     finasteride 5 MG tablet  Commonly known as:  PROSCAR  Take 5 mg by mouth daily.     glyBURIDE-metformin 5-500 MG per tablet  Commonly known as:  GLUCOVANCE  Take 2 tablets by mouth daily with breakfast.     HYDROcodone-acetaminophen 5-325 MG per tablet  Commonly  known as:  NORCO/VICODIN  Take 1-2 tablets by mouth every 6 (six) hours as needed for pain.     methocarbamol 500 MG tablet  Commonly known as:  ROBAXIN  Take 1 tablet (500 mg total) by mouth every 6 (six) hours as needed.     metoprolol succinate 25 MG 24 hr tablet  Commonly known as:  TOPROL-XL  Take 1 tablet (25 mg total) by mouth daily.     ondansetron 4 MG tablet  Commonly known as:  ZOFRAN  Take 1 tablet (4 mg total) by mouth every 6 (six) hours as needed for nausea.         Brief H and P: For complete details please refer to admission H and P, but in briefPleasant 79 y/o man who was at home this morning and slipped on the ice on the ramp to his house injuring his left hip. No LOC. Daughter was present and confirms this was a mechanical fall. XRay in the ED confirms a left intertrochanteric hip fracture.    Hospital Course:  Hip fracture, left: Patient was admitted and orthopedics was consulted. Patient underwent left trochanteric compression screw on 07/30/2012. Currently pain is controlled and patient is tolerating  diet. He was placed on Lovenox for DVT prophylaxis while inpatient and per orthopedics recommendation continue aspirin upon discharge. Please see further instructions as above.  DIABETES MELLITUS, TYPE II, CONTROLLED: Patient was continued on sliding scale insulin while inpatient   HYPERLIPIDEMIA-MIXED- Continue Vytorin   HYPERTENSION, BENIGN: During hospitalization patient was noticed to have borderline hypertension hence lisinopril was placed on hold and metoprolol was decreased as per cardiology recommendations  CAD, CABG, Chronic systolic CHF (congestive heart failure)- compensated. Cardiology consult was obtained and patient was cleared for surgery.    Day of Discharge BP 119/49  Pulse 97  Temp(Src) 98.6 F (37 C) (Oral)  Resp 18  Ht 5\' 10"  (1.778 m)  Wt 81.647 kg (180 lb)  BMI 25.83 kg/m2  SpO2 98%  Physical Exam:  General: AXOX3, oriented x3,  NAD.  CVS: S1-S2 clear, no mrg  Chest: CTA B, no wheezing, rales or rhonchi  Abdomen: soft NT, ND, NBS  Extremities: no cyanosis, clubbing or edema noted bilaterally, b/l SCD's  The results of significant diagnostics from this hospitalization (including imaging, microbiology, ancillary and laboratory) are listed below for reference.    LAB RESULTS: Basic Metabolic Panel:  Recent Labs Lab 07/31/12 0505 08/01/12 0545  NA 135 135  K 4.1 4.1  CL 103 101  CO2 25 27  GLUCOSE 216* 175*  BUN 16 14  CREATININE 0.63 0.66  CALCIUM 8.3* 8.6   Liver Function Tests:  Recent Labs Lab 07/29/12 1518  AST 17  ALT 17  ALKPHOS 84  BILITOT 0.4  PROT 6.6  ALBUMIN 3.7  CBC:  Recent Labs Lab 07/29/12 1518  07/31/12 0505 08/01/12 0545  WBC 11.9*  < > 7.8 6.1  NEUTROABS 10.5*  --   --   --   HGB 12.4*  < > 9.0* 8.4*  HCT 35.1*  < > 25.7* 23.7*  MCV 88.0  < > 85.3 87.1  PLT 163  < > 126* 124*  < > = values in this interval not displayed. Cardiac Enzymes: No results found for this basename: CKTOTAL, CKMB, CKMBINDEX, TROPONINI,  in the last 168 hours BNP: No components found with this basename: POCBNP,  CBG:  Recent Labs Lab 08/01/12 0659 08/01/12 1112  GLUCAP 194* 216*    Significant Diagnostic Studies:  Dg Chest 1 View  07/29/2012  *RADIOLOGY REPORT*  Clinical Data: Preop left hip fracture  CHEST - 1 VIEW  Comparison: 08/01/2010  Findings: Lungs are clear. No pleural effusion or pneumothorax.  Heart is top normal in size. Postsurgical changes related to prior CABG.  IMPRESSION: No evidence of acute cardiopulmonary disease.   Original Report Authenticated By: Charline Bills, M.D.    Dg Hip Complete Left  07/29/2012  *RADIOLOGY REPORT*  Clinical Data: Fall  LEFT HIP - COMPLETE 2+ VIEW  Comparison: None.  Findings: Intertrochanteric fracture of the left hip with angulation and mild displacement.  Mild degenerative change in the left hip joint.  IMPRESSION: Intertrochanteric  fracture left femur.   Original Report Authenticated By: Janeece Riggers, M.D.    Dg Hip Operative Left  07/30/2012  *RADIOLOGY REPORT*  Clinical Data: ORIF left hip.  Fluoroscopy time recorded as of 1 minute 12 seconds.  OPERATIVE LEFT HIP,PORTABLE PELVIS  Comparison: 07/29/2012  Findings: Intraoperative spot fluoroscopic images of the left hip are obtained for surgical control purposes demonstrating interval placement of N H measure Larry rod in the femur with distal locking screw and proximal compression bolt in the left hip transfixing the previous  intertrochanteric fractures.  Near anatomic alignment and position of the fracture fragments with mildly displaced lesser trochanteric fragment.  Subsequent portable views of the left hip demonstrate placement of the internal fixation hardware as described.  Visualized hardware components appear well seated.  Skin clips and subcutaneous emphysema consistent with recent surgery.  Degenerative changes noted in the right hip.  IMPRESSION: Internal fixation of intertrochanteric fractures of the left hip using intramedullary rod with distal locking screw and proximal compression vault.  Improved alignment and position of fracture fragments since prior preoperative study.   Original Report Authenticated By: Burman Nieves, M.D.    Dg Pelvis Portable  07/30/2012  *RADIOLOGY REPORT*  Clinical Data: ORIF left hip.  Fluoroscopy time recorded as of 1 minute 12 seconds.  OPERATIVE LEFT HIP,PORTABLE PELVIS  Comparison: 07/29/2012  Findings: Intraoperative spot fluoroscopic images of the left hip are obtained for surgical control purposes demonstrating interval placement of N H measure Larry rod in the femur with distal locking screw and proximal compression bolt in the left hip transfixing the previous intertrochanteric fractures.  Near anatomic alignment and position of the fracture fragments with mildly displaced lesser trochanteric fragment.  Subsequent portable views of the  left hip demonstrate placement of the internal fixation hardware as described.  Visualized hardware components appear well seated.  Skin clips and subcutaneous emphysema consistent with recent surgery.  Degenerative changes noted in the right hip.  IMPRESSION: Internal fixation of intertrochanteric fractures of the left hip using intramedullary rod with distal locking screw and proximal compression vault.  Improved alignment and position of fracture fragments since prior preoperative study.   Original Report Authenticated By: Burman Nieves, M.D.     2D ECHO:   Disposition and Follow-up:     Discharge Orders   Future Orders Complete By Expires     Diet Carb Modified  As directed     Discharge instructions  As directed     Comments:      Please check CBC tomorrow.    Increase activity slowly  As directed     Weight bearing as tolerated  As directed     Scheduling Instructions:      Left leg        DISPOSITION: Skilled nursing facility DIET: Carb modified diet ACTIVITY: As tolerated TESTS THAT NEED FOLLOW-UP CBC tomorrow  DISCHARGE FOLLOW-UP Follow-up Information   Follow up with [name], MD. Schedule an appointment as soon as possible for a visit in 2 weeks.   Contact information:   [address] [address] [place] [address] [phone]       Follow up with [name], MD. Schedule an appointment as soon as possible for a visit in 2 weeks. (for hospital follow-up)    Contact information:   [address] [place] [address] [phone]       Time spent on Discharge: 45 mins  Signed:   RAI,RIPUDEEP M.D. [facility] 08/01/2012, 12:01 PM Pager: [phone]

## 2012-08-01 NOTE — Progress Notes (Signed)
Patient Name: Curtis White Date of Encounter: 08/01/2012     Principal Problem:   Hip fracture, left Active Problems:   DIABETES MELLITUS, TYPE II, CONTROLLED   HYPERLIPIDEMIA-MIXED   HYPERTENSION, BENIGN   CAD, AUTOLOGOUS BYPASS GRAFT   Chronic systolic CHF (congestive heart failure)   Preop cardiovascular exam    SUBJECTIVE  Stable at present.  No chest pain.  ECG pending.    CURRENT MEDS . aspirin EC  325 mg Oral Q breakfast  . docusate sodium  100 mg Oral BID  . enoxaparin (LOVENOX) injection  40 mg Subcutaneous Q24H  . insulin aspart  0-9 Units Subcutaneous TID WC    OBJECTIVE  Filed Vitals:   07/31/12 2139 08/01/12 0000 08/01/12 0400 08/01/12 0550  BP: 105/47   119/49  Pulse: 68   97  Temp: 98.2 F (36.8 C)   98.6 F (37 C)  TempSrc:      Resp: 16 16 16 18   Height:      Weight:      SpO2: 93%   98%    Intake/Output Summary (Last 24 hours) at 08/01/12 0914 Last data filed at 08/01/12 0500  Gross per 24 hour  Intake    200 ml  Output   1050 ml  Net   -850 ml   Filed Weights   07/30/12 0255  Weight: 180 lb (81.647 kg)    PHYSICAL EXAM  General: Pleasant, NAD. Neuro: Alert and oriented X 3. Moves all extremities spontaneously. Psych: Normal affect. HEENT:  Normal  Neck: Supple without bruits or JVD. Lungs:  Resp regular and unlabored, CTA. Heart: RRR no s3, s4, or murmurs. Extremities: No clubbing, cyanosis or edema. DP/PT/Radials 2+ and equal bilaterally.  Accessory Clinical Findings  CBC  Recent Labs  07/29/12 1518  07/31/12 0505 08/01/12 0545  WBC 11.9*  < > 7.8 6.1  NEUTROABS 10.5*  --   --   --   HGB 12.4*  < > 9.0* 8.4*  HCT 35.1*  < > 25.7* 23.7*  MCV 88.0  < > 85.3 87.1  PLT 163  < > 126* 124*  < > = values in this interval not displayed. Basic Metabolic Panel  Recent Labs  07/31/12 0505 08/01/12 0545  NA 135 135  K 4.1 4.1  CL 103 101  CO2 25 27  GLUCOSE 216* 175*  BUN 16 14  CREATININE 0.63 0.66  CALCIUM  8.3* 8.6   Liver Function Tests  Recent Labs  07/29/12 1518  AST 17  ALT 17  ALKPHOS 84  BILITOT 0.4  PROT 6.6  ALBUMIN 3.7   No results found for this basename: LIPASE, AMYLASE,  in the last 72 hours Cardiac Enzymes No results found for this basename: CKTOTAL, CKMB, CKMBINDEX, TROPONINI,  in the last 72 hours BNP No components found with this basename: POCBNP,  D-Dimer No results found for this basename: DDIMER,  in the last 72 hours Hemoglobin A1C  Recent Labs  07/29/12 2039  HGBA1C 6.0*   Fasting Lipid Panel No results found for this basename: CHOL, HDL, LDLCALC, TRIG, CHOLHDL, LDLDIRECT,  in the last 72 hours Thyroid Function Tests No results found for this basename: TSH, T4TOTAL, FREET3, T3FREE, THYROIDAB,  in the last 72 hours  TELE  NSR  ECG  pending  Radiology/Studies  Dg Chest 1 View  07/29/2012  *RADIOLOGY REPORT*  Clinical Data: Preop left hip fracture  CHEST - 1 VIEW  Comparison: 08/01/2010  Findings: Lungs are clear.  No pleural effusion or pneumothorax.  Heart is top normal in size. Postsurgical changes related to prior CABG.  IMPRESSION: No evidence of acute cardiopulmonary disease.   Original Report Authenticated By: Charline Bills, M.D.    Dg Hip Complete Left  07/29/2012  *RADIOLOGY REPORT*  Clinical Data: Fall  LEFT HIP - COMPLETE 2+ VIEW  Comparison: None.  Findings: Intertrochanteric fracture of the left hip with angulation and mild displacement.  Mild degenerative change in the left hip joint.  IMPRESSION: Intertrochanteric fracture left femur.   Original Report Authenticated By: Janeece Riggers, M.D.    Dg Hip Operative Left  07/30/2012  *RADIOLOGY REPORT*  Clinical Data: ORIF left hip.  Fluoroscopy time recorded as of 1 minute 12 seconds.  OPERATIVE LEFT HIP,PORTABLE PELVIS  Comparison: 07/29/2012  Findings: Intraoperative spot fluoroscopic images of the left hip are obtained for surgical control purposes demonstrating interval placement of N H  measure Larry rod in the femur with distal locking screw and proximal compression bolt in the left hip transfixing the previous intertrochanteric fractures.  Near anatomic alignment and position of the fracture fragments with mildly displaced lesser trochanteric fragment.  Subsequent portable views of the left hip demonstrate placement of the internal fixation hardware as described.  Visualized hardware components appear well seated.  Skin clips and subcutaneous emphysema consistent with recent surgery.  Degenerative changes noted in the right hip.  IMPRESSION: Internal fixation of intertrochanteric fractures of the left hip using intramedullary rod with distal locking screw and proximal compression vault.  Improved alignment and position of fracture fragments since prior preoperative study.   Original Report Authenticated By: Burman Nieves, M.D.    Dg Pelvis Portable  07/30/2012  *RADIOLOGY REPORT*  Clinical Data: ORIF left hip.  Fluoroscopy time recorded as of 1 minute 12 seconds.  OPERATIVE LEFT HIP,PORTABLE PELVIS  Comparison: 07/29/2012  Findings: Intraoperative spot fluoroscopic images of the left hip are obtained for surgical control purposes demonstrating interval placement of N H measure Larry rod in the femur with distal locking screw and proximal compression bolt in the left hip transfixing the previous intertrochanteric fractures.  Near anatomic alignment and position of the fracture fragments with mildly displaced lesser trochanteric fragment.  Subsequent portable views of the left hip demonstrate placement of the internal fixation hardware as described.  Visualized hardware components appear well seated.  Skin clips and subcutaneous emphysema consistent with recent surgery.  Degenerative changes noted in the right hip.  IMPRESSION: Internal fixation of intertrochanteric fractures of the left hip using intramedullary rod with distal locking screw and proximal compression vault.  Improved alignment  and position of fracture fragments since prior preoperative study.   Original Report Authenticated By: Burman Nieves, M.D.     ASSESSMENT AND PLAN  1.  Stable at present.  Post surg CAD  Continue to observe.  ECG pending.  Will follow.    Signed, Shawnie Pons MD, [facility], FSCAI

## 2012-08-01 NOTE — Progress Notes (Signed)
Agree with student dietitian note.  Amiri Tritch, MS RD LDN Clinical Inpatient Dietitian Pager: [phone] Weekend/After hours pager: [phone]  

## 2012-08-01 NOTE — Progress Notes (Signed)
Occupational Therapy Note  OT order received and appreciated.  Per chart review (PT and LCSW notes), pt planning to go SNF.  Will defer OT services to next venue of care to further progress rehab.    08/01/2012 Cipriano Mile OTR/L Pager [phone] Office [phone]

## 2012-08-01 NOTE — Progress Notes (Signed)
NUTRITION FOLLOW UP  Intervention:   Glucerna Shake BID. Each supplement provides 220 kcal and 9.9 grams of protein.  Nutrition Dx:   Inadequate oral intake related to decreased appetite as evidenced by weight loss. - ongoing  Goal:   Pt to meet >/= 90% of estimated needs - unmet  Monitor:   Po intake, weight trends, labs (CBGs)   Assessment:   Pt is 79 yo male who slipped and fell on ice this am. Pt with resultant left hip fracture. Pt with PMH of hyperlipidemia, HYT, CAD, DM. While in ED, pt coughing during eating and felt as if there was something stuck in his throat. Pt with some clear emesis.   Pt s/p orthopedic surgery 3/18.  Pt with some weight loss this past year prior to admission.  Pt did not feel it was significant. Pt reports he tries to eat every couple of hours to keep his blood sugar stable.  Pt said he would like a Glucerna Shake to help him meet his needs.  Most recent po intake was 50%.    Height: Ht Readings from Last 1 Encounters:  07/30/12 5\' 10"  (1.778 m)    Weight Status:   Wt Readings from Last 1 Encounters:  07/30/12 180 lb (81.647 kg)    Re-estimated needs:  Kcal: 1950-2150 kcal  Protein: 115-130 gm  Fluid: 1.8-2 L   Skin: incision left hip   Diet Order: Carb Control   Intake/Output Summary (Last 24 hours) at 08/01/12 0957 Last data filed at 08/01/12 0500  Gross per 24 hour  Intake    200 ml  Output   1050 ml  Net   -850 ml    Last BM: 07/29/2012   Labs:   Recent Labs Lab 07/30/12 0510 07/31/12 0505 08/01/12 0545  NA 138 135 135  K 4.7 4.1 4.1  CL 103 103 101  CO2 26 25 27   BUN 17 16 14   CREATININE 0.69 0.63 0.66  CALCIUM 8.7 8.3* 8.6  GLUCOSE 183* 216* 175*    CBG (last 3)   Recent Labs  07/31/12 1631 07/31/12 2155 08/01/12 0659  GLUCAP 145* 175* 194*    Scheduled Meds: . aspirin EC  325 mg Oral Q breakfast  . docusate sodium  100 mg Oral BID  . enoxaparin (LOVENOX) injection  40 mg Subcutaneous Q24H  .  insulin aspart  0-9 Units Subcutaneous TID WC    Continuous Infusions:   Belenda Cruise  Dietetic Intern Pager: [phone]

## 2012-08-01 NOTE — Progress Notes (Signed)
Subjective: 2 Days Post-Op Procedure(s) (LRB): Left Trochanteric Compression Screw (Long) (Left) Patient reports pain as mild.   Tolerated out of bed yesterday   Vital signs in last 24 hours: Temp:  [98 F (36.7 C)-98.6 F (37 C)] 98.6 F (37 C) (03/20 0550) Pulse Rate:  [68-97] 97 (03/20 0550) Resp:  [14-18] 18 (03/20 0550) BP: (105-119)/(39-49) 119/49 mmHg (03/20 0550) SpO2:  [93 %-98 %] 98 % (03/20 0550)  Intake/Output from previous day: 03/19 0701 - 03/20 0700 In: 440 [P.O.:440] Out: 1050 [Urine:1050] Intake/Output this shift: Total I/O In: -  Out: 650 [Urine:650]   Recent Labs  07/29/12 1518 07/29/12 2039 07/30/12 0510 07/31/12 0505 08/01/12 0545  HGB 12.4* 11.4* 11.1* 9.0* 8.4*    Recent Labs  07/31/12 0505 08/01/12 0545  WBC 7.8 6.1  RBC 2.99* 2.72*  HCT 25.7* 23.7*  PLT 126* 124*    Recent Labs  07/31/12 0505 08/01/12 0545  NA 135 135  K 4.1 4.1  CL 103 101  CO2 25 27  BUN 16 14  CREATININE 0.63 0.66  GLUCOSE 216* 175*  CALCIUM 8.3* 8.6    Recent Labs  07/29/12 1518  INR 1.01    Neurovascular intact Sensation intact distally Dorsiflexion/Plantar flexion intact Incision: scant drainage  Assessment/Plan: 2 Days Post-Op Procedure(s) (LRB): Left Trochanteric Compression Screw (Long) (Left) Up with therapy Discharge to SNF when bed available Orthopedic instructions:  Aspirin for VTE prophylaxis, WBAT left leg, dressing change daily or as needed.  Keep wound dry and clean.  May shower when no further drainage.  OV 2 weeks with DR [name]. rx on chart for pain meds and asa  Tyriana Helmkamp M 08/01/2012, 10:27 AM

## 2012-08-06 ENCOUNTER — Encounter (HOSPITAL_COMMUNITY): Payer: Self-pay | Admitting: Orthopaedic Surgery

## 2012-08-15 ENCOUNTER — Encounter (HOSPITAL_COMMUNITY): Payer: Self-pay | Admitting: Pharmacy Technician

## 2012-08-15 ENCOUNTER — Encounter (HOSPITAL_COMMUNITY): Payer: Self-pay | Admitting: *Deleted

## 2012-08-15 ENCOUNTER — Other Ambulatory Visit (HOSPITAL_COMMUNITY): Payer: Self-pay | Admitting: Orthopaedic Surgery

## 2012-08-15 NOTE — Progress Notes (Signed)
08/15/12 1453  OBSTRUCTIVE SLEEP APNEA  Have you ever been diagnosed with sleep apnea through a sleep study? No  Do you snore loudly (loud enough to be heard through closed doors)?  0  Do you often feel tired, fatigued, or sleepy during the daytime? 1  Has anyone observed you stop breathing during your sleep? 0  Do you have, or are you being treated for high blood pressure? 1  BMI more than 35 kg/m2? 0  Age over 77 years old? 1  Neck circumference greater than 40 cm/18 inches? 0  Gender: 1  Obstructive Sleep Apnea Score 4

## 2012-08-16 ENCOUNTER — Inpatient Hospital Stay (HOSPITAL_COMMUNITY): Payer: Medicare Other

## 2012-08-16 ENCOUNTER — Inpatient Hospital Stay (HOSPITAL_COMMUNITY): Payer: Medicare Other | Admitting: Anesthesiology

## 2012-08-16 ENCOUNTER — Encounter (HOSPITAL_COMMUNITY)
Admission: RE | Disposition: A | Discharge status: Skilled Nursing Facility | Payer: Self-pay | Source: Ambulatory Visit | Attending: Orthopaedic Surgery

## 2012-08-16 ENCOUNTER — Encounter (HOSPITAL_COMMUNITY): Payer: Self-pay | Admitting: *Deleted

## 2012-08-16 ENCOUNTER — Inpatient Hospital Stay (HOSPITAL_COMMUNITY)
Admission: RE | Admit: 2012-08-16 | Discharge: 2012-08-19 | DRG: 903 | Disposition: A | Discharge status: Skilled Nursing Facility | Payer: Medicare Other | Source: Ambulatory Visit | Attending: Orthopaedic Surgery | Admitting: Orthopaedic Surgery

## 2012-08-16 ENCOUNTER — Encounter (HOSPITAL_COMMUNITY): Payer: Self-pay | Admitting: Anesthesiology

## 2012-08-16 DIAGNOSIS — IMO0002 Reserved for concepts with insufficient information to code with codable children: Principal | ICD-10-CM | POA: Diagnosis present

## 2012-08-16 DIAGNOSIS — N400 Enlarged prostate without lower urinary tract symptoms: Secondary | ICD-10-CM | POA: Diagnosis present

## 2012-08-16 DIAGNOSIS — E782 Mixed hyperlipidemia: Secondary | ICD-10-CM | POA: Diagnosis present

## 2012-08-16 DIAGNOSIS — Y831 Surgical operation with implant of artificial internal device as the cause of abnormal reaction of the patient, or of later complication, without mention of misadventure at the time of the procedure: Secondary | ICD-10-CM | POA: Diagnosis present

## 2012-08-16 DIAGNOSIS — I2510 Atherosclerotic heart disease of native coronary artery without angina pectoris: Secondary | ICD-10-CM | POA: Diagnosis present

## 2012-08-16 DIAGNOSIS — I10 Essential (primary) hypertension: Secondary | ICD-10-CM | POA: Diagnosis present

## 2012-08-16 DIAGNOSIS — E785 Hyperlipidemia, unspecified: Secondary | ICD-10-CM | POA: Diagnosis present

## 2012-08-16 DIAGNOSIS — E119 Type 2 diabetes mellitus without complications: Secondary | ICD-10-CM | POA: Diagnosis present

## 2012-08-16 DIAGNOSIS — S7002XA Contusion of left hip, initial encounter: Secondary | ICD-10-CM

## 2012-08-16 DIAGNOSIS — Z951 Presence of aortocoronary bypass graft: Secondary | ICD-10-CM

## 2012-08-16 HISTORY — PX: INCISION AND DRAINAGE HIP: SHX1801

## 2012-08-16 HISTORY — DX: Shortness of breath: R06.02

## 2012-08-16 HISTORY — DX: Inflammatory liver disease, unspecified: K75.9

## 2012-08-16 HISTORY — DX: Heart failure, unspecified: I50.9

## 2012-08-16 HISTORY — DX: Type 2 diabetes mellitus without complications: E11.9

## 2012-08-16 HISTORY — DX: Gastro-esophageal reflux disease without esophagitis: K21.9

## 2012-08-16 HISTORY — DX: Unspecified osteoarthritis, unspecified site: M19.90

## 2012-08-16 LAB — CBC WITH DIFFERENTIAL/PLATELET
Basophils Absolute: 0 10*3/uL (ref 0.0–0.1)
Eosinophils Relative: 2 % (ref 0–5)
Eosinophils Relative: 2 % (ref 0–5)
HCT: 27.9 % — ABNORMAL LOW (ref 39.0–52.0)
HCT: 27.6 % — ABNORMAL LOW (ref 39.0–52.0)
Lymphocytes Relative: 13 % (ref 12–46)
Lymphocytes Relative: 11 % — ABNORMAL LOW (ref 12–46)
Lymphs Abs: 0.8 10*3/uL (ref 0.7–4.0)
MCV: 84 fL (ref 78.0–100.0)
MCV: 84.4 fL (ref 78.0–100.0)
Monocytes Absolute: 0.5 10*3/uL (ref 0.1–1.0)
Monocytes Absolute: 0.8 10*3/uL (ref 0.1–1.0)
RBC: 3.27 MIL/uL — ABNORMAL LOW (ref 4.22–5.81)
RDW: 14 % (ref 11.5–15.5)
WBC: 6.6 10*3/uL (ref 4.0–10.5)
WBC: 7.4 10*3/uL (ref 4.0–10.5)

## 2012-08-16 LAB — URINE MICROSCOPIC-ADD ON

## 2012-08-16 LAB — GLUCOSE, CAPILLARY
Glucose-Capillary: 140 mg/dL — ABNORMAL HIGH (ref 70–99)
Glucose-Capillary: 171 mg/dL — ABNORMAL HIGH (ref 70–99)
Glucose-Capillary: 168 mg/dL — ABNORMAL HIGH (ref 70–99)

## 2012-08-16 LAB — URINALYSIS, ROUTINE W REFLEX MICROSCOPIC
Glucose, UA: NEGATIVE mg/dL
Hgb urine dipstick: NEGATIVE
Specific Gravity, Urine: 1.022 (ref 1.005–1.030)

## 2012-08-16 LAB — BASIC METABOLIC PANEL
CO2: 25 mEq/L (ref 19–32)
Calcium: 8.8 mg/dL (ref 8.4–10.5)
Chloride: 96 mEq/L (ref 96–112)
Potassium: 4.5 mEq/L (ref 3.5–5.1)
Sodium: 131 mEq/L — ABNORMAL LOW (ref 135–145)

## 2012-08-16 SURGERY — IRRIGATION AND DEBRIDEMENT HIP
Anesthesia: Choice | Site: Hip | Laterality: Left | Wound class: Dirty or Infected

## 2012-08-16 MED ORDER — ALFUZOSIN HCL ER 10 MG PO TB24
10.0000 mg | ORAL_TABLET | Freq: Every day | ORAL | Status: DC
Start: 2012-08-16 — End: 2012-08-19
  Administered 2012-08-16 – 2012-08-19 (×4): 10 mg via ORAL
  Filled 2012-08-16 (×4): qty 1

## 2012-08-16 MED ORDER — HYDROCODONE-ACETAMINOPHEN 5-325 MG PO TABS: 1.0000 | ORAL_TABLET | ORAL | Status: DC | PRN

## 2012-08-16 MED ORDER — INSULIN ASPART 100 UNIT/ML ~~LOC~~ SOLN: 0.0000 [IU] | Freq: Every day | SUBCUTANEOUS | Status: DC

## 2012-08-16 MED ORDER — GLYBURIDE 5 MG PO TABS
10.0000 mg | ORAL_TABLET | Freq: Every day | ORAL | Status: DC
  Administered 2012-08-17 – 2012-08-19 (×3): 10 mg via ORAL
  Filled 2012-08-16 (×4): qty 2

## 2012-08-16 MED ORDER — METHOCARBAMOL 500 MG PO TABS
500.0000 mg | ORAL_TABLET | Freq: Four times a day (QID) | ORAL | Status: DC | PRN
  Administered 2012-08-16 – 2012-08-18 (×4): 500 mg via ORAL
  Filled 2012-08-16 (×4): qty 1

## 2012-08-16 MED ORDER — ONDANSETRON HCL 4 MG PO TABS: 4.0000 mg | ORAL_TABLET | Freq: Four times a day (QID) | ORAL | Status: DC | PRN

## 2012-08-16 MED ORDER — LACTATED RINGERS IV SOLN
INTRAVENOUS | Status: DC
  Administered 2012-08-16: 11:00:00 via INTRAVENOUS

## 2012-08-16 MED ORDER — CEFAZOLIN SODIUM-DEXTROSE 2-3 GM-% IV SOLR
2.0000 g | Freq: Once | INTRAVENOUS | Status: AC
  Administered 2012-08-16: 2 g via INTRAVENOUS

## 2012-08-16 MED ORDER — EZETIMIBE-SIMVASTATIN 10-10 MG PO TABS: 1.0000 | ORAL_TABLET | Freq: Every day | ORAL | Status: DC

## 2012-08-16 MED ORDER — ONDANSETRON HCL 4 MG/2ML IJ SOLN
INTRAMUSCULAR | Status: DC | PRN
  Administered 2012-08-16: 4 mg via INTRAVENOUS

## 2012-08-16 MED ORDER — METHOCARBAMOL 500 MG PO TABS: 500.0000 mg | ORAL_TABLET | Freq: Four times a day (QID) | ORAL | Status: DC | PRN

## 2012-08-16 MED ORDER — PROPOFOL 10 MG/ML IV BOLUS
INTRAVENOUS | Status: DC | PRN
  Administered 2012-08-16: 150 mg via INTRAVENOUS

## 2012-08-16 MED ORDER — ONDANSETRON HCL 4 MG PO TABS
4.0000 mg | ORAL_TABLET | Freq: Four times a day (QID) | ORAL | Status: DC | PRN
  Administered 2012-08-18: 4 mg via ORAL
  Filled 2012-08-16: qty 1

## 2012-08-16 MED ORDER — VANCOMYCIN HCL 1000 MG IV SOLR
1000.0000 mg | Freq: Two times a day (BID) | INTRAVENOUS | Status: DC
  Administered 2012-08-16: 1000 mg via INTRAVENOUS
  Filled 2012-08-16 (×2): qty 1000

## 2012-08-16 MED ORDER — EZETIMIBE 10 MG PO TABS
10.0000 mg | ORAL_TABLET | Freq: Every day | ORAL | Status: DC
  Administered 2012-08-16 – 2012-08-18 (×3): 10 mg via ORAL
  Filled 2012-08-16 (×4): qty 1

## 2012-08-16 MED ORDER — SODIUM CHLORIDE 0.9 % IR SOLN
Status: DC | PRN
  Administered 2012-08-16: 3000 mL

## 2012-08-16 MED ORDER — ONDANSETRON HCL 4 MG/2ML IJ SOLN: 4.0000 mg | Freq: Four times a day (QID) | INTRAMUSCULAR | Status: DC | PRN

## 2012-08-16 MED ORDER — BISACODYL 10 MG RE SUPP: 10.0000 mg | RECTAL | Status: DC | PRN

## 2012-08-16 MED ORDER — METOCLOPRAMIDE HCL 5 MG/ML IJ SOLN: 5.0000 mg | Freq: Three times a day (TID) | INTRAMUSCULAR | Status: DC | PRN

## 2012-08-16 MED ORDER — EPHEDRINE SULFATE 50 MG/ML IJ SOLN
INTRAMUSCULAR | Status: DC | PRN
  Administered 2012-08-16: 10 mg via INTRAVENOUS

## 2012-08-16 MED ORDER — METHOCARBAMOL 100 MG/ML IJ SOLN
500.0000 mg | Freq: Four times a day (QID) | INTRAVENOUS | Status: DC | PRN
  Filled 2012-08-16: qty 5

## 2012-08-16 MED ORDER — VANCOMYCIN HCL IN DEXTROSE 750-5 MG/150ML-% IV SOLN
750.0000 mg | Freq: Two times a day (BID) | INTRAVENOUS | Status: DC
  Administered 2012-08-17 – 2012-08-19 (×5): 750 mg via INTRAVENOUS
  Filled 2012-08-16 (×6): qty 150

## 2012-08-16 MED ORDER — GLYBURIDE-METFORMIN 5-500 MG PO TABS: 2.0000 | ORAL_TABLET | Freq: Every day | ORAL | Status: DC

## 2012-08-16 MED ORDER — ASPIRIN EC 325 MG PO TBEC
325.0000 mg | DELAYED_RELEASE_TABLET | Freq: Every day | ORAL | Status: DC
  Administered 2012-08-16 – 2012-08-19 (×4): 325 mg via ORAL
  Filled 2012-08-16 (×5): qty 1

## 2012-08-16 MED ORDER — MORPHINE SULFATE 2 MG/ML IJ SOLN: 1.0000 mg | INTRAMUSCULAR | Status: DC | PRN

## 2012-08-16 MED ORDER — ACETAMINOPHEN 325 MG PO TABS
650.0000 mg | ORAL_TABLET | Freq: Four times a day (QID) | ORAL | Status: DC | PRN
  Administered 2012-08-19: 650 mg via ORAL
  Filled 2012-08-16: qty 2

## 2012-08-16 MED ORDER — DEXTROSE 5 % IV SOLN
INTRAVENOUS | Status: DC | PRN
  Administered 2012-08-16: 13:00:00 via INTRAVENOUS

## 2012-08-16 MED ORDER — VANCOMYCIN HCL IN DEXTROSE 1-5 GM/200ML-% IV SOLN
INTRAVENOUS | Status: AC
  Filled 2012-08-16: qty 200

## 2012-08-16 MED ORDER — CEFAZOLIN SODIUM-DEXTROSE 2-3 GM-% IV SOLR
INTRAVENOUS | Status: AC
  Filled 2012-08-16: qty 50

## 2012-08-16 MED ORDER — INSULIN ASPART 100 UNIT/ML ~~LOC~~ SOLN
0.0000 [IU] | Freq: Three times a day (TID) | SUBCUTANEOUS | Status: DC
  Administered 2012-08-16 – 2012-08-18 (×3): 3 [IU] via SUBCUTANEOUS
  Administered 2012-08-19: 2 [IU] via SUBCUTANEOUS
  Administered 2012-08-19: 3 [IU] via SUBCUTANEOUS

## 2012-08-16 MED ORDER — DIPHENHYDRAMINE HCL 12.5 MG/5ML PO ELIX: 12.5000 mg | ORAL_SOLUTION | ORAL | Status: DC | PRN

## 2012-08-16 MED ORDER — FLEET ENEMA 7-19 GM/118ML RE ENEM: 1.0000 | ENEMA | Freq: Once | RECTAL | Status: AC | PRN

## 2012-08-16 MED ORDER — LIDOCAINE HCL (CARDIAC) 20 MG/ML IV SOLN
INTRAVENOUS | Status: DC | PRN
  Administered 2012-08-16: 70 mg via INTRAVENOUS

## 2012-08-16 MED ORDER — FENTANYL CITRATE 0.05 MG/ML IJ SOLN
INTRAMUSCULAR | Status: DC | PRN
  Administered 2012-08-16 (×3): 50 ug via INTRAVENOUS

## 2012-08-16 MED ORDER — LACTATED RINGERS IV SOLN
INTRAVENOUS | Status: DC | PRN
  Administered 2012-08-16: 12:00:00 via INTRAVENOUS

## 2012-08-16 MED ORDER — SIMVASTATIN 10 MG PO TABS
10.0000 mg | ORAL_TABLET | Freq: Every day | ORAL | Status: DC
  Administered 2012-08-16 – 2012-08-18 (×3): 10 mg via ORAL
  Filled 2012-08-16 (×5): qty 1

## 2012-08-16 MED ORDER — OXYCODONE-ACETAMINOPHEN 5-325 MG PO TABS
1.0000 | ORAL_TABLET | ORAL | Status: DC | PRN
  Administered 2012-08-16: 1 via ORAL
  Administered 2012-08-17: 2 via ORAL
  Filled 2012-08-16: qty 1
  Filled 2012-08-16: qty 2
  Filled 2012-08-16: qty 1

## 2012-08-16 MED ORDER — HYDROMORPHONE HCL PF 1 MG/ML IJ SOLN
0.2500 mg | INTRAMUSCULAR | Status: DC | PRN
  Administered 2012-08-16: 0.5 mg via INTRAVENOUS

## 2012-08-16 MED ORDER — FINASTERIDE 5 MG PO TABS
5.0000 mg | ORAL_TABLET | Freq: Every day | ORAL | Status: DC
  Administered 2012-08-16 – 2012-08-19 (×4): 5 mg via ORAL
  Filled 2012-08-16 (×4): qty 1

## 2012-08-16 MED ORDER — METOCLOPRAMIDE HCL 10 MG PO TABS: 5.0000 mg | ORAL_TABLET | Freq: Three times a day (TID) | ORAL | Status: DC | PRN

## 2012-08-16 MED ORDER — HYDROMORPHONE HCL PF 1 MG/ML IJ SOLN
INTRAMUSCULAR | Status: AC
  Filled 2012-08-16: qty 1

## 2012-08-16 MED ORDER — DOCUSATE SODIUM 100 MG PO CAPS
100.0000 mg | ORAL_CAPSULE | Freq: Two times a day (BID) | ORAL | Status: DC
  Administered 2012-08-16 – 2012-08-19 (×6): 100 mg via ORAL
  Filled 2012-08-16 (×6): qty 1

## 2012-08-16 MED ORDER — HYDROCODONE-ACETAMINOPHEN 5-325 MG PO TABS
1.0000 | ORAL_TABLET | Freq: Four times a day (QID) | ORAL | Status: DC | PRN
  Administered 2012-08-18: 1 via ORAL
  Administered 2012-08-18: 2 via ORAL
  Filled 2012-08-16 (×2): qty 2

## 2012-08-16 MED ORDER — SUCCINYLCHOLINE CHLORIDE 20 MG/ML IJ SOLN
INTRAMUSCULAR | Status: DC | PRN
  Administered 2012-08-16: 100 mg via INTRAVENOUS

## 2012-08-16 MED ORDER — MUPIROCIN 2 % EX OINT: TOPICAL_OINTMENT | Freq: Two times a day (BID) | CUTANEOUS | Status: DC

## 2012-08-16 MED ORDER — METOPROLOL TARTRATE 25 MG PO TABS
25.0000 mg | ORAL_TABLET | Freq: Every day | ORAL | Status: DC
  Administered 2012-08-17 – 2012-08-19 (×3): 25 mg via ORAL
  Filled 2012-08-16 (×4): qty 1

## 2012-08-16 MED ORDER — POTASSIUM CHLORIDE IN NACL 20-0.45 MEQ/L-% IV SOLN
INTRAVENOUS | Status: DC
  Administered 2012-08-16: 19:00:00 via INTRAVENOUS
  Administered 2012-08-17: 75 mL/h via INTRAVENOUS
  Filled 2012-08-16 (×6): qty 1000

## 2012-08-16 MED ORDER — METFORMIN HCL 500 MG PO TABS
1000.0000 mg | ORAL_TABLET | Freq: Every day | ORAL | Status: DC
  Administered 2012-08-17 – 2012-08-19 (×3): 1000 mg via ORAL
  Filled 2012-08-16 (×4): qty 2

## 2012-08-16 SURGICAL SUPPLY — 44 items
BOWL SMART MIX CTS (DISPOSABLE) IMPLANT
CLOTH BEACON ORANGE TIMEOUT ST (SAFETY) ×2 IMPLANT
COVER SURGICAL LIGHT HANDLE (MISCELLANEOUS) ×2 IMPLANT
DRAPE ORTHO SPLIT 77X108 STRL (DRAPES) ×2
DRAPE SURG ORHT 6 SPLT 77X108 (DRAPES) ×2 IMPLANT
DRAPE U-SHAPE 47X51 STRL (DRAPES) ×2 IMPLANT
DRSG ADAPTIC 3X8 NADH LF (GAUZE/BANDAGES/DRESSINGS) ×2 IMPLANT
DRSG PAD ABDOMINAL 8X10 ST (GAUZE/BANDAGES/DRESSINGS) ×2 IMPLANT
DURAPREP 26ML APPLICATOR (WOUND CARE) ×2 IMPLANT
ELECT CAUTERY BLADE 6.4 (BLADE) IMPLANT
ELECT REM PT RETURN 9FT ADLT (ELECTROSURGICAL)
ELECTRODE REM PT RTRN 9FT ADLT (ELECTROSURGICAL) IMPLANT
GAUZE XEROFORM 5X9 LF (GAUZE/BANDAGES/DRESSINGS) ×2 IMPLANT
GLOVE BIOGEL PI IND STRL 7.5 (GLOVE) ×1 IMPLANT
GLOVE BIOGEL PI IND STRL 8 (GLOVE) ×1 IMPLANT
GLOVE BIOGEL PI INDICATOR 7.5 (GLOVE) ×1
GLOVE BIOGEL PI INDICATOR 8 (GLOVE) ×1
GLOVE ECLIPSE 7.0 STRL STRAW (GLOVE) ×2 IMPLANT
GLOVE ORTHO TXT STRL SZ7.5 (GLOVE) ×2 IMPLANT
GOWN PREVENTION PLUS LG XLONG (DISPOSABLE) IMPLANT
GOWN STRL NON-REIN LRG LVL3 (GOWN DISPOSABLE) ×2 IMPLANT
HANDPIECE INTERPULSE COAX TIP (DISPOSABLE)
KIT BASIN OR (CUSTOM PROCEDURE TRAY) ×2 IMPLANT
KIT ROOM TURNOVER OR (KITS) ×2 IMPLANT
MANIFOLD NEPTUNE II (INSTRUMENTS) ×2 IMPLANT
NS IRRIG 1000ML POUR BTL (IV SOLUTION) ×2 IMPLANT
PACK TOTAL JOINT (CUSTOM PROCEDURE TRAY) ×2 IMPLANT
PAD ARMBOARD 7.5X6 YLW CONV (MISCELLANEOUS) ×4 IMPLANT
SET HNDPC FAN SPRY TIP SCT (DISPOSABLE) IMPLANT
SPONGE GAUZE 4X4 12PLY (GAUZE/BANDAGES/DRESSINGS) ×2 IMPLANT
SPONGE LAP 18X18 X RAY DECT (DISPOSABLE) ×2 IMPLANT
STAPLER VISISTAT 35W (STAPLE) ×2 IMPLANT
SUT PDS AB 1 CT  36 (SUTURE)
SUT PDS AB 1 CT 36 (SUTURE) IMPLANT
SUT VIC AB 0 CT1 27 (SUTURE) ×2
SUT VIC AB 0 CT1 27XBRD ANBCTR (SUTURE) ×2 IMPLANT
SUT VIC AB 2-0 CT1 27 (SUTURE) ×2
SUT VIC AB 2-0 CT1 TAPERPNT 27 (SUTURE) ×2 IMPLANT
TAPE CLOTH SURG 6X10 WHT LF (GAUZE/BANDAGES/DRESSINGS) ×2 IMPLANT
TOWEL OR 17X24 6PK STRL BLUE (TOWEL DISPOSABLE) ×2 IMPLANT
TOWEL OR 17X26 10 PK STRL BLUE (TOWEL DISPOSABLE) ×2 IMPLANT
TUBE ANAEROBIC SPECIMEN COL (MISCELLANEOUS) IMPLANT
UNDERPAD 30X30 INCONTINENT (UNDERPADS AND DIAPERS) ×2 IMPLANT
WATER STERILE IRR 1000ML POUR (IV SOLUTION) ×2 IMPLANT

## 2012-08-16 NOTE — Anesthesia Preprocedure Evaluation (Addendum)
Anesthesia Evaluation  Patient identified by MRN, date of birth, ID band Patient awake    Reviewed: Allergy & Precautions, H&P , NPO status , Patient's Chart, lab work & pertinent test results  History of Anesthesia Complications Negative for: history of anesthetic complications  Airway Mallampati: II TM Distance: >3 FB Neck ROM: Full    Dental  (+) Edentulous Upper and Edentulous Lower   Pulmonary former smoker,  breath sounds clear to auscultation        Cardiovascular hypertension, Pt. on medications and Pt. on home beta blockers + CAD, + CABG and +CHF Rhythm:Regular Rate:Normal  EF 41%   Neuro/Psych negative neurological ROS  negative psych ROS   GI/Hepatic negative GI ROS, Neg liver ROS,   Endo/Other  diabetes, Well Controlled, Type 2, Oral Hypoglycemic Agents  Renal/GU negative Renal ROS     Musculoskeletal   Abdominal   Peds  Hematology negative hematology ROS (+)   Anesthesia Other Findings   Reproductive/Obstetrics negative OB ROS                         Anesthesia Physical Anesthesia Plan  ASA: III  Anesthesia Plan: General   Post-op Pain Management:    Induction: Intravenous  Airway Management Planned: Oral ETT  Additional Equipment:   Intra-op Plan:   Post-operative Plan: Extubation in OR  Informed Consent:   Dental advisory given  Plan Discussed with: CRNA, Anesthesiologist and Surgeon  Anesthesia Plan Comments:         Anesthesia Quick Evaluation

## 2012-08-16 NOTE — Anesthesia Procedure Notes (Signed)
Procedure Name: Intubation Date/Time: 08/16/2012 12:44 PM Performed by: Rogelia Boga Pre-anesthesia Checklist: Patient identified, Emergency Drugs available, Suction available, Patient being monitored and Timeout performed Patient Re-evaluated:Patient Re-evaluated prior to inductionOxygen Delivery Method: Circle system utilized Preoxygenation: Pre-oxygenation with 100% oxygen Intubation Type: IV induction Ventilation: Mask ventilation without difficulty Laryngoscope Size: Mac and 4 Grade View: Grade II Tube type: Oral Tube size: 7.5 mm Number of attempts: 1 Airway Equipment and Method: Stylet Placement Confirmation: ETT inserted through vocal cords under direct vision,  positive ETCO2 and breath sounds checked- equal and bilateral Secured at: 22 cm Tube secured with: Tape Dental Injury: Teeth and Oropharynx as per pre-operative assessment

## 2012-08-16 NOTE — Interval H&P Note (Signed)
History and Physical Interval Note:  08/16/2012 12:06 PM  Curtis White  has presented today for surgery, with the diagnosis of Status Post IM Nailing Left Intertroch Femur Fracture, Draining Hematoma Left Lateral Incision  The various methods of treatment have been discussed with the patient and family. After consideration of risks, benefits and other options for treatment, the patient has consented to  Procedure(s) with comments: IRRIGATION AND DEBRIDEMENT HIP (Left) - Irrigation and Debridement Left Hip as a surgical intervention .  The patient's history has been reviewed, patient examined, no change in status, stable for surgery.  I have reviewed the patient's chart and labs.  Questions were answered to the patient's satisfaction.     Shetara Launer C

## 2012-08-16 NOTE — Anesthesia Postprocedure Evaluation (Signed)
  Anesthesia Post-op Note  Patient: Curtis White  Procedure(s) Performed: Procedure(s) with comments: IRRIGATION AND DEBRIDEMENT HIP (Left) - Irrigation and Debridement Left Hip  Patient Location: PACU  Anesthesia Type:General  Level of Consciousness: awake, alert  and oriented  Airway and Oxygen Therapy: Patient Spontanous Breathing and Patient connected to nasal cannula oxygen  Post-op Pain: none  Post-op Assessment: Post-op Vital signs reviewed, Patient's Cardiovascular Status Stable, Respiratory Function Stable, Patent Airway, No signs of Nausea or vomiting, Adequate PO intake and Pain level controlled  Post-op Vital Signs: Reviewed and stable  Complications: No apparent anesthesia complications

## 2012-08-16 NOTE — Transfer of Care (Signed)
Immediate Anesthesia Transfer of Care Note  Patient: Curtis White  Procedure(s) Performed: Procedure(s) with comments: IRRIGATION AND DEBRIDEMENT HIP (Left) - Irrigation and Debridement Left Hip  Patient Location: PACU  Anesthesia Type:General  Level of Consciousness: awake, alert , oriented and patient cooperative  Airway & Oxygen Therapy: Patient Spontanous Breathing and Patient connected to nasal cannula oxygen  Post-op Assessment: Report given to PACU RN and Post -op Vital signs reviewed and stable  Post vital signs: Reviewed and stable  Complications: No apparent anesthesia complications

## 2012-08-16 NOTE — Progress Notes (Signed)
Patient ID: Curtis White, male   DOB: 13-Feb-1922, 79 y.o.   MRN: [id number] PLAN  ----    IV ABX until cultures return. Clinically hip wound did not look infected and if cultures neg then he can return to SNF and restart PT on Monday.

## 2012-08-16 NOTE — H&P (Signed)
Curtis White is an 79 y.o. male.   Chief Complaint:  Progressive drainage of left hip wound with fever. HPI:  S/P treatment of a left IT hip fracture with troch nail on 07/30/12.  Pt residing at [facility]. Brought to the office this past Tuesday with wound irritation around the staples.  No drainage at that time and no cellulitis.  Pt placed on Doxycycline and staples removed.  Yesterday pt returned to office with worsening of drainage from wound and increasing erythema.  Elevated temperatures noted from facility at max of >100.  In office pt afebrile.   Large amounts of serosanguinous drainage expressed from proximal left hip wound.  Radiographs show good position of hardware.  Pt presents today of I&D of the proximal left hip wound.  Past Medical History  Diagnosis Date  . Mixed hyperlipidemia   . Benign essential HTN   . CAD (coronary artery disease)     a. CABG 1983. b. Re-do CABG 1997. c. Grafts patent 2003.  . Diabetes mellitus     type II; controlled  . Hyperlipidemia   . Skin rash     of uncertain etiology  . Prostate enlargement   . CHF (congestive heart failure)     Past Surgical History  Procedure Laterality Date  . Left hydrocelectomy    . Fetal surgery for congenital hernia    . Compression hip screw Left 07/30/2012    Procedure: Left Trochanteric Compression Screw (Long);  Surgeon: [name], MD;  Location: [facility];  Service: Orthopedics;  Laterality: Left;    History reviewed. No pertinent family history. Social History:  reports that he has quit smoking. He does not have any smokeless tobacco history on file. He reports that he does not drink alcohol or use illicit drugs.  Allergies:  Allergies  Allergen Reactions  . Atorvastatin Other (See Comments)    Muscle weakness   . Pravastatin Sodium Other (See Comments)    Muscle weakness   . Rosuvastatin Other (See Comments)    Muscle weakness       Results for orders placed during the hospital  encounter of 08/16/12 (from the past 48 hour(s))  GLUCOSE, CAPILLARY     Status: Abnormal   Collection Time    08/16/12  9:13 AM      Result Value Range   Glucose-Capillary 171 (*) 70 - 99 mg/dL  SEDIMENTATION RATE     Status: Abnormal   Collection Time    08/16/12  9:37 AM      Result Value Range   Sed Rate 80 (*) 0 - 16 mm/hr  CBC WITH DIFFERENTIAL     Status: Abnormal   Collection Time    08/16/12  9:37 AM      Result Value Range   WBC 6.6  4.0 - 10.5 K/uL   RBC 3.32 (*) 4.22 - 5.81 MIL/uL   Hemoglobin 9.6 (*) 13.0 - 17.0 g/dL   HCT 16.1 (*) 09.6 - 04.5 %   MCV 84.0  78.0 - 100.0 fL   MCH 28.9  26.0 - 34.0 pg   MCHC 34.4  30.0 - 36.0 g/dL   RDW 40.9  81.1 - 91.4 %   Platelets 335  150 - 400 K/uL   Neutrophils Relative 77  43 - 77 %   Neutro Abs 5.1  1.7 - 7.7 K/uL   Lymphocytes Relative 13  12 - 46 %   Lymphs Abs 0.9  0.7 - 4.0 K/uL  Monocytes Relative 8  3 - 12 %   Monocytes Absolute 0.5  0.1 - 1.0 K/uL   Eosinophils Relative 2  0 - 5 %   Eosinophils Absolute 0.1  0.0 - 0.7 K/uL   Basophils Relative 1  0 - 1 %   Basophils Absolute 0.0  0.0 - 0.1 K/uL  PROTIME-INR     Status: None   Collection Time    08/16/12  9:37 AM      Result Value Range   Prothrombin Time 14.6  11.6 - 15.2 seconds   INR 1.16  0.00 - 1.49  URINALYSIS, ROUTINE W REFLEX MICROSCOPIC     Status: Abnormal   Collection Time    08/16/12 10:19 AM      Result Value Range   Color, Urine YELLOW  YELLOW   APPearance HAZY (*) CLEAR   Specific Gravity, Urine 1.022  1.005 - 1.030   pH 5.5  5.0 - 8.0   Glucose, UA NEGATIVE  NEGATIVE mg/dL   Hgb urine dipstick NEGATIVE  NEGATIVE   Bilirubin Urine NEGATIVE  NEGATIVE   Ketones, ur 15 (*) NEGATIVE mg/dL   Protein, ur NEGATIVE  NEGATIVE mg/dL   Urobilinogen, UA 1.0  0.0 - 1.0 mg/dL   Nitrite NEGATIVE  NEGATIVE   Leukocytes, UA MODERATE (*) NEGATIVE  URINE MICROSCOPIC-ADD ON     Status: Abnormal   Collection Time    08/16/12 10:19 AM      Result Value  Range   Squamous Epithelial / LPF RARE  RARE   WBC, UA 3-6  <3 WBC/hpf   RBC / HPF 0-2  <3 RBC/hpf   Bacteria, UA FEW (*) RARE   Casts HYALINE CASTS (*) NEGATIVE   Urine-Other MUCOUS PRESENT    CBC WITH DIFFERENTIAL     Status: Abnormal   Collection Time    08/16/12 10:27 AM      Result Value Range   WBC 7.4  4.0 - 10.5 K/uL   RBC 3.27 (*) 4.22 - 5.81 MIL/uL   Hemoglobin 9.6 (*) 13.0 - 17.0 g/dL   HCT 65.7 (*) 84.6 - 96.2 %   MCV 84.4  78.0 - 100.0 fL   MCH 29.4  26.0 - 34.0 pg   MCHC 34.8  30.0 - 36.0 g/dL   RDW 95.2  84.1 - 32.4 %   Platelets 329  150 - 400 K/uL   Neutrophils Relative 77  43 - 77 %   Neutro Abs 5.7  1.7 - 7.7 K/uL   Lymphocytes Relative 11 (*) 12 - 46 %   Lymphs Abs 0.8  0.7 - 4.0 K/uL   Monocytes Relative 10  3 - 12 %   Monocytes Absolute 0.8  0.1 - 1.0 K/uL   Eosinophils Relative 2  0 - 5 %   Eosinophils Absolute 0.1  0.0 - 0.7 K/uL   Basophils Relative 0  0 - 1 %   Basophils Absolute 0.0  0.0 - 0.1 K/uL  BASIC METABOLIC PANEL     Status: Abnormal   Collection Time    08/16/12 10:27 AM      Result Value Range   Sodium 131 (*) 135 - 145 mEq/L   Potassium 4.5  3.5 - 5.1 mEq/L   Chloride 96  96 - 112 mEq/L   CO2 25  19 - 32 mEq/L   Glucose, Bld 168 (*) 70 - 99 mg/dL   BUN 15  6 - 23 mg/dL   Creatinine, Ser 4.01  0.50 - 1.35 mg/dL   Calcium 8.8  8.4 - 16.1 mg/dL   GFR calc non Af Amer 88 (*) >90 mL/min   GFR calc Af Amer >90  >90 mL/min   Comment:            The eGFR has been calculated     using the CKD EPI equation.     This calculation has not been     validated in all clinical     situations.     eGFR's persistently     <90 mL/min signify     possible Chronic Kidney Disease.  GLUCOSE, CAPILLARY     Status: Abnormal   Collection Time    08/16/12 11:14 AM      Result Value Range   Glucose-Capillary 140 (*) 70 - 99 mg/dL   No results found.  Review of Systems  Constitutional: Positive for fever.       Low grade fever per nursing home  staff.  In office 4/3 pt was afebrile  HENT: Negative.   Eyes: Negative.   Respiratory: Negative.   Cardiovascular: Negative.   Gastrointestinal: Negative.   Genitourinary: Negative.   Musculoskeletal: Negative.        Wound of left hip with drainage for 5 days worsening   Skin: Negative.   Neurological: Negative.   Endo/Heme/Allergies: Negative.   Psychiatric/Behavioral: Positive for memory loss.    Blood pressure 146/77, pulse 93, temperature 97.9 F (36.6 C), temperature source Oral, resp. rate 18, SpO2 97.00%. Physical Exam  Constitutional: He appears well-developed and well-nourished.  HENT:  Head: Normocephalic and atraumatic.  Eyes: Pupils are equal, round, and reactive to light.  Neck: Normal range of motion. Neck supple.  Cardiovascular: Normal rate.   Respiratory: Effort normal.  GI: Soft.  Musculoskeletal:  Copious amounts of serosanguinous drainage from the proximal left hip wound.  No purulence.  No cellulitis.  Moderate edema of thigh around the incision with resolving ecchymosis.  Appears to be resolving, draining hematoma.  Neurological: He is alert.  Mildly confused, but redirected easily  Skin: There is erythema.  Minimal erythema about the incision line at the proximal hip wound.  No cellulitis .       Assessment/Plan Draining wound of left hip.  S/P trochanteric nailing of left IT fracutre  PLAN:  I&D of wound.  intraop cultures.  No Pre op abx.    YATES,MARK C 08/16/2012, 11:51 AM

## 2012-08-16 NOTE — Brief Op Note (Signed)
08/16/2012  1:43 PM  PATIENT:  Curtis White  79 y.o. male  PRE-OPERATIVE DIAGNOSIS:  Status Post IM Nailing Left Intertroch Femur Fracture, Draining Hematoma Left Lateral Incision  POST-OPERATIVE DIAGNOSIS:  Status Post IM Nailing Left Intertroch Femur Fracture, Draining Hematoma Left Lateral Incision  PROCEDURE:  Procedure(s) with comments: IRRIGATION AND DEBRIDEMENT HIP (Left) - Irrigation and Debridement Left Hip sharp scapel excision of skin, sub Q tissue. Evacuation of sub Q small hematoma and reclosure of wound. Cultures obtained.       SURGEON:  Surgeon(s) and Role:    * [name], MD - Primary  PHYSICIAN ASSISTANT:   ASSISTANTS: none   ANESTHESIA:   general  EBL:  Total I/O In: 750 [I.V.:750] Out: -   BLOOD ADMINISTERED:none  DRAINS: none   LOCAL MEDICATIONS USED:  NONE  SPECIMEN:  No Specimen    CULTURES  AEROBIC AND ANAEROBIC OBTAINED BEFORE IV ANCEF AND VANC  DISPOSITION OF SPECIMEN:  N/A  COUNTS:  YES  TOURNIQUET:  * No tourniquets in log *  DICTATION: .Other Dictation: Dictation Number [id number]  PLAN OF CARE: Admit to inpatient   PATIENT DISPOSITION:  PACU - hemodynamically stable.   Delay start of Pharmacological VTE agent (>24hrs) due to surgical blood loss or risk of bleeding:   Asa and SCD's   ( hip Hematoma  )

## 2012-08-16 NOTE — Progress Notes (Addendum)
ANTIBIOTIC CONSULT NOTE - INITIAL  Pharmacy Consult for Vancomycin Indication: L Hip infection  Allergies  Allergen Reactions  . Atorvastatin Other (See Comments)    Muscle weakness   . Pravastatin Sodium Other (See Comments)    Muscle weakness   . Rosuvastatin Other (See Comments)    Muscle weakness     Patient Measurements:   Total  Body Weight: 82kg IBW 73kg  Vital Signs: Temp: 98 F (36.7 C) (04/04 1652) Temp src: Oral (04/04 1652) BP: 130/61 mmHg (04/04 1652) Pulse Rate: 78 (04/04 1652) Intake/Output from previous day:   Intake/Output from this shift: Total I/O In: 950 [I.V.:950] Out: 225 [Urine:225]  Labs:  Recent Labs  08/16/12 0937 08/16/12 1027  WBC 6.6 7.4  HGB 9.6* 9.6*  PLT 335 329  CREATININE  --  0.59   The CrCl is unknown because both a height and weight (above a minimum accepted value) are required for this calculation. No results found for this basename: VANCOTROUGH, Leodis Binet, VANCORANDOM, GENTTROUGH, GENTPEAK, GENTRANDOM, TOBRATROUGH, TOBRAPEAK, TOBRARND, AMIKACINPEAK, AMIKACINTROU, AMIKACIN,  in the last 72 hours   Microbiology: Recent Results (from the past 720 hour(s))  MRSA PCR SCREENING     Status: None   Collection Time    07/30/12  6:42 AM      Result Value Range Status   MRSA by PCR NEGATIVE  NEGATIVE Final   Comment:            The GeneXpert MRSA Assay (FDA     approved for NASAL specimens     only), is one component of a     comprehensive MRSA colonization     surveillance program. It is not     intended to diagnose MRSA     infection nor to guide or     monitor treatment for     MRSA infections.    Medical History: Past Medical History  Diagnosis Date  . Mixed hyperlipidemia   . Benign essential HTN   . CAD (coronary artery disease)     a. CABG 1983. b. Re-do CABG 1997. c. Grafts patent 2003.  . Diabetes mellitus     type II; controlled  . Hyperlipidemia   . Skin rash     of uncertain etiology  . Prostate  enlargement   . CHF (congestive heart failure)      Assessment: 89yom  S/P treatment of a left IT hip fracture with troch nail on 07/30/12.  He was seen at MD office this week and stared on Doxy.   He returned to MD office 4/3 with worsening erythema, drainage and fever.  He is admitted for I&D and antibiotics.  Cx were obtained in OR.  Tc 98, WBC WNL, Cr 0/8 CrCl about 53ml/min  Goal of Therapy:  Vancomycin trough level 15-20 mcg/ml  Plan:  Vancomycin 1Gm x1 given preop about 1300 4/4 then  Vancomycin 750mg  IV q12hr  Leota Sauers Pharm.D. CPP, BCPS Clinical Pharmacist [phone] 08/16/2012 5:10 PM

## 2012-08-16 NOTE — Preoperative (Signed)
Beta Blockers   Reason not to administer Beta Blockers:Not Applicable 

## 2012-08-17 MED ORDER — GLUCERNA SHAKE PO LIQD: 237.0000 mL | ORAL | Status: DC | PRN

## 2012-08-17 NOTE — Op Note (Signed)
NAMENOVA, EVETT NO.:  [id number]  MEDICAL RECORD NO.:  [id number]  LOCATION:  5N25C                        FACILITY:  [facility]  PHYSICIAN:  Dannon Nguyenthi C. [name], M.D.    DATE OF BIRTH:  07-Jun-1921  DATE OF PROCEDURE:  08/16/2012 DATE OF DISCHARGE:                              OPERATIVE REPORT   PREOPERATIVE DIAGNOSIS:  Left hip hematoma.  POSTOPERATIVE DIAGNOSIS:  Left hip hematoma.  PROCEDURE:  Irrigation and debridement of left postop hip hematoma after intramedullary hip screw fixation 3 weeks ago.  Excisional debridement of skin and subcutaneous tissue, evacuation of hematoma, hip wound exploration and re-closure.  SURGEON:  Breeley Bischof C. [name], M.D.  ANESTHESIA:  General.  ESTIMATED BLOOD LOSS:  100 mL.  Anaerobic and anaerobic cultures obtained, left hip hematoma.  INDICATIONS:  This 79 year old male had a hip fracture surgery July 29, 2012, with intramedullary Biomet hip nail with satisfactory postoperative appearance.  This proximal wound has persistently had some serosanguineous drainage with erythema at the skin edges without evidence of purulence.  He has run low-grade fever and had been making slow progress with therapy.  His wound had been checked at least 3 times after surgery when he was transported from the nursing center, and he had been placed at the nursing center on some doxycycline.  Due to persistent drainage, there was concern that he could potentially develop hip infection.  He was brought in for debridement and reclosure.  The p.o. intake had been poor to fair at the nursing center.  PROCEDURE:  After induction of general anesthesia, orotracheal intubation, the patient was in the spine position, standard prepping with Betadine scrub and Betadine paint.  The area was squared with towels.  A Betadine Steri-Drape was applied and then split sheets and drapes.  Time-out procedure was completed.  Antibiotics were held until cultures were  obtained.  The patient had previous staples that been removed 2 weeks postop and there was area where it had some drainage from the incision.  There was slight erythema and with squeezing, small amount of serosanguineous fluid was obtained.  Taking 1-2 mm of skin on each side of the incision.  The old incision was ellipsed out including some subcutaneous tissue, which were sharply debrided.  Blunt spreading placement of a Weitlaner retractor after cutting and removing some 2-0 Vicryl sutures.  Immediately, aerobic and anaerobic cultures were obtained.  Small amount of old blood present.  This was evacuated with suction after cultures, and then Ancef was given followed by vancomycin by the CRNA.  Curetting and subcutaneous tissue was performed.  There was no purulence expressed.  Careful probing in all directions was performed.  Sutures in the gluteus medius tendon were then visualized. Squeezing of the hip from different positions did not reveal any pockets or areas with infection of purulence.  Vicryl sutures were removed from the gluteus medius.  Cerebellar retractor was placed.  This was spread. Finger tip was used to be able to palpate the tip of the trochanter.  It was visualized directly.  No purulence was seen.  Some scraping around with the curette revealed no evident abnormal tissue.  Copious irrigation with the pulsatile irrigator  was used followed by closure of the gluteus medius with #1 interrupted Vicryl.  Three interrupted sutures of 2-0 Vicryl, followed by 2-0 nylon.  The patient will be kept on IV antibiotics pending culture results.  This appeared that the patient had small amount of hematoma present with his elderly age, poor p.o. intake, he had poor healing and persistent drainage from the hip incision was present.  Urine culture was cleaned and white count was normal at 7400, with 77 neutrophils and 5.7 bands, 10 monos, and 2 eosinophils.  The lymphocytes were 11.   Basically unimpressive differential.  If cultures are negative and wounds looking good, he can be returned to the nursing center for resumption of therapy in 2 days.     Rayshad Riviello C. [name], M.D.     MCY/MEDQ  D:  08/16/2012  T:  08/17/2012  Job:  [id number]

## 2012-08-17 NOTE — Progress Notes (Signed)
Subjective: Pt stable - pain controlled   Objective: Vital signs in last 24 hours: Temp:  [97.9 F (36.6 C)-98.7 F (37.1 C)] 98.1 F (36.7 C) (04/05 0533) Pulse Rate:  [78-99] 90 (04/05 0533) Resp:  [11-20] 16 (04/05 0533) BP: (83-146)/(46-77) 83/47 mmHg (04/05 0533) SpO2:  [97 %-100 %] 97 % (04/05 0533)  Intake/Output from previous day: 04/04 0701 - 04/05 0700 In: 1190 [P.O.:240; I.V.:950] Out: 825 [Urine:825] Intake/Output this shift:    Exam:  Neurovascular intact Sensation intact distally Intact pulses distally  Labs:  Recent Labs  08/16/12 0937 08/16/12 1027  HGB 9.6* 9.6*    Recent Labs  08/16/12 0937 08/16/12 1027  WBC 6.6 7.4  RBC 3.32* 3.27*  HCT 27.9* 27.6*  PLT 335 329    Recent Labs  08/16/12 1027  NA 131*  K 4.5  CL 96  CO2 25  BUN 15  CREATININE 0.59  GLUCOSE 168*  CALCIUM 8.8    Recent Labs  08/16/12 0937  INR 1.16    Assessment/Plan: Pt stable - cxs pending - oob to chair   DEAN,GREGORY SCOTT 08/17/2012, 8:42 AM

## 2012-08-17 NOTE — Progress Notes (Signed)
INITIAL NUTRITION ASSESSMENT  DOCUMENTATION CODES Per approved criteria  -Not Applicable   INTERVENTION: 1. Glucerna Shake po PRN if pt has poor meal completion, each supplement provides 220 kcal and 10 grams of protein.  NUTRITION DIAGNOSIS: No nutrition dx at this time   Goal: PO intake to meet >/=90% estimated nutrition needs.   Monitor:  Po intake, weight trends, labs   Reason for Assessment: Malnutrition screening Tool  79 y.o. male  Admitting Dx: Hip hematoma, left  ASSESSMENT: Pt with recent admission for hip surgery post a fall on ice, was admitted from rehab at [facility] for I&D of same hip.   Pt was eating poorly at home prior to surgery, states that he has been eating well at SNF and not requiring any nutrition supplements. No new weight this admission.    Height: Ht Readings from Last 1 Encounters:  07/30/12 5\' 10"  (1.778 m)    Weight: Wt Readings from Last 1 Encounters:  07/30/12 180 lb (81.647 kg)    Ideal Body Weight: 166 lbs   % Ideal Body Weight: 108%  Wt Readings from Last 10 Encounters:  07/30/12 180 lb (81.647 kg)  07/30/12 180 lb (81.647 kg)  06/20/11 193 lb (87.544 kg)  01/02/11 193 lb (87.544 kg)  07/06/10 201 lb (91.173 kg)  07/04/10 197 lb (89.359 kg)  04/14/10 202 lb 8 oz (91.853 kg)  05/13/09 205 lb 8 oz (93.214 kg)    Usual Body Weight: ~200 lbs   % Usual Body Weight: 90%  BMI:  25.9 kg/(m^2) Overweight   Estimated Nutritional Needs: Kcal: 1800-2000 Protein: 90-100 gm  Fluid: 1.8-2 L   Skin: incision to L hip  Diet Order: Carb Control  EDUCATION NEEDS: -No education needs identified at this time   Intake/Output Summary (Last 24 hours) at 08/17/12 0834 Last data filed at 08/17/12 0534  Gross per 24 hour  Intake   1190 ml  Output    825 ml  Net    365 ml    Last BM: PTA    Labs:   Recent Labs Lab 08/16/12 1027  NA 131*  K 4.5  CL 96  CO2 25  BUN 15  CREATININE 0.59  CALCIUM 8.8  GLUCOSE 168*    CBG  (last 3)   Recent Labs  08/16/12 1652 08/16/12 2242 08/17/12 0651  GLUCAP 168* 165* 163*    Scheduled Meds: . alfuzosin  10 mg Oral Daily  . aspirin EC  325 mg Oral Daily  . docusate sodium  100 mg Oral BID  . ezetimibe  10 mg Oral QHS   And  . simvastatin  10 mg Oral q1800  . finasteride  5 mg Oral Daily  . glyBURIDE  10 mg Oral Q breakfast   And  . metFORMIN  1,000 mg Oral Q breakfast  . insulin aspart  0-15 Units Subcutaneous TID WC  . insulin aspart  0-5 Units Subcutaneous QHS  . metoprolol tartrate  25 mg Oral Daily  . vancomycin  750 mg Intravenous Q12H    Continuous Infusions: . 0.45 % NaCl with KCl 20 mEq / L 75 mL/hr at 08/16/12 1610    Past Medical History  Diagnosis Date  . Mixed hyperlipidemia   . Benign essential HTN   . CAD (coronary artery disease)     a. CABG 1983. b. Re-do CABG 1997. c. Grafts patent 2003.  Marland Kitchen Hyperlipidemia   . Skin rash     of uncertain etiology  . Prostate  enlargement     "epididymysis"  . CHF (congestive heart failure)   . Type II diabetes mellitus     controlled  . Shortness of breath     "before first heart surgery" (08/16/2012)  . GERD (gastroesophageal reflux disease)   . Hepatitis     "[employer] quit taking my blood cause they say I had this" (08/16/2012)  . Arthritis     "some, in my calves/legs I guess"  (08/16/2012)    Past Surgical History  Procedure Laterality Date  . Hydrocele excision Left   . Cardiac catheterization      "I think I've had 4" (08/16/2012)  . Compression hip screw Left 07/30/2012    Procedure: Left Trochanteric Compression Screw (Long);  Surgeon: [name], MD;  Location: [facility] OR;  Service: Orthopedics;  Laterality: Left;  . Tonsillectomy  1939  . Hernia repair Right 2012  . Coronary artery bypass graft  1983; ?1996    CABG X4; CABG X1  . Cataract extraction w/ intraocular lens  implant, bilateral Bilateral ~ 2000  . Incision and drainage hip Left 08/16/2012    hematoma (08/16/2012)    Clarene Duke RD, LDN Pager [phone] After Hours pager [phone]

## 2012-08-17 NOTE — Progress Notes (Signed)
Physical Therapy Evaluation Patient Details Name: Curtis White MRN: [id number] DOB: 1921/12/04 Today's Date: 08/17/2012 Time: 4098-1191 PT Time Calculation (min): 36 min  PT Assessment / Plan / Recommendation Clinical Impression  Pt is an 79 y/o s/p I &D of L hip fracture.  Pt was in SNF prior to admission and should be appropriate to return to SNF when cleared for d/c by MD.  Acute PT will continue to follow pt.     PT Assessment  Patient needs continued PT services    Follow Up Recommendations  SNF    Does the patient have the potential to tolerate intense rehabilitation      Barriers to Discharge Decreased caregiver support wife unable to assist pt    Equipment Recommendations  None recommended by PT    Recommendations for Other Services     Frequency Min 3X/week    Precautions / Restrictions Precautions Precautions: Fall Restrictions Weight Bearing Restrictions: Yes LLE Weight Bearing: Weight bearing as tolerated   Pertinent Vitals/Pain 8/10 pain in hip.  RN notified      Mobility  Bed Mobility Bed Mobility: Supine to Sit;Sitting - Scoot to Edge of Bed Supine to Sit: 4: Min assist;HOB flat Sitting - Scoot to [place] of Bed: 4: Min assist Details for Bed Mobility Assistance: A for LLE.  Transfers Transfers: Sit to Stand;Stand to Sit Sit to Stand: 4: Min assist;From bed;With upper extremity assist Stand to Sit: 4: Min assist;To chair/3-in-1;With upper extremity assist Details for Transfer Assistance: Vcs for hand placement and assist to steady pt.   Ambulation/Gait Ambulation/Gait Assistance: 3: Mod assist Ambulation Distance (Feet): 5 Feet Assistive device: Rolling walker Ambulation/Gait Assistance Details: assist to support pt's body weight for pt to advance RLE secondary to pain on LLE Gait Pattern: Step-to pattern;Decreased weight shift to left;Decreased stance time - left Stairs: No    Exercises     PT Diagnosis: Difficulty walking;Generalized  weakness;Acute pain  PT Problem List: Decreased strength;Decreased range of motion;Decreased activity tolerance;Decreased mobility;Pain PT Treatment Interventions: Gait training;Stair training;DME instruction;Functional mobility training;Therapeutic activities;Patient/family education   PT Goals Acute Rehab PT Goals PT Goal Formulation: With patient Time For Goal Achievement: 08/14/12 Potential to Achieve Goals: Good Pt will go Supine/Side to Sit: with supervision PT Goal: Supine/Side to Sit - Progress: Goal set today Pt will go Sit to Supine/Side: with supervision PT Goal: Sit to Supine/Side - Progress: Goal set today Pt will Transfer Bed to Chair/Chair to Bed: with supervision PT Transfer Goal: Bed to Chair/Chair to Bed - Progress: Goal set today Pt will Ambulate: 51 - 150 feet;with supervision;with rolling walker PT Goal: Ambulate - Progress: Goal set today  Visit Information  Last PT Received On: 08/17/12    Subjective Data  Subjective: Agree to PT eval     Prior Functioning  Home Living Lives With: Spouse;Daughter Available Help at Discharge: Skilled Nursing Facility Prior Function Level of Independence: Needs assistance Able to Take Stairs?: No Driving: No Vocation: Retired Musician: [employer] Dominant Hand: Right    Cognition  Cognition Overall Cognitive Status: Appears within functional limits for tasks assessed/performed Arousal/Alertness: Awake/alert Orientation Level: Appears intact for tasks assessed Behavior During Session: [name]    Extremity/Trunk Assessment Right Lower Extremity Assessment RLE ROM/Strength/Tone: [name] Left Lower Extremity Assessment LLE ROM/Strength/Tone: Unable to fully assess   Balance    End of Session PT - End of Session Equipment Utilized During Treatment: Gait belt Activity Tolerance: Patient limited by pain Patient  left: in chair;with call bell/phone within reach Nurse  Communication: Mobility status  GP     Curtis White 08/17/2012, 4:54 PM Curtis White L. Curtis White DPT [phone]

## 2012-08-18 LAB — URINE CULTURE: Colony Count: 55000

## 2012-08-18 LAB — GLUCOSE, CAPILLARY
Glucose-Capillary: 193 mg/dL — ABNORMAL HIGH (ref 70–99)
Glucose-Capillary: 247 mg/dL — ABNORMAL HIGH (ref 70–99)

## 2012-08-18 NOTE — Progress Notes (Signed)
Subjective: Pt sitting at bed shaving - minimal pain   Objective: Vital signs in last 24 hours: Temp:  [98.2 F (36.8 C)-98.6 F (37 C)] 98.3 F (36.8 C) (04/06 0553) Pulse Rate:  [91-94] 91 (04/06 0553) Resp:  [18] 18 (04/06 0553) BP: (118-125)/(51-53) 118/53 mmHg (04/06 0553) SpO2:  [96 %-98 %] 98 % (04/06 0553) Weight:  [81.647 kg (180 lb)] 81.647 kg (180 lb) (04/05 1500)  Intake/Output from previous day: 04/05 0701 - 04/06 0700 In: 1040 [P.O.:1040] Out: 2000 [Urine:2000] Intake/Output this shift:    Exam:  Dorsiflexion/Plantar flexion intact No cellulitis present  Labs:  Recent Labs  08/16/12 0937 08/16/12 1027  HGB 9.6* 9.6*    Recent Labs  08/16/12 0937 08/16/12 1027  WBC 6.6 7.4  RBC 3.32* 3.27*  HCT 27.9* 27.6*  PLT 335 329    Recent Labs  08/16/12 1027  NA 131*  K 4.5  CL 96  CO2 25  BUN 15  CREATININE 0.59  GLUCOSE 168*  CALCIUM 8.8    Recent Labs  08/16/12 0937  INR 1.16    Assessment/Plan: Pt with mild pain - on vanco - placement this week   DEAN,GREGORY SCOTT 08/18/2012, 10:09 AM

## 2012-08-18 NOTE — Clinical Social Work Psychosocial (Signed)
Clinical Social Work Department BRIEF PSYCHOSOCIAL ASSESSMENT 08/18/2012  Patient:  Curtis White, Curtis White     Account Number:  [id number]     Admit date:  08/16/2012  Clinical Social Worker:  Skip Mayer  Date/Time:  08/18/2012 12:00 N  Referred by:  Physician  Date Referred:  08/18/2012 Referred for  SNF Placement   Other Referral:   Interview type:  Patient Other interview type:    PSYCHOSOCIAL DATA Living Status:  FACILITY Admitted from facility:  [facility] [place] Level of care:  Skilled Nursing Facility Primary support name:  renee Primary support relationship to patient:  CHILD, ADULT Degree of support available:   Adequate    CURRENT CONCERNS Current Concerns  Post-Acute Placement   Other Concerns:    SOCIAL WORK ASSESSMENT / PLAN CSW met with pt re: d/c plan.  Pt admitted from [facility] and reports plan is for him to return upon d/c to continue rehab.  CSW confirmed with [facility] RN that pt is able to return at d/c.  FL2 complete and on chart.  CSW will continue to follow.   Assessment/plan status:  Information/Referral to [facility] Other assessment/ plan:   Information/referral to community resources:   SNF  PTAR    PATIENT'S/FAMILY'S RESPONSE TO PLAN OF CARE: Pt reports agreeable to return to SNF for continued rehab.  Pt verbalized understanding of d/c plan and appreciation for CSW assist.        Dellie Burns, MSW, LCSWA [phone] (Weekends 8:00am-4:30pm)

## 2012-08-19 ENCOUNTER — Encounter (HOSPITAL_COMMUNITY): Payer: Self-pay | Admitting: Orthopaedic Surgery

## 2012-08-19 LAB — CULTURE, ROUTINE-ABSCESS: Gram Stain: NONE SEEN

## 2012-08-19 LAB — GLUCOSE, CAPILLARY: Glucose-Capillary: 166 mg/dL — ABNORMAL HIGH (ref 70–99)

## 2012-08-19 MED ORDER — HYDROCODONE-ACETAMINOPHEN 5-325 MG PO TABS: 1.0000 | ORAL_TABLET | Freq: Four times a day (QID) | ORAL | Status: DC | PRN

## 2012-08-19 NOTE — Progress Notes (Signed)
OK per MD for d/c today back to [facility]. Ok per patient, daughter, SNF and pt's nurse. EMS to transport.  Patient and daughter are pleased with d/c plan. CSW signing off.  Lorri Frederick. [name]  [phone]

## 2012-08-19 NOTE — Progress Notes (Signed)
UR COMPLETED  

## 2012-08-19 NOTE — Progress Notes (Signed)
Pt discharged to [facility]. Report called to RN at [facility]. Pts IV was removed prior to discharge. Pt left unit in a stable condition via EMS.

## 2012-08-19 NOTE — Progress Notes (Signed)
OT Cancellation Note  Patient Details Name: COLEBY YETT MRN: [id number] DOB: [birth year]   Cancelled Treatment:    Reason Eval/Treat Not Completed: Other (comment) (screen)  Order received, reviewed chart. Pt for transfer to SNF for rehab. No acute OT needs identified. Will sign off.     Earlie Raveling OTR/L [phone]  08/19/2012, 1:52 PM

## 2012-08-19 NOTE — Discharge Summary (Signed)
Physician Discharge Summary  Patient ID: Curtis White MRN: [id number] DOB/AGE: 77-Jul-1923 77 y.o.  Admit date: 08/16/2012 Discharge date: 08/19/2012  Admission Diagnoses:  Hip hematoma, left  Discharge Diagnoses:  Principal Problem:   Hip hematoma, left   Past Medical History  Diagnosis Date  . Mixed hyperlipidemia   . Benign essential HTN   . CAD (coronary artery disease)     a. CABG 1983. b. Re-do CABG 1997. c. Grafts patent 2003.  Marland Kitchen Hyperlipidemia   . Skin rash     of uncertain etiology  . Prostate enlargement     "epididymysis"  . CHF (congestive heart failure)   . Type II diabetes mellitus     controlled  . Shortness of breath     "before first heart surgery" (08/16/2012)  . GERD (gastroesophageal reflux disease)   . Hepatitis     "[employer] quit taking my blood cause they say I had this" (08/16/2012)  . Arthritis     "some, in my calves/legs I guess"  (08/16/2012)    Surgeries: Procedure(s): IRRIGATION AND DEBRIDEMENT LEFT HIP HEMATOMA on 08/16/2012   Consultants (if any):  NONE  Discharged Condition: Improved  Hospital Course: Curtis White is an 79 y.o. male who was admitted 08/16/2012 with a diagnosis of Hip hematoma, left and went to the operating room on 08/16/2012 and underwent the above named procedures.    He was given perioperative antibiotics:  Anti-infectives   Start     Dose/Rate Route Frequency Ordered Stop   08/17/12 0200  vancomycin (VANCOCIN) IVPB 750 mg/150 ml premix     750 mg 150 mL/hr over 60 Minutes Intravenous Every 12 hours 08/16/12 1712     08/16/12 1230  vancomycin (VANCOCIN) 1,000 mg in sodium chloride 0.9 % 250 mL IVPB  Status:  Discontinued     1,000 mg 250 mL/hr over 60 Minutes Intravenous Every 12 hours 08/16/12 1228 08/16/12 1651   08/16/12 1230  ceFAZolin (ANCEF) IVPB 2 g/50 mL premix     2 g 100 mL/hr over 30 Minutes Intravenous  Once 08/16/12 1228 08/16/12 1306   08/16/12 1230  ceFAZolin (ANCEF) 2-3 GM-% IVPB SOLR    Comments:   MUELLER, TOM: cabinet override      08/16/12 1230 08/17/12 0044   08/16/12 1230  vancomycin (VANCOCIN) 1 GM/200ML IVPB    Comments:  MUELLER, TOM: cabinet override      08/16/12 1230 08/17/12 0044    .  He was given sequential compression devices, early ambulation, and ASPIRIN for DVT prophylaxis. Pt"s wound was without drainage during hospital stay.  All intraop cultures were negative for growth. Pt able to return to the facility without antibiotics. Will resume rehab for hip fracture. Pt stable.  He benefited maximally from the hospital stay and there were no complications.    Recent vital signs:  Filed Vitals:   08/19/12 0555  BP: 125/54  Pulse: 102  Temp: 98.3 F (36.8 C)  Resp: 16    Recent laboratory studies:  Lab Results  Component Value Date   HGB 9.6* 08/16/2012   HGB 9.6* 08/16/2012   HGB 8.4* 08/01/2012   Lab Results  Component Value Date   WBC 7.4 08/16/2012   PLT 329 08/16/2012   Lab Results  Component Value Date   INR 1.16 08/16/2012   Lab Results  Component Value Date   NA 131* 08/16/2012   K 4.5 08/16/2012   CL 96 08/16/2012   CO2 25 08/16/2012  BUN 15 08/16/2012   CREATININE 0.59 08/16/2012   GLUCOSE 168* 08/16/2012    Discharge Medications:     Medication List    STOP taking these medications       doxycycline 100 MG tablet  Commonly known as:  VIBRA-TABS      TAKE these medications       acetaminophen 325 MG tablet  Commonly known as:  TYLENOL  Take 650 mg by mouth every 6 (six) hours as needed for pain.     alfuzosin 10 MG 24 hr tablet  Commonly known as:  UROXATRAL  Take 10 mg by mouth daily.     aspirin EC 325 MG tablet  Take 325 mg by mouth daily.     bisacodyl 10 MG suppository  Commonly known as:  DULCOLAX  Place 10 mg rectally as needed for constipation (if no results in 2 hrs, Give a Soap Suds Enema).     DSS 100 MG Caps  Take 100 mg by mouth 2 (two) times daily.     ergocalciferol 50000 UNITS capsule  Commonly known as:  VITAMIN  D2  Take 50,000 Units by mouth every 14 (fourteen) days. 1st and 15th of each month     ezetimibe-simvastatin 10-10 MG per tablet  Commonly known as:  VYTORIN  Take 1 tablet by mouth at bedtime.     finasteride 5 MG tablet  Commonly known as:  PROSCAR  Take 5 mg by mouth daily.     glyBURIDE-metformin 5-500 MG per tablet  Commonly known as:  GLUCOVANCE  Take 2 tablets by mouth daily with breakfast.     HYDROcodone-acetaminophen 5-325 MG per tablet  Commonly known as:  NORCO/VICODIN  Take 1-2 tablets by mouth every 6 (six) hours as needed.     HYDROcodone-acetaminophen 5-325 MG per tablet  Commonly known as:  NORCO/VICODIN  Take 1-2 tablets by mouth every 6 (six) hours as needed for pain (for mild pain= take 1 tablet.  for moderate otr severe pain= take 2 tablets).     methocarbamol 500 MG tablet  Commonly known as:  ROBAXIN  Take 500 mg by mouth every 6 (six) hours as needed (for muscle spasms).     metoprolol tartrate 25 MG tablet  Commonly known as:  LOPRESSOR  Take 25 mg by mouth daily.     ondansetron 4 MG tablet  Commonly known as:  ZOFRAN  Take 1 tablet (4 mg total) by mouth every 6 (six) hours as needed for nausea.     PRESCRIPTION MEDICATION  Place 1 enema rectally daily as needed (for constipation Give enema ONLY if no results for dulcolax suppository).        Diagnostic Studies: Dg Chest 1 View  07/29/2012  *RADIOLOGY REPORT*  Clinical Data: Preop left hip fracture  CHEST - 1 VIEW  Comparison: 08/01/2010  Findings: Lungs are clear. No pleural effusion or pneumothorax.  Heart is top normal in size. Postsurgical changes related to prior CABG.  IMPRESSION: No evidence of acute cardiopulmonary disease.   Original Report Authenticated By: Charline Bills, M.D.    Dg Hip Complete Left  08/16/2012  *RADIOLOGY REPORT*  Clinical Data: ORIF of the left hip fracture.  LEFT HIP - COMPLETE 2+ VIEW  Comparison: 07/30/2012  Findings: Compared to the immediate postoperative  images, alignment of the gamma nail and intramedullary rod appear stable.  There is some interval evidence of partial healing at the level of the intertrochanteric hip fracture.  No new fractures are identified.  IMPRESSION: Stable alignment and positioning of the left-sided gamma nail and intramedullary rod.   Original Report Authenticated By: Irish Lack, M.D.    Dg Hip Complete Left  07/29/2012  *RADIOLOGY REPORT*  Clinical Data: Fall  LEFT HIP - COMPLETE 2+ VIEW  Comparison: None.  Findings: Intertrochanteric fracture of the left hip with angulation and mild displacement.  Mild degenerative change in the left hip joint.  IMPRESSION: Intertrochanteric fracture left femur.   Original Report Authenticated By: Janeece Riggers, M.D.    Dg Hip Operative Left  07/30/2012  *RADIOLOGY REPORT*  Clinical Data: ORIF left hip.  Fluoroscopy time recorded as of 1 minute 12 seconds.  OPERATIVE LEFT HIP,PORTABLE PELVIS  Comparison: 07/29/2012  Findings: Intraoperative spot fluoroscopic images of the left hip are obtained for surgical control purposes demonstrating interval placement of N H measure Larry rod in the femur with distal locking screw and proximal compression bolt in the left hip transfixing the previous intertrochanteric fractures.  Near anatomic alignment and position of the fracture fragments with mildly displaced lesser trochanteric fragment.  Subsequent portable views of the left hip demonstrate placement of the internal fixation hardware as described.  Visualized hardware components appear well seated.  Skin clips and subcutaneous emphysema consistent with recent surgery.  Degenerative changes noted in the right hip.  IMPRESSION: Internal fixation of intertrochanteric fractures of the left hip using intramedullary rod with distal locking screw and proximal compression vault.  Improved alignment and position of fracture fragments since prior preoperative study.   Original Report Authenticated By: Burman Nieves, M.D.    Dg Pelvis Portable  07/30/2012  *RADIOLOGY REPORT*  Clinical Data: ORIF left hip.  Fluoroscopy time recorded as of 1 minute 12 seconds.  OPERATIVE LEFT HIP,PORTABLE PELVIS  Comparison: 07/29/2012  Findings: Intraoperative spot fluoroscopic images of the left hip are obtained for surgical control purposes demonstrating interval placement of N H measure Larry rod in the femur with distal locking screw and proximal compression bolt in the left hip transfixing the previous intertrochanteric fractures.  Near anatomic alignment and position of the fracture fragments with mildly displaced lesser trochanteric fragment.  Subsequent portable views of the left hip demonstrate placement of the internal fixation hardware as described.  Visualized hardware components appear well seated.  Skin clips and subcutaneous emphysema consistent with recent surgery.  Degenerative changes noted in the right hip.  IMPRESSION: Internal fixation of intertrochanteric fractures of the left hip using intramedullary rod with distal locking screw and proximal compression vault.  Improved alignment and position of fracture fragments since prior preoperative study.   Original Report Authenticated By: Burman Nieves, M.D.     Disposition: 03-Skilled Nursing Facility      Discharge Orders   Future Orders Complete By Expires     Call MD / Call 911  As directed     Comments:      If you experience chest pain or shortness of breath, CALL 911 and be transported to the hospital emergency room.  If you develope a fever above 101 F, pus (white drainage) or increased drainage or redness at the wound, or calf pain, call your surgeon's office.    Constipation Prevention  As directed     Comments:      Drink plenty of fluids.  Prune juice may be helpful.  You may use a stool softener, such as Colace (over the counter) 100 mg twice a day.  Use MiraLax (over the counter) for constipation as needed.    Diet -  low sodium heart  healthy  As directed     Follow the hip precautions as taught in Physical Therapy  As directed     Increase activity slowly as tolerated  As directed      Change dressing daily.  Keep wound covered and dry at all times. Ice to thigh daily to help with swelling.   Weight bear as tolerated for ambulation with walker. PT and OT daily. Aspirin for VTE prophylaxis  Follow-up Information   Follow up with [name], MD. Schedule an appointment as soon as possible for a visit in 1 week.   Contact information:   [address] [place] [address] [phone]        Signed: Wende Neighbors 08/19/2012, 11:00 AM  All cultures were neg. No evidence at surgery or on cultures of any infection. subQ hematoma found and evacuated and reclosed.

## 2012-08-19 NOTE — Progress Notes (Signed)
Subjective: 3 Days Post-Op Procedure(s) (LRB): IRRIGATION AND DEBRIDEMENT HIP (Left) Patient reports pain as mild.  Not much activity.  Tolerated out of bed to chair Cultures with no growth.  Findings intraop c/w hematoma Ready for transfer back to [facility].  No abx needed.  Objective: Vital signs in last 24 hours: Temp:  [98.2 F (36.8 C)-98.4 F (36.9 C)] 98.3 F (36.8 C) (04/07 0555) Pulse Rate:  [79-102] 102 (04/07 0555) Resp:  [16] 16 (04/07 0555) BP: (114-125)/(47-57) 125/54 mmHg (04/07 0555) SpO2:  [96 %-100 %] 97 % (04/07 0555)  Intake/Output from previous day: 04/06 0701 - 04/07 0700 In: 960 [P.O.:960] Out: 1575 [Urine:1575] Intake/Output this shift: Total I/O In: 360 [P.O.:360] Out: 600 [Urine:600]  No results found for this basename: HGB,  in the last 72 hours No results found for this basename: WBC, RBC, HCT, PLT,  in the last 72 hours No results found for this basename: NA, K, CL, CO2, BUN, CREATININE, GLUCOSE, CALCIUM,  in the last 72 hours No results found for this basename: LABPT, INR,  in the last 72 hours  Neurovascular intact Dorsiflexion/Plantar flexion intact Incision: no drainage  Assessment/Plan: 3 Days Post-Op Procedure(s) (LRB): IRRIGATION AND DEBRIDEMENT HIP (Left) Discharge to SNF today Continue PT/OT No abx needed.   Dressing change daily.  Wound to stay dry and clean. OV 1 week for wound check  Quintessa Simmerman M 08/19/2012, 10:54 AM

## 2012-08-21 LAB — ANAEROBIC CULTURE

## 2012-10-14 ENCOUNTER — Encounter (HOSPITAL_COMMUNITY): Payer: Self-pay | Admitting: Emergency Medicine

## 2012-10-14 ENCOUNTER — Emergency Department (HOSPITAL_COMMUNITY)
Admission: EM | Admit: 2012-10-14 | Discharge: 2012-10-14 | Disposition: A | Discharge status: Home / Self Care | Payer: Medicare Other | Attending: Emergency Medicine | Admitting: Emergency Medicine

## 2012-10-14 DIAGNOSIS — Z87891 Personal history of nicotine dependence: Secondary | ICD-10-CM | POA: Insufficient documentation

## 2012-10-14 DIAGNOSIS — E782 Mixed hyperlipidemia: Secondary | ICD-10-CM | POA: Insufficient documentation

## 2012-10-14 DIAGNOSIS — Z8719 Personal history of other diseases of the digestive system: Secondary | ICD-10-CM | POA: Insufficient documentation

## 2012-10-14 DIAGNOSIS — Z8709 Personal history of other diseases of the respiratory system: Secondary | ICD-10-CM | POA: Insufficient documentation

## 2012-10-14 DIAGNOSIS — Z9889 Other specified postprocedural states: Secondary | ICD-10-CM | POA: Insufficient documentation

## 2012-10-14 DIAGNOSIS — E162 Hypoglycemia, unspecified: Secondary | ICD-10-CM

## 2012-10-14 DIAGNOSIS — Z87448 Personal history of other diseases of urinary system: Secondary | ICD-10-CM | POA: Insufficient documentation

## 2012-10-14 DIAGNOSIS — E1169 Type 2 diabetes mellitus with other specified complication: Secondary | ICD-10-CM | POA: Insufficient documentation

## 2012-10-14 DIAGNOSIS — Z951 Presence of aortocoronary bypass graft: Secondary | ICD-10-CM | POA: Insufficient documentation

## 2012-10-14 DIAGNOSIS — I509 Heart failure, unspecified: Secondary | ICD-10-CM | POA: Insufficient documentation

## 2012-10-14 DIAGNOSIS — Z79899 Other long term (current) drug therapy: Secondary | ICD-10-CM | POA: Insufficient documentation

## 2012-10-14 DIAGNOSIS — M129 Arthropathy, unspecified: Secondary | ICD-10-CM | POA: Insufficient documentation

## 2012-10-14 DIAGNOSIS — Z7982 Long term (current) use of aspirin: Secondary | ICD-10-CM | POA: Insufficient documentation

## 2012-10-14 DIAGNOSIS — I2510 Atherosclerotic heart disease of native coronary artery without angina pectoris: Secondary | ICD-10-CM | POA: Insufficient documentation

## 2012-10-14 DIAGNOSIS — I10 Essential (primary) hypertension: Secondary | ICD-10-CM | POA: Insufficient documentation

## 2012-10-14 LAB — POCT I-STAT, CHEM 8
Chloride: 98 mEq/L (ref 96–112)
Creatinine, Ser: 0.8 mg/dL (ref 0.50–1.35)
Glucose, Bld: 61 mg/dL — ABNORMAL LOW (ref 70–99)
Hemoglobin: 8.8 g/dL — ABNORMAL LOW (ref 13.0–17.0)
Potassium: 4.2 mEq/L (ref 3.5–5.1)
Sodium: 135 mEq/L (ref 135–145)

## 2012-10-14 MED ORDER — METFORMIN HCL 500 MG PO TABS: 500.0000 mg | ORAL_TABLET | Freq: Two times a day (BID) | ORAL | Status: DC

## 2012-10-14 NOTE — ED Notes (Addendum)
Had long discussion with family discussing pt condition and thoughts on the possibility of pt's recent weight loss causing decreased insulin resistance.  MD made aware

## 2012-10-14 NOTE — ED Notes (Signed)
Pt eating dinner

## 2012-10-14 NOTE — ED Notes (Signed)
Checked patients CBG read 100

## 2012-10-14 NOTE — ED Notes (Signed)
Patient requested a pillow to raise his right hip up as he had moderate pain in his hips. He reports that the pain has eased off due to the repositioning.

## 2012-10-14 NOTE — ED Notes (Signed)
DAUGHTER RENEE Malatesta [phone]

## 2012-10-14 NOTE — ED Notes (Signed)
Pt had slurred speech and diaphoretic. On EMS arrival pt CBG 44. Pt was given a sandwich and CBG in route to ED 77. On ED arrival CBG 100. Pt takes po diabetic meds.

## 2012-10-14 NOTE — ED Provider Notes (Signed)
History     CSN: [id number]  Arrival date & time 10/14/12  1749   First MD Initiated Contact with Patient 10/14/12 1750      Chief Complaint  Patient presents with  . Hypoglycemia     HPI Patient developed slurred speech and diaphoresis and on EMS arrival the patient's blood sugar was 44.  He was given a sandwich at around CBG improved to 77 as did his slurred speech and diaphoresis.  On arrival the emergency room his blood sugars 100 and he feels much better.  He denies weakness of his upper lower extremities.  No difficulty with speech now.  No headache.  No nausea or vomiting.  He does report decreased oral intake over the past several months to a year.  Family reports she continues to not eat and drink quite like he should.  He has lost weight.  He has followup with his primary care physician the last 2 weeks regarding this and there is no clear etiology found at this point.  The patient is on 5 mg of glyburide twice a day and 500 mg of metformin twice a day.  No fevers or chills.  No urinary complaints.   Past Medical History  Diagnosis Date  . Mixed hyperlipidemia   . Benign essential HTN   . CAD (coronary artery disease)     a. CABG 1983. b. Re-do CABG 1997. c. Grafts patent 2003.  Marland Kitchen Hyperlipidemia   . Skin rash     of uncertain etiology  . Prostate enlargement     "epididymysis"  . CHF (congestive heart failure)   . Type II diabetes mellitus     controlled  . Shortness of breath     "before first heart surgery" (08/16/2012)  . GERD (gastroesophageal reflux disease)   . Hepatitis     "[employer] quit taking my blood cause they say I had this" (08/16/2012)  . Arthritis     "some, in my calves/legs I guess"  (08/16/2012)    Past Surgical History  Procedure Laterality Date  . Hydrocele excision Left   . Cardiac catheterization      "I think I've had 4" (08/16/2012)  . Compression hip screw Left 07/30/2012    Procedure: Left Trochanteric Compression Screw (Long);  Surgeon: [name], MD;  Location: [facility] OR;  Service: Orthopedics;  Laterality: Left;  . Tonsillectomy  1939  . Hernia repair Right 2012  . Coronary artery bypass graft  1983; ?1996    CABG X4; CABG X1  . Cataract extraction w/ intraocular lens  implant, bilateral Bilateral ~ 2000  . Incision and drainage hip Left 08/16/2012    hematoma (08/16/2012)  . Incision and drainage hip Left 08/16/2012    Procedure: IRRIGATION AND DEBRIDEMENT HIP;  Surgeon: [name], MD;  Location: [facility] OR;  Service: Orthopedics;  Laterality: Left;  Irrigation and Debridement Left Hip    No family history on file.  History  Substance Use Topics  . Smoking status: Former Smoker -- 1.00 packs/day for 28 years    Types: Cigarettes    Quit date: 05/16/1967  . Smokeless tobacco: Former Neurosurgeon    Types: Chew    Quit date: 05/15/2009  . Alcohol Use: Yes     Comment: 08/16/2012 "once/year I get a gallon of homemade wine"      Review of Systems  All other systems reviewed and are negative.    Allergies  Atorvastatin; Pravastatin sodium; and Rosuvastatin  Home Medications  Current Outpatient Rx  Name  Route  Sig  Dispense  Refill  . alfuzosin (UROXATRAL) 10 MG 24 hr tablet   Oral   Take 10 mg by mouth daily.           Marland Kitchen aspirin EC 325 MG tablet   Oral   Take 325 mg by mouth daily.           . benazepril (LOTENSIN) 10 MG tablet   Oral   Take 10 mg by mouth daily.         Marland Kitchen docusate sodium 100 MG CAPS   Oral   Take 100 mg by mouth 2 (two) times daily.   10 capsule      . ergocalciferol (VITAMIN D2) 50000 UNITS capsule   Oral   Take 50,000 Units by mouth every 14 (fourteen) days. 1st and 15th of each month         . ezetimibe-simvastatin (VYTORIN) 10-10 MG per tablet   Oral   Take 1 tablet by mouth at bedtime.   30 tablet   0   . finasteride (PROSCAR) 5 MG tablet   Oral   Take 5 mg by mouth daily.           Marland Kitchen glyBURIDE-metformin (GLUCOVANCE) 5-500 MG per tablet   Oral   Take 2 tablets by  mouth 2 (two) times daily with a meal.          . methocarbamol (ROBAXIN) 500 MG tablet   Oral   Take 500 mg by mouth at bedtime as needed (for muscle spasms/sleep).          . metoprolol succinate (TOPROL-XL) 50 MG 24 hr tablet   Oral   Take 50 mg by mouth daily. Take with or immediately following a meal.         . traMADol (ULTRAM) 50 MG tablet   Oral   Take 50 mg by mouth daily as needed for pain.           BP 102/60  Pulse 85  Temp(Src) 98 F (36.7 C) (Oral)  Resp 21  SpO2 98%  Physical Exam  Nursing note and vitals reviewed. Constitutional: He is oriented to person, place, and time. He appears well-developed and well-nourished.  HENT:  Head: Normocephalic and atraumatic.  Eyes: EOM are normal.  Neck: Normal range of motion.  Cardiovascular: Normal rate, regular rhythm, normal heart sounds and intact distal pulses.   Pulmonary/Chest: Effort normal and breath sounds normal. No respiratory distress.  Abdominal: Soft. He exhibits no distension. There is no tenderness.  Genitourinary: Rectum normal.  Musculoskeletal: Normal range of motion.  Neurological: He is alert and oriented to person, place, and time.  Skin: Skin is warm and dry.  Psychiatric: He has a normal mood and affect. Judgment normal.    ED Course  Procedures (including critical care time)  Labs Reviewed  POCT I-STAT, CHEM 8 - Abnormal; Notable for the following:    Glucose, Bld 61 (*)    Hemoglobin 8.8 (*)    HCT 26.0 (*)    All other components within normal limits  GLUCOSE, CAPILLARY   No results found.   No diagnosis found.    MDM  Patient maintained his blood sugar in the emergency department.  He was fed.  I will change his oral glycemic medications 2 chest metformin 500 mg by mouth twice a day by itself.  Glyburide will be discontinued.  Close PCP followup.  The patient was instructed to  check his blood sugars twice daily and follow up with his doctor with a glucose log.  Patient  and family understand the importance of close followup and understands return to the ER for new or worsening symptoms.  Renal function normal.  The majority this is likely secondary to decreased oral intake.  I tried to encourage ongoing oral intake.        Lyanne Co, MD 10/14/12 2158

## 2012-10-15 LAB — GLUCOSE, CAPILLARY
Glucose-Capillary: 100 mg/dL — ABNORMAL HIGH (ref 70–99)
Glucose-Capillary: 85 mg/dL (ref 70–99)
Glucose-Capillary: 86 mg/dL (ref 70–99)

## 2012-12-11 ENCOUNTER — Inpatient Hospital Stay (HOSPITAL_COMMUNITY)
Admission: AD | Admit: 2012-12-11 | Discharge: 2012-12-17 | DRG: 485 | Disposition: A | Discharge status: Skilled Nursing Facility | Payer: Medicare Other | Source: Ambulatory Visit | Attending: Orthopaedic Surgery | Admitting: Orthopaedic Surgery

## 2012-12-11 ENCOUNTER — Inpatient Hospital Stay (HOSPITAL_COMMUNITY): Payer: Medicare Other | Admitting: Certified Registered Nurse Anesthetist

## 2012-12-11 ENCOUNTER — Encounter (HOSPITAL_COMMUNITY): Payer: Self-pay | Admitting: Certified Registered Nurse Anesthetist

## 2012-12-11 ENCOUNTER — Encounter (HOSPITAL_COMMUNITY): Payer: Self-pay | Admitting: Anesthesiology

## 2012-12-11 ENCOUNTER — Encounter (HOSPITAL_COMMUNITY)
Admission: AD | Disposition: A | Discharge status: Skilled Nursing Facility | Payer: Self-pay | Source: Ambulatory Visit | Attending: Orthopaedic Surgery

## 2012-12-11 DIAGNOSIS — K219 Gastro-esophageal reflux disease without esophagitis: Secondary | ICD-10-CM | POA: Diagnosis present

## 2012-12-11 DIAGNOSIS — B965 Pseudomonas (aeruginosa) (mallei) (pseudomallei) as the cause of diseases classified elsewhere: Secondary | ICD-10-CM | POA: Diagnosis present

## 2012-12-11 DIAGNOSIS — IMO0002 Reserved for concepts with insufficient information to code with codable children: Secondary | ICD-10-CM

## 2012-12-11 DIAGNOSIS — E119 Type 2 diabetes mellitus without complications: Secondary | ICD-10-CM | POA: Diagnosis present

## 2012-12-11 DIAGNOSIS — E43 Unspecified severe protein-calorie malnutrition: Secondary | ICD-10-CM | POA: Diagnosis present

## 2012-12-11 DIAGNOSIS — I82409 Acute embolism and thrombosis of unspecified deep veins of unspecified lower extremity: Secondary | ICD-10-CM | POA: Diagnosis present

## 2012-12-11 DIAGNOSIS — M00869 Arthritis due to other bacteria, unspecified knee: Secondary | ICD-10-CM | POA: Diagnosis present

## 2012-12-11 DIAGNOSIS — E785 Hyperlipidemia, unspecified: Secondary | ICD-10-CM | POA: Diagnosis present

## 2012-12-11 DIAGNOSIS — M009 Pyogenic arthritis, unspecified: Principal | ICD-10-CM | POA: Diagnosis present

## 2012-12-11 DIAGNOSIS — I82403 Acute embolism and thrombosis of unspecified deep veins of lower extremity, bilateral: Secondary | ICD-10-CM

## 2012-12-11 DIAGNOSIS — Z87891 Personal history of nicotine dependence: Secondary | ICD-10-CM

## 2012-12-11 DIAGNOSIS — I2510 Atherosclerotic heart disease of native coronary artery without angina pectoris: Secondary | ICD-10-CM | POA: Diagnosis present

## 2012-12-11 DIAGNOSIS — R21 Rash and other nonspecific skin eruption: Secondary | ICD-10-CM | POA: Diagnosis not present

## 2012-12-11 DIAGNOSIS — N390 Urinary tract infection, site not specified: Secondary | ICD-10-CM | POA: Diagnosis present

## 2012-12-11 DIAGNOSIS — R630 Anorexia: Secondary | ICD-10-CM | POA: Diagnosis present

## 2012-12-11 DIAGNOSIS — R5381 Other malaise: Secondary | ICD-10-CM | POA: Diagnosis present

## 2012-12-11 DIAGNOSIS — K759 Inflammatory liver disease, unspecified: Secondary | ICD-10-CM | POA: Diagnosis present

## 2012-12-11 DIAGNOSIS — Z7982 Long term (current) use of aspirin: Secondary | ICD-10-CM

## 2012-12-11 DIAGNOSIS — B954 Other streptococcus as the cause of diseases classified elsewhere: Secondary | ICD-10-CM | POA: Diagnosis present

## 2012-12-11 DIAGNOSIS — I5023 Acute on chronic systolic (congestive) heart failure: Secondary | ICD-10-CM | POA: Diagnosis present

## 2012-12-11 DIAGNOSIS — Z951 Presence of aortocoronary bypass graft: Secondary | ICD-10-CM

## 2012-12-11 DIAGNOSIS — I509 Heart failure, unspecified: Secondary | ICD-10-CM | POA: Diagnosis present

## 2012-12-11 DIAGNOSIS — I10 Essential (primary) hypertension: Secondary | ICD-10-CM | POA: Diagnosis present

## 2012-12-11 DIAGNOSIS — Z79899 Other long term (current) drug therapy: Secondary | ICD-10-CM

## 2012-12-11 DIAGNOSIS — I5022 Chronic systolic (congestive) heart failure: Secondary | ICD-10-CM

## 2012-12-11 DIAGNOSIS — E782 Mixed hyperlipidemia: Secondary | ICD-10-CM | POA: Diagnosis present

## 2012-12-11 HISTORY — PX: I&D EXTREMITY: SHX5045

## 2012-12-11 HISTORY — PX: SYNOVECTOMY: SHX5180

## 2012-12-11 HISTORY — PX: KNEE ARTHROSCOPY: SHX127

## 2012-12-11 LAB — CBC WITH DIFFERENTIAL/PLATELET
Basophils Relative: 0 % (ref 0–1)
Eosinophils Absolute: 0 10*3/uL (ref 0.0–0.7)
Eosinophils Relative: 0 % (ref 0–5)
HCT: 28.2 % — ABNORMAL LOW (ref 39.0–52.0)
Hemoglobin: 9.4 g/dL — ABNORMAL LOW (ref 13.0–17.0)
MCH: 25 pg — ABNORMAL LOW (ref 26.0–34.0)
MCHC: 33.3 g/dL (ref 30.0–36.0)
Monocytes Absolute: 0.5 10*3/uL (ref 0.1–1.0)
Monocytes Relative: 4 % (ref 3–12)

## 2012-12-11 LAB — COMPREHENSIVE METABOLIC PANEL
ALT: 14 U/L (ref 0–53)
AST: 12 U/L (ref 0–37)
CO2: 26 mEq/L (ref 19–32)
Calcium: 8.1 mg/dL — ABNORMAL LOW (ref 8.4–10.5)
Chloride: 91 mEq/L — ABNORMAL LOW (ref 96–112)
GFR calc non Af Amer: 78 mL/min — ABNORMAL LOW (ref >90)
Potassium: 3.7 mEq/L (ref 3.5–5.1)
Total Protein: 6 g/dL (ref 6.0–8.3)

## 2012-12-11 LAB — GLUCOSE, CAPILLARY
Glucose-Capillary: 60 mg/dL — ABNORMAL LOW (ref 70–99)
Glucose-Capillary: 75 mg/dL (ref 70–99)
Glucose-Capillary: 95 mg/dL (ref 70–99)

## 2012-12-11 SURGERY — IRRIGATION AND DEBRIDEMENT EXTREMITY
Anesthesia: General | Site: Knee | Laterality: Left | Wound class: Dirty or Infected

## 2012-12-11 MED ORDER — INSULIN ASPART 100 UNIT/ML ~~LOC~~ SOLN
0.0000 [IU] | Freq: Three times a day (TID) | SUBCUTANEOUS | Status: DC
  Administered 2012-12-12: 1 [IU] via SUBCUTANEOUS
  Administered 2012-12-12: 2 [IU] via SUBCUTANEOUS
  Administered 2012-12-13: 3 [IU] via SUBCUTANEOUS
  Administered 2012-12-14: 1 [IU] via SUBCUTANEOUS
  Administered 2012-12-14: 3 [IU] via SUBCUTANEOUS
  Administered 2012-12-14: 1 [IU] via SUBCUTANEOUS
  Administered 2012-12-15: 3 [IU] via SUBCUTANEOUS
  Administered 2012-12-15: 2 [IU] via SUBCUTANEOUS
  Administered 2012-12-16: 3 [IU] via SUBCUTANEOUS

## 2012-12-11 MED ORDER — MORPHINE SULFATE 2 MG/ML IJ SOLN
2.0000 mg | INTRAMUSCULAR | Status: DC | PRN
  Administered 2012-12-11: 2 mg via INTRAVENOUS
  Filled 2012-12-11: qty 1

## 2012-12-11 MED ORDER — OXYCODONE HCL 5 MG PO TABS: 5.0000 mg | ORAL_TABLET | Freq: Once | ORAL | Status: AC | PRN

## 2012-12-11 MED ORDER — ENOXAPARIN SODIUM 40 MG/0.4ML ~~LOC~~ SOLN
40.0000 mg | SUBCUTANEOUS | Status: DC
  Administered 2012-12-11: 40 mg via SUBCUTANEOUS
  Filled 2012-12-11 (×2): qty 0.4

## 2012-12-11 MED ORDER — METHOCARBAMOL 100 MG/ML IJ SOLN
500.0000 mg | Freq: Four times a day (QID) | INTRAVENOUS | Status: DC | PRN
  Filled 2012-12-11: qty 5

## 2012-12-11 MED ORDER — SODIUM CHLORIDE 0.9 % IV SOLN
3.0000 g | Freq: Four times a day (QID) | INTRAVENOUS | Status: DC
  Administered 2012-12-11 – 2012-12-14 (×10): 3 g via INTRAVENOUS
  Filled 2012-12-11 (×14): qty 3

## 2012-12-11 MED ORDER — PHENYLEPHRINE HCL 10 MG/ML IJ SOLN
INTRAMUSCULAR | Status: DC | PRN
  Administered 2012-12-11 (×3): 80 ug via INTRAVENOUS

## 2012-12-11 MED ORDER — LACTATED RINGERS IV SOLN
INTRAVENOUS | Status: DC
  Administered 2012-12-11: 17:00:00 via INTRAVENOUS

## 2012-12-11 MED ORDER — ONDANSETRON HCL 4 MG PO TABS: 4.0000 mg | ORAL_TABLET | Freq: Four times a day (QID) | ORAL | Status: DC | PRN

## 2012-12-11 MED ORDER — FENTANYL CITRATE 0.05 MG/ML IJ SOLN: 25.0000 ug | INTRAMUSCULAR | Status: DC | PRN

## 2012-12-11 MED ORDER — ALBUMIN HUMAN 5 % IV SOLN
INTRAVENOUS | Status: DC | PRN
  Administered 2012-12-11 (×2): via INTRAVENOUS

## 2012-12-11 MED ORDER — EZETIMIBE 10 MG PO TABS
10.0000 mg | ORAL_TABLET | Freq: Every evening | ORAL | Status: DC
  Administered 2012-12-12 – 2012-12-16 (×5): 10 mg via ORAL
  Filled 2012-12-11 (×7): qty 1

## 2012-12-11 MED ORDER — MORPHINE SULFATE 2 MG/ML IJ SOLN
INTRAMUSCULAR | Status: AC
  Filled 2012-12-11: qty 1

## 2012-12-11 MED ORDER — FLEET ENEMA 7-19 GM/118ML RE ENEM: 1.0000 | ENEMA | Freq: Once | RECTAL | Status: AC | PRN

## 2012-12-11 MED ORDER — METOCLOPRAMIDE HCL 5 MG/ML IJ SOLN: 5.0000 mg | Freq: Three times a day (TID) | INTRAMUSCULAR | Status: DC | PRN

## 2012-12-11 MED ORDER — OXYCODONE-ACETAMINOPHEN 5-325 MG PO TABS
1.0000 | ORAL_TABLET | ORAL | Status: DC | PRN
  Administered 2012-12-14: 1 via ORAL
  Filled 2012-12-11: qty 1

## 2012-12-11 MED ORDER — FENTANYL CITRATE 0.05 MG/ML IJ SOLN
INTRAMUSCULAR | Status: DC | PRN
  Administered 2012-12-11: 50 ug via INTRAVENOUS
  Administered 2012-12-11: 25 ug via INTRAVENOUS
  Administered 2012-12-11: 75 ug via INTRAVENOUS

## 2012-12-11 MED ORDER — SUCCINYLCHOLINE CHLORIDE 20 MG/ML IJ SOLN
INTRAMUSCULAR | Status: DC | PRN
  Administered 2012-12-11: 100 mg via INTRAVENOUS

## 2012-12-11 MED ORDER — ACETAMINOPHEN 650 MG RE SUPP: 650.0000 mg | Freq: Four times a day (QID) | RECTAL | Status: DC | PRN

## 2012-12-11 MED ORDER — METHOCARBAMOL 500 MG PO TABS
500.0000 mg | ORAL_TABLET | Freq: Four times a day (QID) | ORAL | Status: DC | PRN
  Administered 2012-12-11 – 2012-12-17 (×9): 500 mg via ORAL
  Filled 2012-12-11 (×9): qty 1

## 2012-12-11 MED ORDER — ONDANSETRON HCL 4 MG/2ML IJ SOLN: 4.0000 mg | Freq: Four times a day (QID) | INTRAMUSCULAR | Status: DC | PRN

## 2012-12-11 MED ORDER — FUROSEMIDE 10 MG/ML IJ SOLN
40.0000 mg | Freq: Every day | INTRAMUSCULAR | Status: DC
  Administered 2012-12-12: 40 mg via INTRAVENOUS
  Filled 2012-12-11 (×2): qty 4

## 2012-12-11 MED ORDER — LIDOCAINE HCL (CARDIAC) 20 MG/ML IV SOLN
INTRAVENOUS | Status: DC | PRN
  Administered 2012-12-11: 70 mg via INTRAVENOUS

## 2012-12-11 MED ORDER — POTASSIUM CHLORIDE IN NACL 20-0.45 MEQ/L-% IV SOLN
INTRAVENOUS | Status: DC
  Administered 2012-12-12: 75 mL/h via INTRAVENOUS
  Administered 2012-12-12: 21:00:00 via INTRAVENOUS
  Filled 2012-12-11 (×4): qty 1000

## 2012-12-11 MED ORDER — FENTANYL CITRATE 0.05 MG/ML IJ SOLN
25.0000 ug | INTRAMUSCULAR | Status: DC | PRN
  Administered 2012-12-11 (×3): 25 ug via INTRAVENOUS

## 2012-12-11 MED ORDER — VANCOMYCIN HCL 10 G IV SOLR
1500.0000 mg | INTRAVENOUS | Status: DC
  Filled 2012-12-11 (×2): qty 1500

## 2012-12-11 MED ORDER — BENAZEPRIL HCL 10 MG PO TABS
10.0000 mg | ORAL_TABLET | Freq: Every day | ORAL | Status: DC
  Administered 2012-12-12: 10 mg via ORAL
  Filled 2012-12-11 (×2): qty 1

## 2012-12-11 MED ORDER — DIPHENHYDRAMINE HCL 12.5 MG/5ML PO ELIX: 12.5000 mg | ORAL_SOLUTION | ORAL | Status: DC | PRN

## 2012-12-11 MED ORDER — BISACODYL 10 MG RE SUPP: 10.0000 mg | Freq: Every day | RECTAL | Status: DC | PRN

## 2012-12-11 MED ORDER — LACTATED RINGERS IV SOLN
INTRAVENOUS | Status: DC | PRN
  Administered 2012-12-11: 18:00:00 via INTRAVENOUS

## 2012-12-11 MED ORDER — ONDANSETRON HCL 4 MG/2ML IJ SOLN
INTRAMUSCULAR | Status: DC | PRN
  Administered 2012-12-11: 4 mg via INTRAVENOUS

## 2012-12-11 MED ORDER — ACETAMINOPHEN 325 MG PO TABS
ORAL_TABLET | ORAL | Status: AC
  Filled 2012-12-11: qty 2

## 2012-12-11 MED ORDER — METOPROLOL SUCCINATE ER 50 MG PO TB24
50.0000 mg | ORAL_TABLET | Freq: Every day | ORAL | Status: DC
  Administered 2012-12-13 – 2012-12-16 (×4): 50 mg via ORAL
  Filled 2012-12-11 (×7): qty 1

## 2012-12-11 MED ORDER — ALFUZOSIN HCL ER 10 MG PO TB24
10.0000 mg | ORAL_TABLET | Freq: Every day | ORAL | Status: DC
  Administered 2012-12-12 – 2012-12-17 (×6): 10 mg via ORAL
  Filled 2012-12-11 (×7): qty 1

## 2012-12-11 MED ORDER — FENTANYL CITRATE 0.05 MG/ML IJ SOLN
INTRAMUSCULAR | Status: AC
  Filled 2012-12-11: qty 2

## 2012-12-11 MED ORDER — DOCUSATE SODIUM 100 MG PO CAPS
100.0000 mg | ORAL_CAPSULE | Freq: Two times a day (BID) | ORAL | Status: DC
  Administered 2012-12-11 – 2012-12-17 (×12): 100 mg via ORAL
  Filled 2012-12-11 (×12): qty 1

## 2012-12-11 MED ORDER — EPHEDRINE SULFATE 50 MG/ML IJ SOLN
INTRAMUSCULAR | Status: DC | PRN
  Administered 2012-12-11 (×2): 10 mg via INTRAVENOUS

## 2012-12-11 MED ORDER — EZETIMIBE-SIMVASTATIN 10-10 MG PO TABS
1.0000 | ORAL_TABLET | Freq: Every day | ORAL | Status: DC
  Filled 2012-12-11: qty 1

## 2012-12-11 MED ORDER — FINASTERIDE 5 MG PO TABS
5.0000 mg | ORAL_TABLET | Freq: Every day | ORAL | Status: DC
  Administered 2012-12-12 – 2012-12-17 (×6): 5 mg via ORAL
  Filled 2012-12-11 (×7): qty 1

## 2012-12-11 MED ORDER — PROMETHAZINE HCL 25 MG/ML IJ SOLN: 6.2500 mg | INTRAMUSCULAR | Status: DC | PRN

## 2012-12-11 MED ORDER — OXYCODONE HCL 5 MG/5ML PO SOLN: 5.0000 mg | Freq: Once | ORAL | Status: AC | PRN

## 2012-12-11 MED ORDER — METOCLOPRAMIDE HCL 10 MG PO TABS: 5.0000 mg | ORAL_TABLET | Freq: Three times a day (TID) | ORAL | Status: DC | PRN

## 2012-12-11 MED ORDER — ACETAMINOPHEN 325 MG PO TABS
650.0000 mg | ORAL_TABLET | Freq: Four times a day (QID) | ORAL | Status: DC | PRN
  Administered 2012-12-11 – 2012-12-12 (×3): 650 mg via ORAL
  Filled 2012-12-11 (×2): qty 2

## 2012-12-11 MED ORDER — SENNOSIDES-DOCUSATE SODIUM 8.6-50 MG PO TABS: 1.0000 | ORAL_TABLET | Freq: Every evening | ORAL | Status: DC | PRN

## 2012-12-11 MED ORDER — HYDROCODONE-ACETAMINOPHEN 5-325 MG PO TABS
1.0000 | ORAL_TABLET | ORAL | Status: DC | PRN
  Administered 2012-12-11 – 2012-12-17 (×13): 2 via ORAL
  Filled 2012-12-11 (×13): qty 2

## 2012-12-11 MED ORDER — MORPHINE SULFATE 2 MG/ML IJ SOLN
1.0000 mg | INTRAMUSCULAR | Status: DC | PRN
  Administered 2012-12-11 – 2012-12-13 (×5): 1 mg via INTRAVENOUS
  Filled 2012-12-11 (×4): qty 1

## 2012-12-11 MED ORDER — PROPOFOL 10 MG/ML IV BOLUS
INTRAVENOUS | Status: DC | PRN
  Administered 2012-12-11: 115 mg via INTRAVENOUS

## 2012-12-11 MED ORDER — SIMVASTATIN 10 MG PO TABS
10.0000 mg | ORAL_TABLET | Freq: Every evening | ORAL | Status: DC
  Administered 2012-12-12 – 2012-12-16 (×5): 10 mg via ORAL
  Filled 2012-12-11 (×7): qty 1

## 2012-12-11 SURGICAL SUPPLY — 41 items
BAG DECANTER FOR FLEXI CONT (MISCELLANEOUS) ×2 IMPLANT
BANDAGE ELASTIC 4 VELCRO ST LF (GAUZE/BANDAGES/DRESSINGS) ×2 IMPLANT
BANDAGE GAUZE ELAST BULKY 4 IN (GAUZE/BANDAGES/DRESSINGS) IMPLANT
BLADE GREAT WHITE 4.2 (BLADE) ×2 IMPLANT
BNDG COHESIVE 4X5 TAN STRL (GAUZE/BANDAGES/DRESSINGS) ×2 IMPLANT
CLOTH BEACON ORANGE TIMEOUT ST (SAFETY) ×2 IMPLANT
COVER SURGICAL LIGHT HANDLE (MISCELLANEOUS) ×2 IMPLANT
CUFF TOURNIQUET SINGLE 18IN (TOURNIQUET CUFF) IMPLANT
DRSG EMULSION OIL 3X3 NADH (GAUZE/BANDAGES/DRESSINGS) IMPLANT
DRSG PAD ABDOMINAL 8X10 ST (GAUZE/BANDAGES/DRESSINGS) ×2 IMPLANT
DRSG TEGADERM 4X4.75 (GAUZE/BANDAGES/DRESSINGS) ×2 IMPLANT
ELECT REM PT RETURN 9FT ADLT (ELECTROSURGICAL)
ELECTRODE REM PT RTRN 9FT ADLT (ELECTROSURGICAL) IMPLANT
GAUZE XEROFORM 5X9 LF (GAUZE/BANDAGES/DRESSINGS) ×2 IMPLANT
GLOVE BIOGEL PI IND STRL 8 (GLOVE) ×1 IMPLANT
GLOVE BIOGEL PI INDICATOR 8 (GLOVE) ×1
GLOVE ORTHO TXT STRL SZ7.5 (GLOVE) ×2 IMPLANT
GOWN PREVENTION PLUS LG XLONG (DISPOSABLE) IMPLANT
GOWN PREVENTION PLUS XLARGE (GOWN DISPOSABLE) ×2 IMPLANT
GOWN STRL NON-REIN LRG LVL3 (GOWN DISPOSABLE) ×4 IMPLANT
HANDPIECE INTERPULSE COAX TIP (DISPOSABLE)
KIT BASIN OR (CUSTOM PROCEDURE TRAY) ×2 IMPLANT
KIT ROOM TURNOVER OR (KITS) ×2 IMPLANT
MANIFOLD NEPTUNE II (INSTRUMENTS) ×2 IMPLANT
NS IRRIG 1000ML POUR BTL (IV SOLUTION) ×2 IMPLANT
PACK ORTHO EXTREMITY (CUSTOM PROCEDURE TRAY) ×2 IMPLANT
PAD ARMBOARD 7.5X6 YLW CONV (MISCELLANEOUS) ×4 IMPLANT
SET HNDPC FAN SPRY TIP SCT (DISPOSABLE) IMPLANT
SPONGE GAUZE 4X4 12PLY (GAUZE/BANDAGES/DRESSINGS) ×2 IMPLANT
SPONGE LAP 18X18 X RAY DECT (DISPOSABLE) IMPLANT
SPONGE LAP 4X18 X RAY DECT (DISPOSABLE) IMPLANT
STOCKINETTE IMPERVIOUS 9X36 MD (GAUZE/BANDAGES/DRESSINGS) ×2 IMPLANT
SUT ETHILON 4 0 PS 2 18 (SUTURE) ×4 IMPLANT
SYR CONTROL 10ML LL (SYRINGE) ×2 IMPLANT
TOWEL OR 17X24 6PK STRL BLUE (TOWEL DISPOSABLE) ×2 IMPLANT
TOWEL OR 17X26 10 PK STRL BLUE (TOWEL DISPOSABLE) ×2 IMPLANT
TUBE ANAEROBIC SPECIMEN COL (MISCELLANEOUS) ×2 IMPLANT
TUBE CONNECTING 12X1/4 (SUCTIONS) ×2 IMPLANT
UNDERPAD 30X30 INCONTINENT (UNDERPADS AND DIAPERS) ×2 IMPLANT
WATER STERILE IRR 1000ML POUR (IV SOLUTION) ×2 IMPLANT
YANKAUER SUCT BULB TIP NO VENT (SUCTIONS) ×2 IMPLANT

## 2012-12-11 NOTE — Interval H&P Note (Signed)
History and Physical Interval Note:  12/11/2012 5:31 PM  Curtis White  has presented today for surgery, with the diagnosis of Left Knee Infection  The various methods of treatment have been discussed with the patient and family. After consideration of risks, benefits and other options for treatment, the patient has consented to  Procedure(s) with comments: IRRIGATION AND DEBRIDEMENT EXTREMITY (Left) - Left Knee Arthroscopic Washout as a surgical intervention .  The patient's history has been reviewed, patient examined, no change in status, stable for surgery.  I have reviewed the patient's chart and labs.  Questions were answered to the patient's satisfaction.     YATES,MARK C

## 2012-12-11 NOTE — Progress Notes (Signed)
ANTIBIOTIC CONSULT NOTE - INITIAL  Pharmacy Consult for Vancomycin Indication: Septic knee  Allergies  Allergen Reactions  . Atorvastatin Other (See Comments)    Muscle weakness   . Pravastatin Sodium Other (See Comments)    Muscle weakness   . Rosuvastatin Other (See Comments)    Muscle weakness     Patient Measurements:   Adjusted Body Weight:    Vital Signs: Temp: 97.8 F (36.6 C) (07/30 1712) BP: 108/68 mmHg (07/30 1712) Pulse Rate: 52 (07/30 1712) Intake/Output from previous day:   Intake/Output from this shift: Total I/O In: 0  Out: 300 [Urine:300]  Labs:  Recent Labs  12/11/12 1419  WBC 12.5*  HGB 9.4*  PLT 261  CREATININE 0.77   The CrCl is unknown because both a height and weight (above a minimum accepted value) are required for this calculation. No results found for this basename: VANCOTROUGH, VANCOPEAK, VANCORANDOM, GENTTROUGH, GENTPEAK, GENTRANDOM, TOBRATROUGH, TOBRAPEAK, TOBRARND, AMIKACINPEAK, AMIKACINTROU, AMIKACIN,  in the last 72 hours   Microbiology: No results found for this or any previous visit (from the past 720 hour(s)).  Medical History: Past Medical History  Diagnosis Date  . Mixed hyperlipidemia   . Benign essential HTN   . CAD (coronary artery disease)     a. CABG 1983. b. Re-do CABG 1997. c. Grafts patent 2003.  Marland Kitchen Hyperlipidemia   . Skin rash     of uncertain etiology  . Prostate enlargement     "epididymysis"  . CHF (congestive heart failure)   . Type II diabetes mellitus     controlled  . Shortness of breath     "before first heart surgery" (08/16/2012)  . GERD (gastroesophageal reflux disease)   . Hepatitis     "[employer] quit taking my blood cause they say I had this" (08/16/2012)  . Arthritis     "some, in my calves/legs I guess"  (08/16/2012)    Medications:  Prescriptions prior to admission  Medication Sig Dispense Refill  . alfuzosin (UROXATRAL) 10 MG 24 hr tablet Take 10 mg by mouth daily.        Marland Kitchen aspirin 81 MG  tablet Take 81 mg by mouth daily.      . benazepril (LOTENSIN) 10 MG tablet Take 10 mg by mouth daily.      Marland Kitchen docusate sodium 100 MG CAPS Take 100 mg by mouth 2 (two) times daily.  10 capsule    . ergocalciferol (VITAMIN D2) 50000 UNITS capsule Take 50,000 Units by mouth every 14 (fourteen) days. 1st and 15th of each month      . ezetimibe-simvastatin (VYTORIN) 10-10 MG per tablet Take 1 tablet by mouth at bedtime.  30 tablet  0  . finasteride (PROSCAR) 5 MG tablet Take 5 mg by mouth daily.        . metFORMIN (GLUCOPHAGE) 500 MG tablet Take 1 tablet (500 mg total) by mouth 2 (two) times daily with a meal.  60 tablet  0  . methocarbamol (ROBAXIN) 500 MG tablet Take 500 mg by mouth at bedtime as needed (for muscle spasms/sleep).       . metoprolol succinate (TOPROL-XL) 50 MG 24 hr tablet Take 50 mg by mouth daily. Take with or immediately following a meal.      . traMADol (ULTRAM) 50 MG tablet Take 50 mg by mouth daily as needed for pain.       Assessment: 79 y/o M with L knee pain x 2 weeks. Had intr-articular joint injection 7/21  for OA pain. Pain has worsened, knee and legs now with worsening edema. L knee with large effusion. Aspirated pus.  PMH: HLD, HTN, CAD with CABG 1983/1997, CHF, DM,GERD, hepatitis, arthritis, BPH  Vitals/Labs: Afebrile. HR 52. WBC 12.5. Scr 0.77 with estimated CrCl 58.  Goal of Therapy:  Vancomycin trough level 10-15 mcg/ml  Plan:  Vancomycin 1500mg  IV q24h. Trough after 3-5 doses at steady state.   Merilynn Finland, [employer] 12/11/2012,5:27 PM

## 2012-12-11 NOTE — Progress Notes (Signed)
Blood sugar recheck 69.  Giving 120cc apple juice.  Will recheck.

## 2012-12-11 NOTE — Consult Note (Signed)
[facility] Medical Consultation  PATIENT DETAILS Name: Curtis White Age: 79 y.o. Sex: male Date of Birth: 02-13-1922 Admit Date: 12/11/2012 [id number] Curtis Millin, MD Requesting MD: [name], MD  Date of consultation: 12/11/12   REASON FOR CONSULTATION:  Management of medical comorbidities  Impression: 1. Left septic arthritis 2. Diabetes 3. Hypertension 4. Bilateral lower extremity edema-of the mild decompensated chronic systolic heart failure 5. BPH  Recommendations 1. Would also cover with a vancomycin, continue with Unasyn for now, please obtain intraoperative wound cultures from synovial fluid 2. Place on sliding scale insulin for diabetes- monitor CBGs and adjust accordingly 3. Start Lasix 40 mg IV daily for peripheral lower extremity edema. 4. Has more swelling on the left lower extremity compared to the right, we'll check a lower extremity Dopplers. 5. Would resume metoprolol and benazepril.  [facility] will followup again tomorrow. Please contact me if I can be of assistance in the meanwhile. Thank you for this consultation.  [name] Pager [phone]  HPI: The patient is a very pleasant 79 year old Caucasian male who recently broke his left hip and required left trochanteric compression screw placement, unfortunately his postoperative course was complicated by a hematoma that needed to be drained presents to the hospital with worsening swelling and erythema of his left knee. Recently, as an outpatient, patient required an intra-articular? Steroid injection. Patient also has a history of diabetes, hypertension, chronic systolic heart failure, BPH. He was seen in the orthopedic clinic today, where diagnostic arthrocentesis revealed frank pus, patient was then admitted to the orthopedic service for incision and drainage of left knee, the hospitalist service was consulted for management of the patient's  multiple medical problems. There is no history of chest pain, shortness of breath, nausea vomiting or diarrhea. Patient was recently seen by cardiology approximately 3 months ago, cleared for left hip repair.  ALLERGIES:   Allergies  Allergen Reactions  . Atorvastatin Other (See Comments)    Muscle weakness   . Pravastatin Sodium Other (See Comments)    Muscle weakness   . Rosuvastatin Other (See Comments)    Muscle weakness     PAST MEDICAL HISTORY: Past Medical History  Diagnosis Date  . Mixed hyperlipidemia   . Benign essential HTN   . CAD (coronary artery disease)     a. CABG 1983. b. Re-do CABG 1997. c. Grafts patent 2003.  Marland Kitchen Hyperlipidemia   . Skin rash     of uncertain etiology  . Prostate enlargement     "epididymysis"  . CHF (congestive heart failure)   . Type II diabetes mellitus     controlled  . Shortness of breath     "before first heart surgery" (08/16/2012)  . GERD (gastroesophageal reflux disease)   . Hepatitis     "[employer] quit taking my blood cause they say I had this" (08/16/2012)  . Arthritis     "some, in my calves/legs I guess"  (08/16/2012)    PAST SURGICAL HISTORY: Past Surgical History  Procedure Laterality Date  . Hydrocele excision Left   . Cardiac catheterization      "I think I've had 4" (08/16/2012)  . Compression hip screw Left 07/30/2012    Procedure: Left Trochanteric Compression Screw (Long);  Surgeon: [name], MD;  Location: [facility] OR;  Service: Orthopedics;  Laterality: Left;  . Tonsillectomy  1939  . Hernia repair Right 2012  . Coronary artery bypass graft  1983; ?1996  CABG X4; CABG X1  . Cataract extraction w/ intraocular lens  implant, bilateral Bilateral ~ 2000  . Incision and drainage hip Left 08/16/2012    hematoma (08/16/2012)  . Incision and drainage hip Left 08/16/2012    Procedure: IRRIGATION AND DEBRIDEMENT HIP;  Surgeon: [name], MD;  Location: [facility] OR;  Service: Orthopedics;  Laterality: Left;  Irrigation and  Debridement Left Hip    MEDICATIONS AT HOME: Prior to Admission medications   Medication Sig Start Date End Date Taking? Authorizing Provider  alfuzosin (UROXATRAL) 10 MG 24 hr tablet Take 10 mg by mouth daily.     Yes Historical Provider, MD  aspirin 81 MG tablet Take 81 mg by mouth daily.   Yes Historical Provider, MD  benazepril (LOTENSIN) 10 MG tablet Take 10 mg by mouth daily.   Yes Historical Provider, MD  docusate sodium 100 MG CAPS Take 100 mg by mouth 2 (two) times daily. 08/01/12  Yes Ripudeep Jenna Luo, MD  ergocalciferol (VITAMIN D2) 50000 UNITS capsule Take 50,000 Units by mouth every 14 (fourteen) days. 1st and 15th of each month   Yes Historical Provider, MD  ezetimibe-simvastatin (VYTORIN) 10-10 MG per tablet Take 1 tablet by mouth at bedtime. 04/15/12  Yes Wendall Stade, MD  finasteride (PROSCAR) 5 MG tablet Take 5 mg by mouth daily.     Yes Historical Provider, MD  metFORMIN (GLUCOPHAGE) 500 MG tablet Take 1 tablet (500 mg total) by mouth 2 (two) times daily with a meal. 10/14/12  Yes Lyanne Co, MD  methocarbamol (ROBAXIN) 500 MG tablet Take 500 mg by mouth at bedtime as needed (for muscle spasms/sleep).    Yes Historical Provider, MD  metoprolol succinate (TOPROL-XL) 50 MG 24 hr tablet Take 50 mg by mouth daily. Take with or immediately following a meal.   Yes Historical Provider, MD  traMADol (ULTRAM) 50 MG tablet Take 50 mg by mouth daily as needed for pain.   Yes Historical Provider, MD    FAMILY HISTORY: No family history on file.  SOCIAL HISTORY:  reports that he quit smoking about 45 years ago. His smoking use included Cigarettes. He has a 28 pack-year smoking history. He quit smokeless tobacco use about 3 years ago. His smokeless tobacco use included Chew. He reports that  drinks alcohol. He reports that he does not use illicit drugs.  REVIEW OF SYSTEMS:  Constitutional:   No  weight loss, night sweats,  Fevers, chills, fatigue.  HEENT:    No headaches,  Difficulty swallowing,Tooth/dental problems,Sore throat,  No sneezing, itching, ear ache, nasal congestion, post nasal drip,   Cardio-vascular: No chest pain,  Orthopnea, PND, swelling in lower extremities, anasarca, dizziness, palpitations  GI:  No heartburn, indigestion, abdominal pain, nausea, vomiting, diarrhea, change in bowel habits, loss of appetite  Resp: No shortness of breath with exertion or at rest.  No excess mucus, no productive cough, No non-productive cough,  No coughing up of blood.No change in color of mucus.No wheezing.No chest wall deformity  Skin:  no rash or lesions.  GU:  no dysuria, change in color of urine, no urgency or frequency.  No flank pain.  Musculoskeletal: No joint pain or swelling.  No decreased range of motion.  No back pain.  Psych: No change in mood or affect. No depression or anxiety.  No memory loss.   PHYSICAL EXAM: Blood pressure 102/66, pulse 109, temperature 97.8 F (36.6 C), resp. rate 18, SpO2 100.00%.  General appearance :Awake, alert, not  in any distress. Speech Clear. Not toxic Looking HEENT: Atraumatic and Normocephalic, pupils equally reactive to light and accomodation Neck: supple, no JVD. No cervical lymphadenopathy.  Chest:Good air entry bilaterally, no added sounds  CVS: S1 S2 regular, no murmurs.  Abdomen: Bowel sounds present, Non tender and not distended with no gaurding, rigidity or rebound. Extremities: B/L Lower Ext shows no edema, both legs are warm to touch, with  dorsalis pedis pulses palpable. Left knee-very swollen with overlying erythema, exquisitely tender. Neurology: Awake alert, and oriented X 3, CN II-XII intact, Non focal Skin:No Rash Wounds:N/A  LABS ON ADMISSION:   Recent Labs  12/11/12 1419  NA 128*  K 3.7  CL 91*  CO2 26  GLUCOSE 105*  BUN 22  CREATININE 0.77  CALCIUM 8.1*    Recent Labs  12/11/12 1419  AST 12  ALT 14  ALKPHOS 93  BILITOT 0.4  PROT 6.0  ALBUMIN 2.1*   No  results found for this basename: LIPASE, AMYLASE,  in the last 72 hours  Recent Labs  12/11/12 1419  WBC 12.5*  NEUTROABS 11.1*  HGB 9.4*  HCT 28.2*  MCV 75.0*  PLT 261   No results found for this basename: CKTOTAL, CKMB, CKMBINDEX, TROPONINI,  in the last 72 hours No results found for this basename: DDIMER,  in the last 72 hours No components found with this basename: POCBNP,    RADIOLOGIC STUDIES ON ADMISSION: No results found.   Total time spent 45 minutes.  [facility] Pager [phone]  If 7PM-7AM, please contact night-coverage [email or web address] Password [facility] 12/11/2012, 3:44 PM

## 2012-12-11 NOTE — Transfer of Care (Signed)
Immediate Anesthesia Transfer of Care Note  Patient: Curtis White  Procedure(s) Performed: Procedure(s) with comments: IRRIGATION AND DEBRIDEMENT EXTREMITY (Left) - Left Knee Arthroscopic Washout ARTHROSCOPY KNEE (Left) SYNOVECTOMY (Left)  Patient Location: PACU  Anesthesia Type:General  Level of Consciousness: awake, alert  and oriented  Airway & Oxygen Therapy: Patient Spontanous Breathing and Patient connected to nasal cannula oxygen  Post-op Assessment: Report given to PACU RN, Post -op Vital signs reviewed and stable and Patient moving all extremities X 4  Post vital signs: Reviewed and stable  Complications: No apparent anesthesia complications

## 2012-12-11 NOTE — Anesthesia Preprocedure Evaluation (Signed)
Anesthesia Evaluation  Patient identified by MRN, date of birth, ID band Patient awake    Reviewed: Allergy & Precautions, H&P , NPO status , Patient's Chart, lab work & pertinent test results, reviewed documented beta blocker date and time   Airway Mallampati: II TM Distance: >3 FB Neck ROM: full    Dental   Pulmonary shortness of breath and with exertion,  breath sounds clear to auscultation        Cardiovascular hypertension, On Medications and On Home Beta Blockers + CAD, + CABG and +CHF negative cardio ROS  Rhythm:regular     Neuro/Psych negative neurological ROS  negative psych ROS   GI/Hepatic negative GI ROS, Neg liver ROS, GERD-  Medicated and Controlled,(+) Hepatitis -  Endo/Other  diabetes, Oral Hypoglycemic Agents  Renal/GU negative Renal ROS  negative genitourinary   Musculoskeletal   Abdominal   Peds  Hematology negative hematology ROS (+)   Anesthesia Other Findings See surgeon's H&P   Reproductive/Obstetrics negative OB ROS                           Anesthesia Physical Anesthesia Plan  ASA: III  Anesthesia Plan: General   Post-op Pain Management:    Induction: Intravenous  Airway Management Planned: Oral ETT  Additional Equipment:   Intra-op Plan:   Post-operative Plan: Extubation in OR  Informed Consent: I have reviewed the patients History and Physical, chart, labs and discussed the procedure including the risks, benefits and alternatives for the proposed anesthesia with the patient or authorized representative who has indicated his/her understanding and acceptance.   Dental Advisory Given  Plan Discussed with: CRNA and Surgeon  Anesthesia Plan Comments:         Anesthesia Quick Evaluation

## 2012-12-11 NOTE — H&P (Signed)
Curtis White is an 79 y.o. male.   Chief Complaint: pain of left knee HPI: Pt presents to office today with app 2 weeks of left knee pain.  He was treated on 12/02/2012 with intra articular joint injection for knee pain related to osteoarthritis.  Symptoms were relieved for short time, but returned this past weekend with increasing severity of pain and swelling. He is escorted by his daughter today and she reports the pt has been sleeping in a recliner as he cannot straighten the left knee.  He has not been eating well and she has been trying to get him to the doctor.  Pt has had increasing edema of the legs over the past few days and obtained a diuretic from his PCP, Dr Jeannetta Nap, yesterday.  He reports some improvement of the edema with the meds.  Has had weeping of the left lower leg and could not use his usual compression stockings. He denies fever or chills.  No SOB or chest pain.  Report he has been urinating and did have increase in urine after diuretic.  Reports his blood sugars have been stable however his daughter indicates possibly not as stable as he reports.  He states he has a poor appetite and is making himself eat. Denies confusion or agitation. Does not recall any injury to the left knee.he is S/P left hip fracture and treatment with compression nail left on 07/2012.   On exam today left knee with large effusion.  Aspiration performed and 5cc of frank pus obtained.  Specimen sent for stat gram stain and C&S, cell count and crystal analysis.  Pt admitted to hospital of arthroscopic I&D of left knee  Past Medical History  Diagnosis Date  . Mixed hyperlipidemia   . Benign essential HTN   . CAD (coronary artery disease)     a. CABG 1983. b. Re-do CABG 1997. c. Grafts patent 2003.  Marland Kitchen Hyperlipidemia   . Skin rash     of uncertain etiology  . Prostate enlargement     "epididymysis"  . CHF (congestive heart failure)   . Type II diabetes mellitus     controlled  . Shortness of breath    "before first heart surgery" (08/16/2012)  . GERD (gastroesophageal reflux disease)   . Hepatitis     "[employer] quit taking my blood cause they say I had this" (08/16/2012)  . Arthritis     "some, in my calves/legs I guess"  (08/16/2012)    Past Surgical History  Procedure Laterality Date  . Hydrocele excision Left   . Cardiac catheterization      "I think I've had 4" (08/16/2012)  . Compression hip screw Left 07/30/2012    Procedure: Left Trochanteric Compression Screw (Long);  Surgeon: [name], MD;  Location: [facility] OR;  Service: Orthopedics;  Laterality: Left;  . Tonsillectomy  1939  . Hernia repair Right 2012  . Coronary artery bypass graft  1983; ?1996    CABG X4; CABG X1  . Cataract extraction w/ intraocular lens  implant, bilateral Bilateral ~ 2000  . Incision and drainage hip Left 08/16/2012    hematoma (08/16/2012)  . Incision and drainage hip Left 08/16/2012    Procedure: IRRIGATION AND DEBRIDEMENT HIP;  Surgeon: [name], MD;  Location: [facility] OR;  Service: Orthopedics;  Laterality: Left;  Irrigation and Debridement Left Hip    No family history on file. Social History:  reports that he quit smoking about 45 years ago. His smoking  use included Cigarettes. He has a 28 pack-year smoking history. He quit smokeless tobacco use about 3 years ago. His smokeless tobacco use included Chew. He reports that  drinks alcohol. He reports that he does not use illicit drugs.  Allergies:  Allergies  Allergen Reactions  . Atorvastatin Other (See Comments)    Muscle weakness   . Pravastatin Sodium Other (See Comments)    Muscle weakness   . Rosuvastatin Other (See Comments)    Muscle weakness     Medications Prior to Admission  Medication Sig Dispense Refill  . alfuzosin (UROXATRAL) 10 MG 24 hr tablet Take 10 mg by mouth daily.        Marland Kitchen aspirin EC 325 MG tablet Take 325 mg by mouth daily.        . benazepril (LOTENSIN) 10 MG tablet Take 10 mg by mouth daily.      Marland Kitchen docusate sodium 100 MG  CAPS Take 100 mg by mouth 2 (two) times daily.  10 capsule    . ergocalciferol (VITAMIN D2) 50000 UNITS capsule Take 50,000 Units by mouth every 14 (fourteen) days. 1st and 15th of each month      . ezetimibe-simvastatin (VYTORIN) 10-10 MG per tablet Take 1 tablet by mouth at bedtime.  30 tablet  0  . finasteride (PROSCAR) 5 MG tablet Take 5 mg by mouth daily.        . metFORMIN (GLUCOPHAGE) 500 MG tablet Take 1 tablet (500 mg total) by mouth 2 (two) times daily with a meal.  60 tablet  0  . methocarbamol (ROBAXIN) 500 MG tablet Take 500 mg by mouth at bedtime as needed (for muscle spasms/sleep).       . metoprolol succinate (TOPROL-XL) 50 MG 24 hr tablet Take 50 mg by mouth daily. Take with or immediately following a meal.      . traMADol (ULTRAM) 50 MG tablet Take 50 mg by mouth daily as needed for pain.        No results found for this or any previous visit (from the past 48 hour(s)). No results found.  ROS Review of Systems  Constitutional: Positive for weight loss. Negative for fever and chills.  HENT: Negative.   Respiratory: Negative.   Cardiovascular: Positive for leg swelling.       Both leg swelling.  Unable to get compression stocking on left leg.  Some decrease swelling since started diuretic yesterday.  Gastrointestinal: Negative.   Genitourinary: Negative.   Musculoskeletal: Positive for joint pain.       Left knee severe pain. Unable to straighten left knee and has been sleeping in recliner because he cannot straighten the left leg to lie in the bed.  Skin:       Weeping of skin on lower left leg and noted a reddened area at medial knee this am  Neurological: Negative.   Endo/Heme/Allergies: Negative.   Psychiatric/Behavioral: Negative.    Blood pressure 102/66, pulse 109, temperature 97.8 F (36.6 C), resp. rate 18, SpO2 100.00%. Physical Exam Physical Exam  Constitutional: He is oriented to person, place, and time. He appears distressed.  Sitting in wheelchair.   Frail appearing white male.  In obvious pain and leaning forward guarding left knee. Tearful at times.  HENT:  Head: Normocephalic and atraumatic.  Eyes: EOM are normal. Pupils are equal, round, and reactive to light.  Cardiovascular: Normal rate.   Respiratory: Effort normal.  GI: Soft.  Musculoskeletal:  Left knee with large effusion.  Pt holding  knee in flexed position and will not allow extension.  Flexed at 90 degrees and can only flex app. 10-20 degrees passively. Lower leg with pitting edema.  Erythema along the medial knee joint and onto the anterior lower leg. Weeping at distal lower leg around ankle.  Distal pulse not palpated on the left secondary to edema. Calf is soft and non tender.  Negative Homans left.  Right LE with compression stocking present and minimal edema.  ROM of right knee 0-90 without pain.  Neurological: He is alert and oriented to person, place, and time.  Skin: Skin is warm.  Psychiatric: He has a normal mood and affect.   Assessment/Plan Pyarthrosis left knee.  PLAN:  Admit for arthroscopic I&D of left knee, IV abx and disposition planning.  Hospitalist consult for assist with medical care.   Reisha Wos M 12/11/2012, 2:39 PM

## 2012-12-11 NOTE — OR Nursing (Signed)
Apple juice 120cc given for bs 63.  Will recheck.

## 2012-12-11 NOTE — Anesthesia Procedure Notes (Addendum)
Performed by: Ellin Goodie   Procedure Name: Intubation Date/Time: 12/11/2012 6:15 PM Performed by: Ellin Goodie Pre-anesthesia Checklist: Patient identified, Emergency Drugs available, Suction available, Patient being monitored and Timeout performed Patient Re-evaluated:Patient Re-evaluated prior to inductionOxygen Delivery Method: Circle system utilized Preoxygenation: Pre-oxygenation with 100% oxygen Intubation Type: IV induction Ventilation: Mask ventilation without difficulty Laryngoscope Size: Mac and 3 Grade View: Grade I Tube type: Oral Tube size: 7.5 mm Number of attempts: 1 Airway Equipment and Method: Stylet Secured at: 23 cm Tube secured with: Tape Dental Injury: Teeth and Oropharynx as per pre-operative assessment  Comments: Easy atraumatic induction and intubation.  Dr. Ivin Booty verified placement of ETT.  Carlynn Herald, CRNA

## 2012-12-11 NOTE — Progress Notes (Signed)
Blood sugar 75 on recheck.

## 2012-12-11 NOTE — Anesthesia Postprocedure Evaluation (Signed)
  Anesthesia Post-op Note  Patient: Curtis White  Procedure(s) Performed: Procedure(s) with comments: IRRIGATION AND DEBRIDEMENT EXTREMITY (Left) - Left Knee Arthroscopic Washout ARTHROSCOPY KNEE (Left) SYNOVECTOMY (Left)  Patient Location: PACU  Anesthesia Type:General  Level of Consciousness: awake  Airway and Oxygen Therapy: Patient Spontanous Breathing  Post-op Pain: mild  Post-op Assessment: Post-op Vital signs reviewed  Post-op Vital Signs: stable  Complications: No apparent anesthesia complications

## 2012-12-12 DIAGNOSIS — M79609 Pain in unspecified limb: Secondary | ICD-10-CM

## 2012-12-12 DIAGNOSIS — I82409 Acute embolism and thrombosis of unspecified deep veins of unspecified lower extremity: Secondary | ICD-10-CM

## 2012-12-12 LAB — PROTIME-INR
INR: 1.11 (ref 0.00–1.49)
Prothrombin Time: 14.1 seconds (ref 11.6–15.2)

## 2012-12-12 LAB — URINALYSIS, ROUTINE W REFLEX MICROSCOPIC
Bilirubin Urine: NEGATIVE
Hgb urine dipstick: NEGATIVE
Protein, ur: NEGATIVE mg/dL
Urobilinogen, UA: 0.2 mg/dL (ref 0.0–1.0)

## 2012-12-12 LAB — COMPREHENSIVE METABOLIC PANEL
ALT: 9 U/L (ref 0–53)
AST: 10 U/L (ref 0–37)
Albumin: 2 g/dL — ABNORMAL LOW (ref 3.5–5.2)
CO2: 26 mEq/L (ref 19–32)
Calcium: 7.5 mg/dL — ABNORMAL LOW (ref 8.4–10.5)
Chloride: 97 mEq/L (ref 96–112)
GFR calc non Af Amer: >90 mL/min (ref >90)
Sodium: 131 mEq/L — ABNORMAL LOW (ref 135–145)

## 2012-12-12 LAB — GLUCOSE, CAPILLARY
Glucose-Capillary: 99 mg/dL (ref 70–99)
Glucose-Capillary: 122 mg/dL — ABNORMAL HIGH (ref 70–99)

## 2012-12-12 LAB — CBC
MCH: 25.5 pg — ABNORMAL LOW (ref 26.0–34.0)
Platelets: 249 10*3/uL (ref 150–400)
RBC: 3.21 MIL/uL — ABNORMAL LOW (ref 4.22–5.81)
RDW: 17.5 % — ABNORMAL HIGH (ref 11.5–15.5)
WBC: 10.6 10*3/uL — ABNORMAL HIGH (ref 4.0–10.5)

## 2012-12-12 LAB — URINE MICROSCOPIC-ADD ON

## 2012-12-12 LAB — HEMOGLOBIN A1C: Mean Plasma Glucose: 183 mg/dL — ABNORMAL HIGH (ref <117)

## 2012-12-12 MED ORDER — WARFARIN - PHARMACIST DOSING INPATIENT
Freq: Every day | Status: DC
  Administered 2012-12-14: 18:00:00

## 2012-12-12 MED ORDER — ENOXAPARIN SODIUM 80 MG/0.8ML ~~LOC~~ SOLN
70.0000 mg | Freq: Two times a day (BID) | SUBCUTANEOUS | Status: AC
  Administered 2012-12-12 – 2012-12-16 (×9): 70 mg via SUBCUTANEOUS
  Filled 2012-12-12 (×12): qty 0.8

## 2012-12-12 MED ORDER — WARFARIN SODIUM 5 MG PO TABS
5.0000 mg | ORAL_TABLET | Freq: Once | ORAL | Status: AC
  Administered 2012-12-12: 5 mg via ORAL
  Filled 2012-12-12: qty 1

## 2012-12-12 MED ORDER — VANCOMYCIN HCL 10 G IV SOLR
1500.0000 mg | INTRAVENOUS | Status: DC
  Administered 2012-12-12 – 2012-12-14 (×3): 1500 mg via INTRAVENOUS
  Filled 2012-12-12 (×3): qty 1500

## 2012-12-12 MED ORDER — WARFARIN VIDEO: Freq: Once | Status: DC

## 2012-12-12 MED ORDER — ENSURE COMPLETE PO LIQD
237.0000 mL | Freq: Two times a day (BID) | ORAL | Status: DC
  Administered 2012-12-12 – 2012-12-17 (×11): 237 mL via ORAL

## 2012-12-12 MED ORDER — COUMADIN BOOK
Freq: Once | Status: DC
  Filled 2012-12-12: qty 1

## 2012-12-12 NOTE — Progress Notes (Signed)
Per vascular... Pt has bilateral lower leg DVT's.. Informed Dr [name]. Ordered Lovenox and Coumadin pharmacy protocol.

## 2012-12-12 NOTE — Progress Notes (Signed)
Subjective: 1 Day Post-Op Procedure(s) (LRB): IRRIGATION AND DEBRIDEMENT EXTREMITY (Left) ARTHROSCOPY KNEE (Left) SYNOVECTOMY (Left) Patient reports pain as moderate.    Objective: Vital signs in last 24 hours: Temp:  [97.5 F (36.4 C)-97.8 F (36.6 C)] 97.5 F (36.4 C) (07/31 0233) Pulse Rate:  [52-109] 91 (07/31 0533) Resp:  [12-19] 16 (07/31 0533) BP: (96-118)/(43-68) 100/52 mmHg (07/31 0533) SpO2:  [99 %-100 %] 99 % (07/31 0533)  Intake/Output from previous day: 07/30 0701 - 07/31 0700 In: 1140 [P.O.:240; I.V.:400; IV Piggyback:500] Out: 800 [Urine:800] Intake/Output this shift:     Recent Labs  12/11/12 1419 12/12/12 0435  HGB 9.4* 8.2*    Recent Labs  12/11/12 1419 12/12/12 0435  WBC 12.5* 10.6*  RBC 3.76* 3.21*  HCT 28.2* 24.0*  PLT 261 249    Recent Labs  12/11/12 1419 12/12/12 0435  NA 128* 131*  K 3.7 3.4*  CL 91* 97  CO2 26 26  BUN 22 16  CREATININE 0.77 0.51  GLUCOSE 105* 51*  CALCIUM 8.1* 7.5*   No results found for this basename: LABPT, INR,  in the last 72 hours  Neurologically intact  Assessment/Plan: 1 Day Post-Op Procedure(s) (LRB): IRRIGATION AND DEBRIDEMENT EXTREMITY (Left) ARTHROSCOPY KNEE (Left) SYNOVECTOMY (Left) Plan  Continue ABX start knee motion tomorrow. Awaiting cultures.  Franz Svec C 12/12/2012, 7:58 AM

## 2012-12-12 NOTE — Progress Notes (Signed)
VASCULAR LAB PRELIMINARY  PRELIMINARY  PRELIMINARY  PRELIMINARY  Bilateral lower extremity venous duplex completed.    Preliminary report:  Right - Positive for acute popliteal DVT. Left - Positive for acute DVT coursing from the popliteal vein through the entire femoral vein. Bilateral - no evidence of superficial thrombosis or Baker's cysy.  Sophie Quiles, RVS 12/12/2012, 12:51 PM

## 2012-12-12 NOTE — Progress Notes (Signed)
INITIAL NUTRITION ASSESSMENT  DOCUMENTATION CODES Per approved criteria  -Severe malnutrition in the context of chronic illness   INTERVENTION: Ensure Complete po BID, each supplement provides 350 kcal and 13 grams of protein.  NUTRITION DIAGNOSIS: Malnutrition related to chronic illness as evidenced by severe fat and muscle wasting, 14% weight loss x 3 months, and estimated intake of < 75% of his needs for > 1 month.   Goal: Pt to meet >/= 90% of their estimated nutrition needs   Monitor:  PO intake, supplement acceptance, weight trend, labs   Reason for Assessment: Pt identified as at nutrition risk on the Malnutrition Screen Tool  79 y.o. male  Admitting Dx: Bacterial infection of knee joint  ASSESSMENT: Pt is s/p I&D left knee. Per pt he starting losing weight 07/2012 after breaking his hip. Per pt he has been mostly eating soup and snacks. Pt had been at home with his daughter and he does not like what she cooks. Pt with 14% weight loss x 3 months.   Height: Ht Readings from Last 1 Encounters:  12/12/12 5' 10.08" (1.78 m)    Weight: Wt Readings from Last 1 Encounters:  12/12/12 154 lb (69.854 kg)    Ideal Body Weight: 75.4 kg   % Ideal Body Weight: 93%  Wt Readings from Last 10 Encounters:  12/12/12 154 lb (69.854 kg)  12/12/12 154 lb (69.854 kg)  08/17/12 180 lb (81.647 kg)  08/17/12 180 lb (81.647 kg)  07/30/12 180 lb (81.647 kg)  07/30/12 180 lb (81.647 kg)  06/20/11 193 lb (87.544 kg)  01/02/11 193 lb (87.544 kg)  07/06/10 201 lb (91.173 kg)  07/04/10 197 lb (89.359 kg)    Usual Body Weight: 180 lb   % Usual Body Weight: 86%  BMI:  22.1  Estimated Nutritional Needs: Kcal: 1700-1900 Protein: 80-90 grams Fluid: > 1.7 L/day  Skin: incision left knee  Diet Order: Carb Control Meal Completion: 50%   EDUCATION NEEDS: -No education needs identified at this time   Intake/Output Summary (Last 24 hours) at 12/12/12 1152 Last data filed at  12/12/12 0900  Gross per 24 hour  Intake   1380 ml  Output   1000 ml  Net    380 ml    Last BM: 7/29   Labs:   Recent Labs Lab 12/11/12 1419 12/12/12 0435  NA 128* 131*  K 3.7 3.4*  CL 91* 97  CO2 26 26  BUN 22 16  CREATININE 0.77 0.51  CALCIUM 8.1* 7.5*  GLUCOSE 105* 51*    CBG (last 3)   Recent Labs  12/11/12 2011 12/11/12 2138 12/12/12 0651  GLUCAP 75 95 99   Lab Results  Component Value Date   HGBA1C 6.0* 07/29/2012   Scheduled Meds: . alfuzosin  10 mg Oral Daily  . ampicillin-sulbactam (UNASYN) IV  3 g Intravenous Q6H  . benazepril  10 mg Oral Daily  . docusate sodium  100 mg Oral BID  . enoxaparin (LOVENOX) injection  40 mg Subcutaneous Q24H  . ezetimibe  10 mg Oral QPM   And  . simvastatin  10 mg Oral QPM  . finasteride  5 mg Oral Daily  . furosemide  40 mg Intravenous Daily  . insulin aspart  0-9 Units Subcutaneous TID WC  . metoprolol succinate  50 mg Oral QPC supper  . vancomycin  1,500 mg Intravenous Q24H    Continuous Infusions: . 0.45 % NaCl with KCl 20 mEq / L 75 mL/hr (12/12/12 0208)  .  lactated ringers 75 mL/hr at 12/11/12 1714    Past Medical History  Diagnosis Date  . Mixed hyperlipidemia   . Benign essential HTN   . CAD (coronary artery disease)     a. CABG 1983. b. Re-do CABG 1997. c. Grafts patent 2003.  Marland Kitchen Hyperlipidemia   . Skin rash     of uncertain etiology  . Prostate enlargement     "epididymysis"  . CHF (congestive heart failure)   . Type II diabetes mellitus     controlled  . Shortness of breath     "before first heart surgery" (08/16/2012)  . GERD (gastroesophageal reflux disease)   . Hepatitis     "[employer] quit taking my blood cause they say I had this" (08/16/2012)  . Arthritis     "some, in my calves/legs I guess"  (08/16/2012)    Past Surgical History  Procedure Laterality Date  . Hydrocele excision Left   . Cardiac catheterization      "I think I've had 4" (08/16/2012)  . Compression hip screw Left  07/30/2012    Procedure: Left Trochanteric Compression Screw (Long);  Surgeon: [name], MD;  Location: [facility] OR;  Service: Orthopedics;  Laterality: Left;  . Tonsillectomy  1939  . Hernia repair Right 2012  . Coronary artery bypass graft  1983; ?1996    CABG X4; CABG X1  . Cataract extraction w/ intraocular lens  implant, bilateral Bilateral ~ 2000  . Incision and drainage hip Left 08/16/2012    hematoma (08/16/2012)  . Incision and drainage hip Left 08/16/2012    Procedure: IRRIGATION AND DEBRIDEMENT HIP;  Surgeon: [name], MD;  Location: [facility] OR;  Service: Orthopedics;  Laterality: Left;  Irrigation and Debridement Left Hip    Kendell Bane RD, LDN, CNSC [phone] Pager [phone] After Hours Pager

## 2012-12-12 NOTE — Op Note (Signed)
Curtis White, Curtis White NO.:  [id number]  MEDICAL RECORD NO.:  [id number]  LOCATION:  5N21C                        FACILITY:  [facility]  PHYSICIAN:  Mark C. [name], M.D.    DATE OF BIRTH:  08/23/1921  DATE OF PROCEDURE:  12/11/2012 DATE OF DISCHARGE:                              OPERATIVE REPORT   PREOPERATIVE DIAGNOSIS:  Left knee pyarthrosis.  POSTOPERATIVE DIAGNOSIS:  Left knee pyarthrosis.  PROCEDURE:  Left knee arthroscopy, partial synovectomy for acute pyarthrosis.  SURGEON:  Mark C. [name], M.D.  TOURNIQUET:  None.  BRIEF HISTORY:  This 79 year old male had previous left hip fracture, has diabetes and had fixation of a hip fracture 4 months ago for 14 with intramedullary hip screw, had been seen in the office with pain, some swelling in lower extremity, some aching in his knee and had intra- articular injection with cortisone 5-7 days ago in the office by Dr. Jorge Ny and has had increased pain, difficulty walking.  He re-presented to the office today with acute pain and knee aspiration showed purulence.  Gram stain showed gram-positive cocci and pairs and chains.  PROCEDURE:  After induction of general anesthesia, proximal leg holder, prepping with DuraPrep from ankle to the leg holder, sterile U sheet was applied, impervious sterile stockinette, Coban and arthroscopic sheets, drapes, and pouch.  Time-out procedure was completed.  The patient had Zosyn that was being given.  The knee was aspirated.  Cultures were obtained with purulent material aerobic and anaerobic.  The patient also had cultures sent out to [facility] from our office today when he 1st presented and the knee was aspirated.  I then placed superolateral and suprapatellar portal, medial and lateral parapatellar tendon portals were used for scope and probe placement. Patellofemoral joint was insufflated 70 cm pump.  The light cord had been turned on and burn to the drape and partially to the  patient's gown.  Skin was normal.  This was checked, light cord was passed off. Betadine and Steri-Drape was applied over the area of the paper drape. An inspection of the patient's stomach underneath drape showed no injury.  New light cord was hooked up and arthroscopic evaluation of the knee was performed.  Partial synovectomy was performed using 4.2 shaver. There was some patellofemoral degenerative changes present.  Synovitis changes.  No periarticular erosion.  Medial and lateral compartments were evaluated and synovectomy was performed.  ACL and PCL were present and functioning normally under exam.  After thorough lavage, Hemovac was placed through the superolateral portal.  The portals were closed with 3-0 nylon.  4x4s, ABDs, Webril, Ace wrap, knee immobilizer were applied postoperatively.  The patient tolerated the procedure well, was transferred to recovery room in stable condition.     Mark C. [name], M.D.     MCY/MEDQ  D:  12/11/2012  T:  12/12/2012  Job:  [id number]

## 2012-12-12 NOTE — Progress Notes (Signed)
Orthopedic Tech Progress Note Patient Details:  Curtis White [birth year] [id number] Patient refused ohf says he unable to use. Patient ID: Curtis White, male   DOB: 1922/03/16, 79 y.o.   MRN: [id number]   [name] 12/12/2012, 8:33 PM

## 2012-12-12 NOTE — Progress Notes (Addendum)
ANTICOAGULATION CONSULT NOTE - Initial Consult  Pharmacy Consult for warfarin + lovenox Indication: DVT  Allergies  Allergen Reactions  . Atorvastatin Other (See Comments)    Muscle weakness   . Pravastatin Sodium Other (See Comments)    Muscle weakness   . Rosuvastatin Other (See Comments)    Muscle weakness     Patient Measurements: Height: 5' 10.08" (178 cm) Weight: 154 lb (69.854 kg) (bedscale) IBW/kg (Calculated) : 73.18  Vital Signs: Temp: 97.5 F (36.4 C) (07/31 0233) BP: 100/52 mmHg (07/31 0533) Pulse Rate: 91 (07/31 0533)  Labs:  Recent Labs  12/11/12 1419 12/12/12 0435  HGB 9.4* 8.2*  HCT 28.2* 24.0*  PLT 261 249  CREATININE 0.77 0.51    Estimated Creatinine Clearance: 61.9 ml/min (by C-G formula based on Cr of 0.51).   Medical History: Past Medical History  Diagnosis Date  . Mixed hyperlipidemia   . Benign essential HTN   . CAD (coronary artery disease)     a. CABG 1983. b. Re-do CABG 1997. c. Grafts patent 2003.  Marland Kitchen Hyperlipidemia   . Skin rash     of uncertain etiology  . Prostate enlargement     "epididymysis"  . CHF (congestive heart failure)   . Type II diabetes mellitus     controlled  . Shortness of breath     "before first heart surgery" (08/16/2012)  . GERD (gastroesophageal reflux disease)   . Hepatitis     "[employer] quit taking my blood cause they say I had this" (08/16/2012)  . Arthritis     "some, in my calves/legs I guess"  (08/16/2012)    Medications:  Prescriptions prior to admission  Medication Sig Dispense Refill  . alfuzosin (UROXATRAL) 10 MG 24 hr tablet Take 10 mg by mouth daily.        Marland Kitchen aspirin 81 MG tablet Take 81 mg by mouth daily.      . diclofenac (FLECTOR) 1.3 % PTCH Place 1 patch onto the skin 2 (two) times daily.      . diclofenac sodium (VOLTAREN) 1 % GEL Apply 1 application topically daily as needed (for arthritis). Applies to left knee      . docusate sodium (COLACE) 100 MG capsule Take 100 mg by mouth  daily.      . ergocalciferol (VITAMIN D2) 50000 UNITS capsule Take 50,000 Units by mouth every 14 (fourteen) days. 1st and 15th of each month      . ezetimibe-simvastatin (VYTORIN) 10-10 MG per tablet Take 1 tablet by mouth at bedtime.  30 tablet  0  . finasteride (PROSCAR) 5 MG tablet Take 5 mg by mouth daily.        Marland Kitchen glimepiride (AMARYL) 4 MG tablet Take 4 mg by mouth daily.      Marland Kitchen HYDROcodone-acetaminophen (NORCO/VICODIN) 5-325 MG per tablet Take 2 tablets by mouth every 6 (six) hours as needed for pain.      . metFORMIN (GLUCOPHAGE) 1000 MG tablet Take 1,000 mg by mouth 2 (two) times daily.      . metFORMIN (GLUCOPHAGE) 500 MG tablet Take 1 tablet (500 mg total) by mouth 2 (two) times daily with a meal.  60 tablet  0  . methocarbamol (ROBAXIN) 500 MG tablet Take 500 mg by mouth at bedtime as needed (for muscle spasms/sleep).       . metoprolol succinate (TOPROL-XL) 50 MG 24 hr tablet Take 50 mg by mouth daily. Take with or immediately following a meal.      .  omeprazole (PRILOSEC) 20 MG capsule Take 20 mg by mouth daily.      . ondansetron (ZOFRAN) 4 MG tablet Take 4 mg by mouth every 8 (eight) hours as needed (for gas).      . traMADol (ULTRAM) 50 MG tablet Take 50 mg by mouth daily as needed for pain.        Assessment: 89 yom s/p L knee arthorscopy and partial synovectomy for acute pyarthrosis now with new bilateral DVT to start anticoagulation with warfarin + lovenox bridge. No baseline INR available at this time. H/H 8.2/24, plts 249, no overt bleeding noted.  Goal of Therapy:  INR 2-3 Anti-Xa level 0.6-1.2 units/ml 4hrs after LMWH dose given Monitor platelets by anticoagulation protocol: Yes   Plan:  1. Warfarin 5mg  PO x 1 tonight if baseline INR is WNL 2. INR now and daily 3. Warfarin book + video to patient 4. Lovenox 70mg  SQ Q12H 5. CBC Q72H while on lovenox  Daymon Hora, Drake Leach 12/12/2012,1:45 PM  Addendum: Baseline INR is 1.11. Continue with previous  plan.  Lysle Pearl, PharmD, BCPS Pager # [phone] 12/12/2012 3:59 PM

## 2012-12-12 NOTE — Evaluation (Signed)
Physical Therapy Evaluation Patient Details Name: Curtis White MRN: [id number] DOB: Nov 03, 1921 Today's Date: 12/12/2012 Time: 4098-1191 PT Time Calculation (min): 20 min  PT Assessment / Plan / Recommendation History of Present Illness  fell in march 2014 and fx L hip;  Clinical Impression  Patient is s/p  I&D, knee arthroscopy and synovectomy of L knee resulting in functional limitations due to the deficits listed below (see PT Problem List). Pt has been limited in mobility since March 2014 when he fell and fx L hip. Pt with limited (A) at home, will require STSNF upon acute D/C. Patient will benefit from skilled PT to increase their independence and safety with mobility to allow discharge to the venue listed below.       PT Assessment  Patient needs continued PT services    Follow Up Recommendations  SNF;Supervision/Assistance - 24 hour    Does the patient have the potential to tolerate intense rehabilitation    n/a   Barriers to Discharge Decreased caregiver support      Equipment Recommendations  None recommended by PT    Recommendations for Other Services     Frequency Min 4X/week    Precautions / Restrictions Precautions Precautions: Fall Required Braces or Orthoses: Knee Immobilizer - Left Knee Immobilizer - Left: Other (comment) (ROM to start 8/1 per MD note) Restrictions Weight Bearing Restrictions: Yes LLE Weight Bearing: Weight bearing as tolerated   Pertinent Vitals/Pain Did not rate pain.       Mobility  Bed Mobility Bed Mobility: Supine to Sit;Sitting - Scoot to Edge of Bed Supine to Sit: 4: Min assist;HOB elevated;With rails Sitting - Scoot to Edge of Bed: 4: Min assist Details for Bed Mobility Assistance: (A) to advance L LE to/off EOB; mod vc's for hand placement and sequencing; pt requires increased time due to pain; min facilitation to bring trunk to upright position  Transfers Transfers: Lateral/Scoot Transfers Lateral/Scoot Transfers: 4: Min  assist;From elevated surface Details for Transfer Assistance: pt deferred standing at this time; performed lateral transfer bed to wheelchair to simulate how he performs transfers at home; pt required min (A) and setup (A); requires increased time to complete task; pt is impulsive and demo decreased safety awareness  Ambulation/Gait Ambulation/Gait Assistance: Not tested (comment) General Gait Details: pt reports he hss not amb since march  Stairs: No Wheelchair Mobility Wheelchair Mobility: No    Exercises General Exercises - Lower Extremity Ankle Circles/Pumps: AROM;10 reps;Seated   PT Diagnosis: Difficulty walking;Generalized weakness;Acute pain  PT Problem List: Decreased strength;Decreased activity tolerance;Decreased range of motion;Decreased balance;Decreased mobility;Decreased cognition;Decreased knowledge of use of DME;Decreased safety awareness PT Treatment Interventions: DME instruction;Gait training;Functional mobility training;Therapeutic activities;Therapeutic exercise;Balance training;Neuromuscular re-education;Patient/family education     PT Goals(Current goals can be found in the care plan section) Acute Rehab PT Goals Patient Stated Goal: to get home and some rest PT Goal Formulation: With patient Time For Goal Achievement: 12/19/12 Potential to Achieve Goals: Fair  Visit Information  Last PT Received On: 12/12/12 Assistance Needed: +2 History of Present Illness: fell in march 2014 and fx L hip;       Prior Functioning  Home Living Family/patient expects to be discharged to:: Private residence Living Arrangements: Spouse/significant other;Children (daughter and mother not able to provide (A) needed) Available Help at Discharge: Skilled Nursing Facility Type of Home: House Home Access: Ramped entrance Home Equipment: Environmental consultant - 2 wheels;Cane - single point;Bedside commode;Grab bars - toilet;Grab bars - tub/shower;Shower seat Additional Comments: has a walk in  shower Prior Function Level of Independence: Needs assistance Gait / Transfers Assistance Needed: mod i to supervision for transfers per daughter till 3 weeks ago  ADL's / Homemaking Assistance Needed: daughter provides total (A) for cooking, housekeeping; pt able to bath himself up till 3 weeks ago  Comments: spoke with daughter; pt was mod i and performed sliding board transfers wheelchair to other surfaces with setup (A); in the last 3 weeks pt has become more dependent and requires increased (A); daughter is unable to provide (A) needed at this time due to medical complications she has; will need SNF upon D/C Communication Communication: HOH Dominant Hand: Right    Cognition  Cognition Arousal/Alertness: Awake/alert Behavior During Therapy: WFL for tasks assessed/performed Overall Cognitive Status: Within Functional Limits for tasks assessed Memory: Decreased short-term memory    Extremity/Trunk Assessment Upper Extremity Assessment Upper Extremity Assessment: Defer to OT evaluation Lower Extremity Assessment Lower Extremity Assessment: Generalized weakness Cervical / Trunk Assessment Cervical / Trunk Assessment: Kyphotic   Balance Balance Balance Assessed: Yes Static Sitting Balance Static Sitting - Balance Support: Bilateral upper extremity supported;Feet unsupported Static Sitting - Level of Assistance: 4: Min assist;5: Stand by assistance  End of Session PT - End of Session Equipment Utilized During Treatment: Gait belt Activity Tolerance: Patient tolerated treatment well Patient left: in chair;with call bell/phone within reach Nurse Communication: Mobility status  GP     Curtis White, Curtis White [phone] 12/12/2012, 1:39 PM

## 2012-12-12 NOTE — Progress Notes (Signed)
[facility] PROGRESS NOTE  Curtis White ZOX:[id number] DOB: 09/20/1921 DOA: 12/11/2012 PCP: [name], MD  Assessment/Plan: Principal Problem:   Acute DVT (deep venous thrombosis): Anticoagulation started. Lovenox plus Coumadin    DIABETES MELLITUS, TYPE II, CONTROLLED: CBGs ranging between 100-180    HYPERLIPIDEMIA-MIXED    HYPERTENSION, BENIGN: Blood pressure stable: Continue home meds.    Chronic systolic CHF (congestive heart failure): Will check echo for complete thoroughness. On Lasix.    Bacterial infection of knee joint: continue IV Unasyn. Status post I&D, arthroscopy & synovectomy.  Code Status: full code   Consultants:  [facility]  Procedures: Lower Ext dopplers: Done 7/31.  Findings consistent with acute deep vein thrombosis involving popliteal vein of the right lower extremity. - Findings consistent with acute deep vein thrombosis involving the politeal and femoral veins of theleft lower Extremity.   Antibiotics:  IV Unasyn Day 2   Objective: Filed Vitals:   12/11/12 2132 12/12/12 0233 12/12/12 0533 12/12/12 1100  BP: 101/53 96/49 100/52   Pulse: 109 53 91   Temp:  97.5 F (36.4 C)    Resp: 16 15 16    Height:    5' 10.08" (1.78 m)  Weight:    69.854 kg (154 lb)  SpO2: 99% 99% 99%     Intake/Output Summary (Last 24 hours) at 12/12/12 1152 Last data filed at 12/12/12 0900  Gross per 24 hour  Intake   1380 ml  Output   1000 ml  Net    380 ml   Filed Weights   12/12/12 1100  Weight: 69.854 kg (154 lb)      Data Reviewed: Basic Metabolic Panel:  Recent Labs Lab 12/11/12 1419 12/12/12 0435  NA 128* 131*  K 3.7 3.4*  CL 91* 97  CO2 26 26  GLUCOSE 105* 51*  BUN 22 16  CREATININE 0.77 0.51  CALCIUM 8.1* 7.5*   Liver Function Tests:  Recent Labs Lab 12/11/12 1419 12/12/12 0435  AST 12 10  ALT 14 9  ALKPHOS 93 72  BILITOT 0.4 0.3  PROT 6.0 4.8*  ALBUMIN 2.1* 2.0*   CBC:  Recent Labs Lab  12/11/12 1419 12/12/12 0435  WBC 12.5* 10.6*  NEUTROABS 11.1*  --   HGB 9.4* 8.2*  HCT 28.2* 24.0*  MCV 75.0* 74.8*  PLT 261 249   CBG:  Recent Labs Lab 12/11/12 1934 12/11/12 1956 12/11/12 2011 12/11/12 2138 12/12/12 0651  GLUCAP 60* 69* 75 95 99    Recent Results (from the past 240 hour(s))  BODY FLUID CULTURE     Status: None   Collection Time    12/11/12  4:46 PM      Result Value Range Status   Specimen Description SYNOVIAL RIGHT KNEE   Final   Special Requests PATIENT ON FOLLOWING UNASYN FLUID ON SWAB   Final   Gram Stain     Final   Value: ABUNDANT WBC PRESENT, PREDOMINANTLY PMN     RARE GRAM POSITIVE COCCI     IN PAIRS IN CHAINS   Culture PENDING   Incomplete   Report Status PENDING   Incomplete  AFB CULTURE WITH SMEAR     Status: None   Collection Time    12/11/12  4:46 PM      Result Value Range Status   Specimen Description SYNOVIAL RIGHT KNEE   Final   Special Requests PATIENT ON FOLLOWING UNASYN FLUID ON SWAB   Final   ACID FAST SMEAR PENDING   Incomplete  Culture PENDING   Incomplete   Report Status PENDING   Incomplete  ANAEROBIC CULTURE     Status: None   Collection Time    12/11/12  4:46 PM      Result Value Range Status   Specimen Description SYNOVIAL RIGHT KNEE   Final   Special Requests PATIENT ON FOLLOWING UNASYN FLUID ON SWAB   Final   Gram Stain     Final   Value: ABUNDANT WBC PRESENT, PREDOMINANTLY PMN     RARE GRAM POSITIVE COCCI     IN PAIRS IN CHAINS   Culture PENDING   Incomplete   Report Status PENDING   Incomplete     Studies: No results found.  Scheduled Meds: . alfuzosin  10 mg Oral Daily  . ampicillin-sulbactam (UNASYN) IV  3 g Intravenous Q6H  . benazepril  10 mg Oral Daily  . docusate sodium  100 mg Oral BID  . enoxaparin (LOVENOX) injection  40 mg Subcutaneous Q24H  . ezetimibe  10 mg Oral QPM   And  . simvastatin  10 mg Oral QPM  . finasteride  5 mg Oral Daily  . furosemide  40 mg Intravenous Daily  .  insulin aspart  0-9 Units Subcutaneous TID WC  . metoprolol succinate  50 mg Oral QPC supper  . vancomycin  1,500 mg Intravenous Q24H   Continuous Infusions: . 0.45 % NaCl with KCl 20 mEq / L 75 mL/hr (12/12/12 0208)  . lactated ringers 75 mL/hr at 12/11/12 1714    Principal Problem:   Bacterial infection of knee joint    Time spent: 20 min    Hollice Espy  [facility] Pager [phone]. If 7PM-7AM, please contact night-coverage at [email or web address], password [facility] 12/12/2012, 11:52 AM  LOS: 1 day

## 2012-12-13 ENCOUNTER — Encounter (HOSPITAL_COMMUNITY): Payer: Self-pay | Admitting: Orthopaedic Surgery

## 2012-12-13 DIAGNOSIS — I5023 Acute on chronic systolic (congestive) heart failure: Secondary | ICD-10-CM | POA: Diagnosis present

## 2012-12-13 DIAGNOSIS — E43 Unspecified severe protein-calorie malnutrition: Secondary | ICD-10-CM | POA: Diagnosis present

## 2012-12-13 LAB — BASIC METABOLIC PANEL
GFR calc Af Amer: >90 mL/min (ref >90)
GFR calc non Af Amer: 90 mL/min — ABNORMAL LOW (ref >90)
Potassium: 3.4 mEq/L — ABNORMAL LOW (ref 3.5–5.1)
Sodium: 136 mEq/L (ref 135–145)

## 2012-12-13 LAB — CBC
Platelets: 265 10*3/uL (ref 150–400)
RDW: 17.9 % — ABNORMAL HIGH (ref 11.5–15.5)
WBC: 10.9 10*3/uL — ABNORMAL HIGH (ref 4.0–10.5)

## 2012-12-13 LAB — GLUCOSE, CAPILLARY
Glucose-Capillary: 77 mg/dL (ref 70–99)
Glucose-Capillary: 112 mg/dL — ABNORMAL HIGH (ref 70–99)
Glucose-Capillary: 115 mg/dL — ABNORMAL HIGH (ref 70–99)

## 2012-12-13 MED ORDER — POTASSIUM CHLORIDE CRYS ER 20 MEQ PO TBCR
40.0000 meq | EXTENDED_RELEASE_TABLET | Freq: Once | ORAL | Status: AC
  Administered 2012-12-13: 40 meq via ORAL
  Filled 2012-12-13: qty 2

## 2012-12-13 MED ORDER — LORAZEPAM 0.5 MG PO TABS
0.5000 mg | ORAL_TABLET | Freq: Once | ORAL | Status: AC
  Administered 2012-12-13: 0.5 mg via ORAL
  Filled 2012-12-13: qty 1

## 2012-12-13 MED ORDER — PANTOPRAZOLE SODIUM 40 MG PO TBEC
40.0000 mg | DELAYED_RELEASE_TABLET | Freq: Every day | ORAL | Status: DC
  Administered 2012-12-14 – 2012-12-17 (×4): 40 mg via ORAL
  Filled 2012-12-13 (×4): qty 1

## 2012-12-13 MED ORDER — WARFARIN SODIUM 5 MG PO TABS
5.0000 mg | ORAL_TABLET | Freq: Once | ORAL | Status: AC
  Administered 2012-12-13: 5 mg via ORAL
  Filled 2012-12-13: qty 1

## 2012-12-13 MED ORDER — FUROSEMIDE 10 MG/ML IJ SOLN
40.0000 mg | Freq: Two times a day (BID) | INTRAMUSCULAR | Status: DC
  Administered 2012-12-14 – 2012-12-16 (×5): 40 mg via INTRAVENOUS
  Filled 2012-12-13 (×9): qty 4

## 2012-12-13 MED ORDER — CIPROFLOXACIN HCL 500 MG PO TABS
500.0000 mg | ORAL_TABLET | Freq: Two times a day (BID) | ORAL | Status: DC
  Filled 2012-12-13 (×3): qty 1

## 2012-12-13 MED ORDER — POTASSIUM CHLORIDE CRYS ER 20 MEQ PO TBCR
20.0000 meq | EXTENDED_RELEASE_TABLET | Freq: Every day | ORAL | Status: DC
  Administered 2012-12-14 – 2012-12-17 (×4): 20 meq via ORAL
  Filled 2012-12-13 (×4): qty 1

## 2012-12-13 MED ORDER — BENAZEPRIL HCL 5 MG PO TABS
2.5000 mg | ORAL_TABLET | Freq: Every day | ORAL | Status: DC
  Administered 2012-12-14 – 2012-12-17 (×4): 2.5 mg via ORAL
  Filled 2012-12-13 (×4): qty 1

## 2012-12-13 NOTE — Progress Notes (Signed)
Physical Therapy Treatment Patient Details Name: Curtis White MRN: [id number] DOB: [birth year] Today's Date: 12/13/2012 Time: 7829-5621 PT Time Calculation (min): 23 min  PT Assessment / Plan / Recommendation  History of Present Illness fell in march 2014 and fx L hip;   PT Comments   Pt requires max encouragement to participate in therapy but is motivated with encouragement. Pt requires mod (A) for transfers. Recommend 2+ for (A) if attempting to amb. Pt presents with overall deconditioning and decreased strength due to immobility prior to surgery. Will benefit from STSNF upon acute D/C to increase independence and decrease caregiver burden upon returning home.  Pt and granddaughter had questions regarding CPM; wear schedule and benefits discussed with them.   Follow Up Recommendations  SNF;Supervision/Assistance - 24 hour     Does the patient have the potential to tolerate intense rehabilitation     Barriers to Discharge        Equipment Recommendations  None recommended by PT    Recommendations for Other Services    Frequency Min 3X/week   Progress towards PT Goals Progress towards PT goals: Progressing toward goals  Plan Frequency needs to be updated    Precautions / Restrictions Precautions Precautions: Fall Required Braces or Orthoses: Knee Immobilizer - Left Knee Immobilizer - Left: Other (comment) (until discontinued ) Restrictions Weight Bearing Restrictions: Yes LLE Weight Bearing: Weight bearing as tolerated   Pertinent Vitals/Pain "well im not in any pain when im just sitting here."    Mobility  Bed Mobility Bed Mobility: Supine to Sit Supine to Sit: 3: Mod assist;HOB elevated;With rails Details for Bed Mobility Assistance: (A) to advance L LE to/off EOB; use of draw pad to bring shoulders to upright sitting position; required increased time and max multimodal cues for hand placement and sequencing; pt unable to use bil UEs fully due to pain and rotator cuff  injuries  Transfers Transfers: Sit to Stand;Stand to Sit;Stand Pivot Transfers Sit to Stand: 3: Mod assist;From bed;With upper extremity assist;From elevated surface Stand to Sit: 3: Mod assist;To chair/3-in-1;With armrests;With upper extremity assist Stand Pivot Transfers: 3: Mod assist;From elevated surface Details for Transfer Assistance: pt required elevated bed and (A) to achieve full upright standing position and maintain balance. pt very anxious and stated "i am not allowed to stand up this is going to hurt" pt required max encouragement to attempt  Stand <> sit and SPT Ambulation/Gait Ambulation/Gait Assistance: Not tested (comment) Stairs: No Wheelchair Mobility Wheelchair Mobility: No    Exercises General Exercises - Lower Extremity Ankle Circles/Pumps: AROM;Both;Supine;20 reps;Seated Heel Slides: PROM;Left;10 reps;Supine;Other (comment) (pt resistant; required 2 rest breaks and breathing exercises)   PT Diagnosis:    PT Problem List:   PT Treatment Interventions:     PT Goals (current goals can now be found in the care plan section) Acute Rehab PT Goals Patient Stated Goal: none stated today PT Goal Formulation: With patient Time For Goal Achievement: 12/19/12 Potential to Achieve Goals: Fair  Visit Information  Last PT Received On: 12/13/12 Assistance Needed: +2 History of Present Illness: fell in march 2014 and fx L hip;    Subjective Data  Subjective: Pt lying supine; repeatedly asks 'what are we going to do. are we getting up or what?"  Patient Stated Goal: none stated today   Cognition  Cognition Arousal/Alertness: Awake/alert Behavior During Therapy: Anxious Overall Cognitive Status: Within Functional Limits for tasks assessed Memory: Decreased short-term memory    Balance  Balance Balance Assessed: Yes Static Sitting  Balance Static Sitting - Balance Support: Bilateral upper extremity supported;Feet unsupported Static Sitting - Level of Assistance: 5:  Stand by assistance Static Sitting - Comment/# of Minutes: pt tolerated sitting EOB ~3 min   End of Session PT - End of Session Equipment Utilized During Treatment: Gait belt;Left knee immobilizer Activity Tolerance: Patient limited by pain;Patient limited by fatigue Patient left: in chair;with call bell/phone within reach;with family/visitor present Nurse Communication: Mobility status CPM Left Knee CPM Left Knee: On Left Knee Flexion (Degrees): 90 Left Knee Extension (Degrees): 0   GP     Shelva Majestic [place], [place] [phone] 12/13/2012, 2:50 PM

## 2012-12-13 NOTE — Progress Notes (Signed)
Orthopedic Tech Progress Note Patient Details:  Curtis White [birth year] [phone] Off cpm at 8:00 pm. Patient ID: Curtis White, male   DOB: 1921-05-27, 79 y.o.   MRN: [id number]   Curtis White 12/13/2012, 8:09 PM

## 2012-12-13 NOTE — Progress Notes (Signed)
Subjective: 2 Days Post-Op Procedure(s) (LRB): IRRIGATION AND DEBRIDEMENT EXTREMITY (Left) ARTHROSCOPY KNEE (Left) SYNOVECTOMY (Left) Patient reports pain as moderate.  Better than pre op.  Still having trouble with ROM of knee and will not go to full extension ] Will start CPM  Objective: Vital signs in last 24 hours: Temp:  [97.6 F (36.4 C)-98.1 F (36.7 C)] 97.6 F (36.4 C) (08/01 0459) Pulse Rate:  [63-94] 63 (08/01 0459) Resp:  [16-18] 16 (08/01 0459) BP: (90-103)/(44-53) 103/52 mmHg (08/01 0459) SpO2:  [96 %-98 %] 98 % (08/01 0459) Weight:  [68.2 kg (150 lb 5.7 oz)] 68.2 kg (150 lb 5.7 oz) (07/31 1500)  Intake/Output from previous day: 07/31 0701 - 08/01 0700 In: 240 [P.O.:240] Out: 800 [Urine:800] Intake/Output this shift: Total I/O In: 480 [P.O.:480] Out: 150 [Urine:150]   Recent Labs  12/11/12 1419 12/12/12 0435 12/13/12 0545  HGB 9.4* 8.2* 8.3*    Recent Labs  12/12/12 0435 12/13/12 0545  WBC 10.6* 10.9*  RBC 3.21* 3.30*  HCT 24.0* 25.0*  PLT 249 265    Recent Labs  12/12/12 0435 12/13/12 0545  NA 131* 136  K 3.4* 3.4*  CL 97 98  CO2 26 31  BUN 16 12  CREATININE 0.51 0.55  GLUCOSE 51* 81  CALCIUM 7.5* 7.3*    Recent Labs  12/12/12 1446 12/13/12 0545  INR 1.11 1.22    Neurovascular intact Sensation intact distally Dorsiflexion/Plantar flexion intact Incision: no drainage  Assessment/Plan: 2 Days Post-Op Procedure(s) (LRB): IRRIGATION AND DEBRIDEMENT EXTREMITY (Left) ARTHROSCOPY KNEE (Left) SYNOVECTOMY (Left) Up with therapy Continue ABX therapy due to 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 Start CPM Pt request meds for heartburn Agree with NHP  Anabela Crayton M 12/13/2012, 11:10 AM

## 2012-12-13 NOTE — Progress Notes (Signed)
OT Cancellation Note  Patient Details Name: Curtis White MRN: [id number] DOB: 1921/12/21   Cancelled Treatment:    Reason Eval/Treat Not Completed: Other (comment) (OT screen. Note pt planning SNF. Defer to SNF)  Lennox Laity [phone] 12/13/2012, 1:12 PM

## 2012-12-13 NOTE — Progress Notes (Signed)
[facility] PROGRESS NOTE  Curtis White:[id number] DOB: [birth year] DOA: 12/11/2012 PCP: [name], MD  Assessment/Plan: Principal Problem:   Acute DVT (deep venous thrombosis): Anticoagulation started. Lovenox plus Coumadin    DIABETES MELLITUS, TYPE II, CONTROLLED: CBG starting to trend up. Will monitor closely.    HYPERLIPIDEMIA-MIXED    HYPERTENSION, BENIGN: Blood pressure a little soft. As we titrated up Lasix, have titrated down Lotensin.    Chronic systolic CHF (congestive heart failure): Will check echo for complete thoroughness. Given markedly elevated BNP, and increase Lasix.     Bacterial infection of knee joint: continue IV Unasyn and vancomycin. Status post I&D, arthroscopy & synovectomy. Await cultures  UTI: Discontinue Cipro as IV antibiotics will handle this. Await cultures  Code Status: full code   Consultants:  [facility]  Procedures: Lower Ext dopplers: Done 7/31.  Findings consistent with acute deep vein thrombosis involving popliteal vein of the right lower extremity. - Findings consistent with acute deep vein thrombosis involving the politeal and femoral veins of theleft lower Extremity.   Antibiotics:  IV Unasyn Day 3  IV vancomycin day 3   Objective: Filed Vitals:   12/12/12 1500 12/12/12 2049 12/13/12 0459 12/13/12 1000  BP:  90/44 103/52 97/52  Pulse:  94 63   Temp:  97.8 F (36.6 C) 97.6 F (36.4 C)   Resp:  16 16   Height: 5\' 10"  (1.778 m)     Weight: 68.2 kg (150 lb 5.7 oz)     SpO2:  97% 98%     Intake/Output Summary (Last 24 hours) at 12/13/12 1721 Last data filed at 12/13/12 1101  Gross per 24 hour  Intake    480 ml  Output    150 ml  Net    330 ml   Filed Weights   12/12/12 1100 12/12/12 1500  Weight: 69.854 kg (154 lb) 68.2 kg (150 lb 5.7 oz)    General: Alert and oriented x3, no acute distress  Cardiovascular: Regular rate and rhythm, occasional ectopic beat, 2/6 systolic ejection  murmur  Lungs: Clear to auscultation bilaterally  Abdomen: Soft, nontender, nondistended, positive bowel sounds  Extremities: 1-2+ edema bilaterally below the knee bilaterally swollen  Data Reviewed: Basic Metabolic Panel:  Recent Labs Lab 12/11/12 1419 12/12/12 0435 12/13/12 0545  NA 128* 131* 136  K 3.7 3.4* 3.4*  CL 91* 97 98  CO2 26 26 31   GLUCOSE 105* 51* 81  BUN 22 16 12   CREATININE 0.77 0.51 0.55  CALCIUM 8.1* 7.5* 7.3*   Liver Function Tests:  Recent Labs Lab 12/11/12 1419 12/12/12 0435  AST 12 10  ALT 14 9  ALKPHOS 93 72  BILITOT 0.4 0.3  PROT 6.0 4.8*  ALBUMIN 2.1* 2.0*   CBC:  Recent Labs Lab 12/11/12 1419 12/12/12 0435 12/13/12 0545  WBC 12.5* 10.6* 10.9*  NEUTROABS 11.1*  --   --   HGB 9.4* 8.2* 8.3*  HCT 28.2* 24.0* 25.0*  MCV 75.0* 74.8* 75.8*  PLT 261 249 265   CBG:  Recent Labs Lab 12/12/12 1606 12/12/12 2219 12/13/12 0645 12/13/12 1058 12/13/12 1627  GLUCAP 179* 127* 77 112* 223*    Recent Results (from the past 240 hour(s))  BODY FLUID CULTURE     Status: None   Collection Time    12/11/12  4:46 PM      Result Value Range Status   Specimen Description SYNOVIAL RIGHT KNEE   Final   Special Requests PATIENT ON FOLLOWING UNASYN  FLUID ON SWAB   Final   Gram Stain     Final   Value: ABUNDANT WBC PRESENT, PREDOMINANTLY PMN     RARE GRAM POSITIVE COCCI     IN PAIRS IN CHAINS   Culture STREPTOCOCCUS GROUP G   Final   Report Status PENDING   Incomplete  AFB CULTURE WITH SMEAR     Status: None   Collection Time    12/11/12  4:46 PM      Result Value Range Status   Specimen Description SYNOVIAL RIGHT KNEE   Final   Special Requests PATIENT ON FOLLOWING UNASYN FLUID ON SWAB   Final   ACID FAST SMEAR NO ACID FAST BACILLI SEEN   Final   Culture     Final   Value: CULTURE WILL BE EXAMINED FOR 6 WEEKS BEFORE ISSUING A FINAL REPORT   Report Status PENDING   Incomplete  ANAEROBIC CULTURE     Status: None   Collection Time     12/11/12  4:46 PM      Result Value Range Status   Specimen Description SYNOVIAL RIGHT KNEE   Final   Special Requests PATIENT ON FOLLOWING UNASYN FLUID ON SWAB   Final   Gram Stain     Final   Value: ABUNDANT WBC PRESENT, PREDOMINANTLY PMN     RARE GRAM POSITIVE COCCI     IN PAIRS IN CHAINS   Culture     Final   Value: NO ANAEROBES ISOLATED; CULTURE IN PROGRESS FOR 5 DAYS   Report Status PENDING   Incomplete  URINE CULTURE     Status: None   Collection Time    12/12/12  2:20 AM      Result Value Range Status   Specimen Description URINE, CLEAN CATCH   Final   Special Requests NONE   Final   Culture  Setup Time 12/12/2012 03:06   Final   Colony Count 90,000 COLONIES/ML   Final   Culture PSEUDOMONAS AERUGINOSA   Final   Report Status PENDING   Incomplete     Studies: No results found.  Scheduled Meds: . alfuzosin  10 mg Oral Daily  . ampicillin-sulbactam (UNASYN) IV  3 g Intravenous Q6H  . [START ON 12/14/2012] benazepril  2.5 mg Oral Daily  . coumadin book   Does not apply Once  . docusate sodium  100 mg Oral BID  . enoxaparin (LOVENOX) injection  70 mg Subcutaneous Q12H  . ezetimibe  10 mg Oral QPM   And  . simvastatin  10 mg Oral QPM  . feeding supplement  237 mL Oral BID BM  . finasteride  5 mg Oral Daily  . furosemide  40 mg Intravenous Q12H  . insulin aspart  0-9 Units Subcutaneous TID WC  . metoprolol succinate  50 mg Oral QPC supper  . pantoprazole  40 mg Oral Daily  . [START ON 12/14/2012] potassium chloride  20 mEq Oral Daily  . vancomycin  1,500 mg Intravenous Q24H  . warfarin  5 mg Oral ONCE-1800  . warfarin   Does not apply Once  . Warfarin - Pharmacist Dosing Inpatient   Does not apply q1800   Continuous Infusions: . lactated ringers 75 mL/hr at 12/11/12 1714    Principal Problem:   Acute DVT (deep venous thrombosis) Active Problems:   DIABETES MELLITUS, TYPE II, CONTROLLED   HYPERLIPIDEMIA-MIXED   HYPERTENSION, BENIGN   Chronic systolic CHF  (congestive heart failure)   Bacterial infection of knee joint  Severe malnutrition    Time spent: 20 min    Hollice Espy  [facility] Pager [phone]. If 7PM-7AM, please contact night-coverage at [email or web address], password [facility] 12/13/2012, 5:21 PM  LOS: 2 days

## 2012-12-13 NOTE — Progress Notes (Signed)
ANTICOAGULATION CONSULT NOTE - Follow-up  Pharmacy Consult for warfarin + lovenox Indication: DVT  Allergies  Allergen Reactions  . Atorvastatin Other (See Comments)    Muscle weakness   . Pravastatin Sodium Other (See Comments)    Muscle weakness   . Rosuvastatin Other (See Comments)    Muscle weakness     Patient Measurements: Height: 5\' 10"  (177.8 cm) Weight: 150 lb 5.7 oz (68.2 kg) IBW/kg (Calculated) : 73  Vital Signs: Temp: 97.6 F (36.4 C) (08/01 0459) BP: 103/52 mmHg (08/01 0459) Pulse Rate: 63 (08/01 0459)  Labs:  Recent Labs  12/11/12 1419 12/12/12 0435 12/12/12 1446 12/13/12 0545  HGB 9.4* 8.2*  --  8.3*  HCT 28.2* 24.0*  --  25.0*  PLT 261 249  --  265  LABPROT  --   --  14.1 15.1  INR  --   --  1.11 1.22  CREATININE 0.77 0.51  --  0.55    Estimated Creatinine Clearance: 60.4 ml/min (by C-G formula based on Cr of 0.55).  Assessment: 89 yom s/p L knee arthorscopy and partial synovectomy for acute pyarthrosis now with new bilateral DVT started on warfarin + lovenox bridge. INR is subtherapeutic at 1.22 as anticipated after only 1x dose of warfarin. H/H 8.3/25, plts 265, no bleeding noted.   Goal of Therapy:  INR 2-3 Anti-Xa level 0.6-1.2 units/ml 4hrs after LMWH dose given Monitor platelets by anticoagulation protocol: Yes   Plan:  1. Repeat Warfarin 5mg  PO x 1 tonight 2. F/u AM INR 3. Continue lovenox 70mg  SQ Q12H 5. CBC Q72H while on lovenox  Lysle Pearl, PharmD, BCPS Pager # [phone] 12/13/2012 11:18 AM

## 2012-12-13 NOTE — Clinical Documentation Improvement (Signed)
THIS DOCUMENT IS NOT A PERMANENT PART OF THE MEDICAL RECORD  Please update your documentation with the medical record to reflect your response to this query. If you need help knowing how to do this please call [phone].  12/13/12   To Wende Neighbors, PA-C / Associates,  In a better effort to capture your patient's severity of illness, reflect appropriate length of stay and utilization of resources, a review of the patient medical record has revealed the following indicators.    Based on your clinical judgment, please clarify and document in a progress note and/or discharge summary the clinical condition associated with the following supporting information:  In responding to this query please exercise your independent judgment.  The fact that a query is asked, does not imply that any particular answer is desired or expected.  Dietitian note on 12/12/12 stating patient meets criteria for "severe malnutrition"  , please review note and document nutritional diagnosis if appropriate.  Thank you   Possible Clinical Conditions?  Severe Malnutrition    Protein Calorie Malnutrition  Severe Protein Calorie Malnutrition  Other Condition  Cannot clinically determine    : Risk Factors: s/p Compression Nailing lt hip, acute  DVT, acute popliteal DVT,   Ht 5' 10.08"     Wt 154 lbs  BMI: 22.1  Weight  Loss 14% over  3 months         Nutrition Consult: Severe malnutrition in the context of chronic illness   Recommendation: Ensure Complete po BID, each supplement provides 350 kcal and 13 grams of protein.       You may use possible, probable, or suspect with inpatient documentation. possible, probable, suspected diagnoses MUST be documented at the time of discharge  Reviewed: additional documentation in the medical record  Thank Arrie Eastern  Clinical Documentation Specialist: [phone] Health Information Management [place]

## 2012-12-13 NOTE — Progress Notes (Signed)
Per Dr. [name], patient should maintain SCD's with documented DVT.

## 2012-12-13 NOTE — Progress Notes (Signed)
Orthopedic Tech Progress Note Patient Details:  Curtis White 13-Oct-1921 [id number]  CPM Left Knee CPM Left Knee: On Left Knee Flexion (Degrees): 90 Left Knee Extension (Degrees): 0   Shawnie Pons 12/13/2012, 2:06 PM

## 2012-12-14 DIAGNOSIS — I5023 Acute on chronic systolic (congestive) heart failure: Secondary | ICD-10-CM

## 2012-12-14 DIAGNOSIS — N390 Urinary tract infection, site not specified: Secondary | ICD-10-CM | POA: Diagnosis present

## 2012-12-14 DIAGNOSIS — I059 Rheumatic mitral valve disease, unspecified: Secondary | ICD-10-CM

## 2012-12-14 LAB — BASIC METABOLIC PANEL
CO2: 31 mEq/L (ref 19–32)
Calcium: 7 mg/dL — ABNORMAL LOW (ref 8.4–10.5)
Creatinine, Ser: 0.53 mg/dL (ref 0.50–1.35)
Glucose, Bld: 119 mg/dL — ABNORMAL HIGH (ref 70–99)
Sodium: 134 mEq/L — ABNORMAL LOW (ref 135–145)

## 2012-12-14 LAB — GLUCOSE, CAPILLARY: Glucose-Capillary: 124 mg/dL — ABNORMAL HIGH (ref 70–99)

## 2012-12-14 LAB — BODY FLUID CULTURE

## 2012-12-14 LAB — URINE CULTURE

## 2012-12-14 MED ORDER — DEXTROSE 5 % IV SOLN
1.0000 g | Freq: Two times a day (BID) | INTRAVENOUS | Status: DC
  Administered 2012-12-14 – 2012-12-17 (×7): 1 g via INTRAVENOUS
  Filled 2012-12-14 (×8): qty 1

## 2012-12-14 MED ORDER — CALCIUM CARBONATE 1250 MG/5ML PO SUSP
500.0000 mg | Freq: Three times a day (TID) | ORAL | Status: DC
  Administered 2012-12-14 (×2): 500 mg via ORAL
  Filled 2012-12-14 (×6): qty 5

## 2012-12-14 MED ORDER — WARFARIN SODIUM 5 MG PO TABS
5.0000 mg | ORAL_TABLET | Freq: Once | ORAL | Status: AC
  Administered 2012-12-14: 5 mg via ORAL
  Filled 2012-12-14: qty 1

## 2012-12-14 NOTE — Progress Notes (Signed)
Clinical Social Work Department BRIEF PSYCHOSOCIAL ASSESSMENT 12/14/2012  Patient:  Curtis White, Curtis White     Account Number:  [id number]     Admit date:  12/11/2012  Clinical Social Worker:  Hattie Perch  Date/Time:  12/14/2012 12:00 M  Referred by:  Physician  Date Referred:  12/14/2012 Referred for  SNF Placement   Other Referral:   Interview type:  Patient Other interview type:    PSYCHOSOCIAL DATA Living Status:  ALONE Admitted from facility:   Level of care:   Primary support name:  Jeanett Schlein Primary support relationship to patient:  CHILD, ADULT Degree of support available:   good    CURRENT CONCERNS Current Concerns  Post-Acute Placement   Other Concerns:    SOCIAL WORK ASSESSMENT / PLAN CSW met with patient. patient is alert and oriented X3. patient has just had his third hip surgery. patient states previously he was at [facility] for rehab and he would like to go back there upon discharge. patient states that he wishes he could go home but he guess that [facility] is alright.   Assessment/plan status:   Other assessment/ plan:   Information/referral to community resources:    PATIENT'S/FAMILY'S RESPONSE TO PLAN OF CARE: patient apathetic about rehab but agreeable to [facility] for snf placement.

## 2012-12-14 NOTE — Progress Notes (Signed)
ANTICOAGULATION CONSULT NOTE - Follow-up  Pharmacy Consult for warfarin + lovenox Indication: DVT  Allergies  Allergen Reactions  . Atorvastatin Other (See Comments)    Muscle weakness   . Pravastatin Sodium Other (See Comments)    Muscle weakness   . Rosuvastatin Other (See Comments)    Muscle weakness     Patient Measurements: Height: 5\' 10"  (177.8 cm) Weight: 150 lb 5.7 oz (68.2 kg) IBW/kg (Calculated) : 73  Vital Signs: Temp: 98.1 F (36.7 C) (08/02 0559) Temp src: Oral (08/02 0559) BP: 102/59 mmHg (08/02 0559) Pulse Rate: 83 (08/02 0559)  Labs:  Recent Labs  12/11/12 1419 12/12/12 0435 12/12/12 1446 12/13/12 0545 12/14/12 0520  HGB 9.4* 8.2*  --  8.3*  --   HCT 28.2* 24.0*  --  25.0*  --   PLT 261 249  --  265  --   LABPROT  --   --  14.1 15.1 19.6*  INR  --   --  1.11 1.22 1.71*  CREATININE 0.77 0.51  --  0.55 0.53    Estimated Creatinine Clearance: 60.4 ml/min (by C-G formula based on Cr of 0.53).  Assessment: 89 yom s/p L knee arthorscopy and partial synovectomy for acute pyarthrosis now with new bilateral DVT started on warfarin + lovenox bridge. INR is subtherapeutic at 1.71. H/H 8.3/25, plts 265, no bleeding noted.   Goal of Therapy:  INR 2-3 Anti-Xa level 0.6-1.2 units/ml 4hrs after LMWH dose given Monitor platelets by anticoagulation protocol: Yes   Plan:  1. Repeat Warfarin 5mg  PO x 1 tonight 2. F/u AM INR 3. Continue lovenox 70mg  SQ Q12H 5. CBC Q72H while on lovenox  Lillyanna Glandon A. Lenon Ahmadi, PharmD Clinical Pharmacist - Resident Pager: [phone] Pharmacy: [phone] 12/14/2012 9:12 AM

## 2012-12-14 NOTE — Progress Notes (Signed)
Subjective: Pt having left knee pain   Objective: Vital signs in last 24 hours: Temp:  [97.9 F (36.6 C)-98.1 F (36.7 C)] 98.1 F (36.7 C) (08/02 0559) Pulse Rate:  [83-96] 83 (08/02 0559) Resp:  [16] 16 (08/02 0559) BP: (97-102)/(46-59) 102/59 mmHg (08/02 0559) SpO2:  [97 %-99 %] 97 % (08/02 0559)  Intake/Output from previous day: 08/01 0701 - 08/02 0700 In: 1200 [P.O.:960; I.V.:240] Out: 975 [Urine:975] Intake/Output this shift: Total I/O In: -  Out: 725 [Urine:725]  Exam:  Dorsiflexion/Plantar flexion intact  Labs:  Recent Labs  12/11/12 1419 12/12/12 0435 12/13/12 0545  HGB 9.4* 8.2* 8.3*    Recent Labs  12/12/12 0435 12/13/12 0545  WBC 10.6* 10.9*  RBC 3.21* 3.30*  HCT 24.0* 25.0*  PLT 249 265    Recent Labs  12/13/12 0545 12/14/12 0520  NA 136 134*  K 3.4* 4.0  CL 98 96  CO2 31 31  BUN 12 11  CREATININE 0.55 0.53  GLUCOSE 81 119*  CALCIUM 7.3* 7.0*    Recent Labs  12/13/12 0545 12/14/12 0520  INR 1.22 1.71*    Assessment/Plan: Pt stable - needs calcium - cont cpm   DEAN,GREGORY SCOTT 12/14/2012, 9:01 AM

## 2012-12-14 NOTE — Progress Notes (Addendum)
Clinical Social Work Department CLINICAL SOCIAL WORK PLACEMENT NOTE 12/14/2012  Patient:  Curtis White, Curtis White  Account Number:  [id number] Admit date:  12/11/2012  Clinical Social Worker:  Becky Sax, LCSW  Date/time:  12/14/2012 12:00 M  Clinical Social Work is seeking post-discharge placement for this patient at the following level of care:   SKILLED NURSING   (*CSW will update this form in Epic as items are completed)   12/14/2012  Patient/family provided with [facility] Department of Clinical Social Work's list of facilities offering this level of care within the geographic area requested by the patient (or if unable, by the patient's family).  12/14/2012  Patient/family informed of their freedom to choose among providers that offer the needed level of care, that participate in Medicare, Medicaid or managed care program needed by the patient, have an available bed and are willing to accept the patient.  12/14/2012  Patient/family informed of [facility]' ownership interest in [facility], as well as of the fact that they are under no obligation to receive care at this facility.  PASARR submitted to EDS on 12/14/2012 PASARR number received from EDS on 12/14/2012  FL2 transmitted to all facilities in geographic area requested by pt/family on  12/14/2012 FL2 transmitted to all facilities within larger geographic area on   Patient informed that his/her managed care company has contracts with or will negotiate with  certain facilities, including the following:     Patient/family informed of bed offers received:   Patient chooses bed at [facility] Physician recommends and patient chooses bed at    Patient to be transferred to  on  12/17/2012 Patient to be transferred to facility by [facility]  The following physician request were entered in Epic:   Additional Comments:

## 2012-12-14 NOTE — Progress Notes (Signed)
  Echocardiogram 2D Echocardiogram has been performed.  Georgian Co 12/14/2012, 5:18 PM

## 2012-12-14 NOTE — Progress Notes (Signed)
ANTIBIOTIC CONSULT NOTE - INITIAL  Pharmacy Consult for Cefepime/Vancomycin Indication: UTI (Pseudomonas)/septic knee  Allergies  Allergen Reactions  . Atorvastatin Other (See Comments)    Muscle weakness   . Pravastatin Sodium Other (See Comments)    Muscle weakness   . Rosuvastatin Other (See Comments)    Muscle weakness     Patient Measurements: Height: 5\' 10"  (177.8 cm) Weight: 150 lb 5.7 oz (68.2 kg) IBW/kg (Calculated) : 73   Vital Signs: Temp: 98.1 F (36.7 C) (08/02 0559) Temp src: Oral (08/02 0559) BP: 102/59 mmHg (08/02 0559) Pulse Rate: 83 (08/02 0559) Intake/Output from previous day: 08/01 0701 - 08/02 0700 In: 1200 [P.O.:960; I.V.:240] Out: 975 [Urine:975] Intake/Output from this shift: Total I/O In: 120 [P.O.:120] Out: 725 [Urine:725]  Labs:  Recent Labs  12/11/12 1419 12/12/12 0435 12/13/12 0545 12/14/12 0520  WBC 12.5* 10.6* 10.9*  --   HGB 9.4* 8.2* 8.3*  --   PLT 261 249 265  --   CREATININE 0.77 0.51 0.55 0.53   Estimated Creatinine Clearance: 60.4 ml/min (by C-G formula based on Cr of 0.53). No results found for this basename: VANCOTROUGH, VANCOPEAK, VANCORANDOM, GENTTROUGH, GENTPEAK, GENTRANDOM, TOBRATROUGH, TOBRAPEAK, TOBRARND, AMIKACINPEAK, AMIKACINTROU, AMIKACIN,  in the last 72 hours   Microbiology: Recent Results (from the past 720 hour(s))  BODY FLUID CULTURE     Status: None   Collection Time    12/11/12  4:46 PM      Result Value Range Status   Specimen Description SYNOVIAL RIGHT KNEE   Final   Special Requests PATIENT ON FOLLOWING UNASYN FLUID ON SWAB   Final   Gram Stain     Final   Value: ABUNDANT WBC PRESENT, PREDOMINANTLY PMN     RARE GRAM POSITIVE COCCI     IN PAIRS IN CHAINS   Culture STREPTOCOCCUS GROUP G   Final   Report Status PENDING   Incomplete  AFB CULTURE WITH SMEAR     Status: None   Collection Time    12/11/12  4:46 PM      Result Value Range Status   Specimen Description SYNOVIAL RIGHT KNEE   Final   Special Requests PATIENT ON FOLLOWING UNASYN FLUID ON SWAB   Final   ACID FAST SMEAR NO ACID FAST BACILLI SEEN   Final   Culture     Final   Value: CULTURE WILL BE EXAMINED FOR 6 WEEKS BEFORE ISSUING A FINAL REPORT   Report Status PENDING   Incomplete  ANAEROBIC CULTURE     Status: None   Collection Time    12/11/12  4:46 PM      Result Value Range Status   Specimen Description SYNOVIAL RIGHT KNEE   Final   Special Requests PATIENT ON FOLLOWING UNASYN FLUID ON SWAB   Final   Gram Stain     Final   Value: ABUNDANT WBC PRESENT, PREDOMINANTLY PMN     RARE GRAM POSITIVE COCCI     IN PAIRS IN CHAINS   Culture     Final   Value: NO ANAEROBES ISOLATED; CULTURE IN PROGRESS FOR 5 DAYS   Report Status PENDING   Incomplete  URINE CULTURE     Status: None   Collection Time    12/12/12  2:20 AM      Result Value Range Status   Specimen Description URINE, CLEAN CATCH   Final   Special Requests NONE   Final   Culture  Setup Time 12/12/2012 03:06   Final  Colony Count 90,000 COLONIES/ML   Final   Culture PSEUDOMONAS AERUGINOSA   Final   Report Status 12/14/2012 FINAL   Final   Organism ID, Bacteria PSEUDOMONAS AERUGINOSA   Final    Medical History: Past Medical History  Diagnosis Date  . Mixed hyperlipidemia   . Benign essential HTN   . CAD (coronary artery disease)     a. CABG 1983. b. Re-do CABG 1997. c. Grafts patent 2003.  Marland Kitchen Hyperlipidemia   . Skin rash     of uncertain etiology  . Prostate enlargement     "epididymysis"  . CHF (congestive heart failure)   . Type II diabetes mellitus     controlled  . Shortness of breath     "before first heart surgery" (08/16/2012)  . GERD (gastroesophageal reflux disease)   . Hepatitis     "[employer] quit taking my blood cause they say I had this" (08/16/2012)  . Arthritis     "some, in my calves/legs I guess"  (08/16/2012)    Assessment: 79 y/o M with L knee pain x 2 weeks. Had intr-articular joint injection 7/21 for OA pain. Patient was  started on vancomycin for septic knee, and the dose will need to be monitored closely for renal function. Urine culture sensitivities for pseudomonas came back as pan-sensitive. Pharmacy was consulted to dose cefepime.   Goal of Therapy:  Eradicate infection Vancomycin Trough level 10-61mcg/ml  Plan:  - Start cefepime 1g q12hrs - Continue vancomycin 1500mg  IV Q24H - low-threshold for dose adjustment if renal fxn changes - F/u renal fxn, clinical status and trough at SS (8/3 @ 1100)  Robet Crutchfield A. Lenon Ahmadi, PharmD Clinical Pharmacist - Resident Pager: [phone] Pharmacy: [phone] 12/14/2012 9:20 AM

## 2012-12-14 NOTE — Progress Notes (Signed)
[facility] PROGRESS NOTE  Curtis White GNF:[id number] DOB: Dec 08, 1921 DOA: 12/11/2012 PCP: [name], MD  Assessment/Plan: Principal Problem:   Acute DVT (deep venous thrombosis): Anticoagulation started. Lovenox plus Coumadin    DIABETES MELLITUS, TYPE II, CONTROLLED: CBG better controlled today    HYPERLIPIDEMIA-MIXED    HYPERTENSION, BENIGN: Blood pressure a little soft. As we titrated up Lasix, have titrated down Lotensin.    Acute on Chronic systolic CHF (congestive heart failure): Will check echo for complete thoroughness. Given markedly elevated BNP, and increase Lasix. Currently -0.6 L    Bacterial infection of knee joint: Cultures grew out strep group G. cefepime should cover this as well  UTI: Urine grew out Pseudomonas. Changed from Unasyn to cefepime  Code Status: full code   Consultants:  [facility]  Procedures: Lower Ext dopplers: Done 7/31.  Findings consistent with acute deep vein thrombosis involving popliteal vein of the right lower extremity. - Findings consistent with acute deep vein thrombosis involving the politeal and femoral veins of theleft lower Extremity.   Antibiotics:  IV Unasyn Day 3 stop it/2  IV vancomycin day 3 stopped 8/2  IV cefepime started 8/1   Objective: Filed Vitals:   12/13/12 1000 12/13/12 1800 12/13/12 2134 12/14/12 0559  BP: 97/52 98/52 99/46  102/59  Pulse:   96 83  Temp:   97.9 F (36.6 C) 98.1 F (36.7 C)  TempSrc:   Oral Oral  Resp:   16 16  Height:      Weight:      SpO2:   99% 97%    Intake/Output Summary (Last 24 hours) at 12/14/12 0733 Last data filed at 12/14/12 0559  Gross per 24 hour  Intake    600 ml  Output    750 ml  Net   -150 ml   Filed Weights   12/12/12 1100 12/12/12 1500  Weight: 69.854 kg (154 lb) 68.2 kg (150 lb 5.7 oz)    General: Alert and oriented x3, no acute distress  Cardiovascular: Irregular rhythm, rate controlled, 2/6 systolic ejection  murmur  Lungs: Clear to auscultation bilaterally  Abdomen: Soft, nontender, nondistended, positive bowel sounds  Extremities: 1-2+ edema bilaterally below the knee bilaterally swollen  Data Reviewed: Basic Metabolic Panel:  Recent Labs Lab 12/11/12 1419 12/12/12 0435 12/13/12 0545 12/14/12 0520  NA 128* 131* 136 134*  K 3.7 3.4* 3.4* 4.0  CL 91* 97 98 96  CO2 26 26 31 31   GLUCOSE 105* 51* 81 119*  BUN 22 16 12 11   CREATININE 0.77 0.51 0.55 0.53  CALCIUM 8.1* 7.5* 7.3* 7.0*   Liver Function Tests:  Recent Labs Lab 12/11/12 1419 12/12/12 0435  AST 12 10  ALT 14 9  ALKPHOS 93 72  BILITOT 0.4 0.3  PROT 6.0 4.8*  ALBUMIN 2.1* 2.0*   CBC:  Recent Labs Lab 12/11/12 1419 12/12/12 0435 12/13/12 0545  WBC 12.5* 10.6* 10.9*  NEUTROABS 11.1*  --   --   HGB 9.4* 8.2* 8.3*  HCT 28.2* 24.0* 25.0*  MCV 75.0* 74.8* 75.8*  PLT 261 249 265   CBG:  Recent Labs Lab 12/13/12 0645 12/13/12 1058 12/13/12 1627 12/13/12 2125 12/14/12 0623  GLUCAP 77 112* 223* 115* 123*    Recent Results (from the past 240 hour(s))  BODY FLUID CULTURE     Status: None   Collection Time    12/11/12  4:46 PM      Result Value Range Status   Specimen Description SYNOVIAL RIGHT KNEE  Final   Special Requests PATIENT ON FOLLOWING UNASYN FLUID ON SWAB   Final   Gram Stain     Final   Value: ABUNDANT WBC PRESENT, PREDOMINANTLY PMN     RARE GRAM POSITIVE COCCI     IN PAIRS IN CHAINS   Culture STREPTOCOCCUS GROUP G   Final   Report Status PENDING   Incomplete  AFB CULTURE WITH SMEAR     Status: None   Collection Time    12/11/12  4:46 PM      Result Value Range Status   Specimen Description SYNOVIAL RIGHT KNEE   Final   Special Requests PATIENT ON FOLLOWING UNASYN FLUID ON SWAB   Final   ACID FAST SMEAR NO ACID FAST BACILLI SEEN   Final   Culture     Final   Value: CULTURE WILL BE EXAMINED FOR 6 WEEKS BEFORE ISSUING A FINAL REPORT   Report Status PENDING   Incomplete  ANAEROBIC  CULTURE     Status: None   Collection Time    12/11/12  4:46 PM      Result Value Range Status   Specimen Description SYNOVIAL RIGHT KNEE   Final   Special Requests PATIENT ON FOLLOWING UNASYN FLUID ON SWAB   Final   Gram Stain     Final   Value: ABUNDANT WBC PRESENT, PREDOMINANTLY PMN     RARE GRAM POSITIVE COCCI     IN PAIRS IN CHAINS   Culture     Final   Value: NO ANAEROBES ISOLATED; CULTURE IN PROGRESS FOR 5 DAYS   Report Status PENDING   Incomplete  URINE CULTURE     Status: None   Collection Time    12/12/12  2:20 AM      Result Value Range Status   Specimen Description URINE, CLEAN CATCH   Final   Special Requests NONE   Final   Culture  Setup Time 12/12/2012 03:06   Final   Colony Count 90,000 COLONIES/ML   Final   Culture PSEUDOMONAS AERUGINOSA   Final   Report Status 12/14/2012 FINAL   Final   Organism ID, Bacteria PSEUDOMONAS AERUGINOSA   Final     Studies: No results found.  Scheduled Meds: . alfuzosin  10 mg Oral Daily  . ampicillin-sulbactam (UNASYN) IV  3 g Intravenous Q6H  . benazepril  2.5 mg Oral Daily  . coumadin book   Does not apply Once  . docusate sodium  100 mg Oral BID  . enoxaparin (LOVENOX) injection  70 mg Subcutaneous Q12H  . ezetimibe  10 mg Oral QPM   And  . simvastatin  10 mg Oral QPM  . feeding supplement  237 mL Oral BID BM  . finasteride  5 mg Oral Daily  . furosemide  40 mg Intravenous Q12H  . insulin aspart  0-9 Units Subcutaneous TID WC  . metoprolol succinate  50 mg Oral QPC supper  . pantoprazole  40 mg Oral Daily  . potassium chloride  20 mEq Oral Daily  . vancomycin  1,500 mg Intravenous Q24H  . warfarin   Does not apply Once  . Warfarin - Pharmacist Dosing Inpatient   Does not apply q1800   Continuous Infusions: . lactated ringers 75 mL/hr at 12/11/12 1714    Principal Problem:   Acute DVT (deep venous thrombosis) Active Problems:   DIABETES MELLITUS, TYPE II, CONTROLLED   HYPERLIPIDEMIA-MIXED   HYPERTENSION,  BENIGN   Bacterial infection of knee joint  Severe malnutrition   Acute on chronic systolic heart failure    Time spent: 20 min    Hollice Espy  [facility] Pager [phone]. If 7PM-7AM, please contact night-coverage at [email or web address], password [facility] 12/14/2012, 7:33 AM  LOS: 3 days

## 2012-12-15 DIAGNOSIS — N390 Urinary tract infection, site not specified: Secondary | ICD-10-CM

## 2012-12-15 DIAGNOSIS — E43 Unspecified severe protein-calorie malnutrition: Secondary | ICD-10-CM | POA: Diagnosis present

## 2012-12-15 LAB — PROTIME-INR: Prothrombin Time: 28.9 seconds — ABNORMAL HIGH (ref 11.6–15.2)

## 2012-12-15 LAB — GLUCOSE, CAPILLARY: Glucose-Capillary: 237 mg/dL — ABNORMAL HIGH (ref 70–99)

## 2012-12-15 MED ORDER — CALCIUM CARBONATE 1250 MG/5ML PO SUSP
1000.0000 mg | Freq: Three times a day (TID) | ORAL | Status: DC
  Administered 2012-12-15: 1000 mg via ORAL
  Filled 2012-12-15 (×3): qty 10

## 2012-12-15 NOTE — Progress Notes (Signed)
Subjective: Pt stable - in cpm   Objective: Vital signs in last 24 hours: Temp:  [97.4 F (36.3 C)-97.8 F (36.6 C)] 97.4 F (36.3 C) (08/03 0558) Pulse Rate:  [82-107] 87 (08/03 0558) Resp:  [16-20] 20 (08/03 0558) BP: (95-105)/(51-63) 95/55 mmHg (08/03 0558) SpO2:  [96 %-98 %] 98 % (08/03 0558)  Intake/Output from previous day: 08/02 0701 - 08/03 0700 In: 600 [P.O.:600] Out: 2225 [Urine:2225] Intake/Output this shift: Total I/O In: 240 [P.O.:240] Out: 150 [Urine:150]  Exam:  Sensation intact distally Intact pulses distally  Labs:  Recent Labs  12/13/12 0545  HGB 8.3*    Recent Labs  12/13/12 0545  WBC 10.9*  RBC 3.30*  HCT 25.0*  PLT 265    Recent Labs  12/13/12 0545 12/14/12 0520  NA 136 134*  K 3.4* 4.0  CL 98 96  CO2 31 31  BUN 12 11  CREATININE 0.55 0.53  GLUCOSE 81 119*  CALCIUM 7.3* 7.0*    Recent Labs  12/14/12 0520 12/15/12 0508  INR 1.71* 2.85*    Assessment/Plan: Continue to mobilize - inr ok - ca still low   Asti Mackley SCOTT 12/15/2012, 8:06 AM

## 2012-12-15 NOTE — Progress Notes (Signed)
ANTICOAGULATION CONSULT NOTE - Follow-up  Pharmacy Consult for warfarin + lovenox Indication: DVT  Allergies  Allergen Reactions  . Atorvastatin Other (See Comments)    Muscle weakness   . Pravastatin Sodium Other (See Comments)    Muscle weakness   . Rosuvastatin Other (See Comments)    Muscle weakness     Patient Measurements: Height: 5\' 10"  (177.8 cm) Weight: 150 lb 5.7 oz (68.2 kg) IBW/kg (Calculated) : 73  Vital Signs: Temp: 97.4 F (36.3 C) (08/03 0558) Temp src: Oral (08/03 0558) BP: 95/55 mmHg (08/03 0558) Pulse Rate: 87 (08/03 0558)  Labs:  Recent Labs  12/13/12 0545 12/14/12 0520 12/15/12 0508  HGB 8.3*  --   --   HCT 25.0*  --   --   PLT 265  --   --   LABPROT 15.1 19.6* 28.9*  INR 1.22 1.71* 2.85*  CREATININE 0.55 0.53  --     Estimated Creatinine Clearance: 60.4 ml/min (by C-G formula based on Cr of 0.53).  Assessment: 89 yom s/p L knee arthorscopy and partial synovectomy for acute pyarthrosis now with new bilateral DVT started on warfarin + lovenox bridge. INR is therapeutic at 2.85 but it increased significantly. H/H 8.3/25, plts 265, no bleeding noted.   Goal of Therapy:  INR 2-3 Anti-Xa level 0.6-1.2 units/ml 4hrs after LMWH dose given Monitor platelets by anticoagulation protocol: Yes   Plan:  1. Hold warfarin dose on 8/3 2. F/u AM INR 3. Continue lovenox 70mg  SQ Q12H for minimum of 5 days and 2 consecutive INR readings within goal range per CHEST guidelines 5. CBC Q72H while on lovenox  Revia Nghiem A. Lenon Ahmadi, PharmD Clinical Pharmacist - Resident Pager: [phone] Pharmacy: [phone] 12/15/2012 8:45 AM

## 2012-12-15 NOTE — Progress Notes (Addendum)
[facility] PROGRESS NOTE  Curtis White ZOX:[id number] DOB: [birth year] DOA: 12/11/2012 PCP: [name], MD  Assessment/Plan: Principal Problem:   Acute DVT (deep venous thrombosis): Anticoagulation started. Lovenox plus Coumadin    DIABETES MELLITUS, TYPE II, CONTROLLED: CBG better controlled today    HYPERLIPIDEMIA-MIXED    HYPERTENSION, BENIGN: Blood pressure a little soft. As we titrated up Lasix, have titrated down Lotensin.    Acute on Chronic systolic CHF (congestive heart failure): Will check echo for complete thoroughness. Given markedly elevated BNP, and increase Lasix. Diuresed 2.1 L    Bacterial infection of knee joint: Cultures grew out strep group G. cefepime should cover this as well  Severe protein calorie malnutrition: BMI 22. In the setting of chronic illness. Appreciate nutrition help. Patient on Ensure twice a day  UTI: Urine grew out Pseudomonas. Changed from Unasyn to cefepime  Code Status: full code   Consultants:  [facility]  Procedures: Lower Ext dopplers: Done 7/31.  Findings consistent with acute deep vein thrombosis involving popliteal vein of the right lower extremity. - Findings consistent with acute deep vein thrombosis involving the politeal and femoral veins of theleft lower Extremity.   Antibiotics:  IV Unasyn Day 3 stopped on 8/1  IV vancomycin day 3 stopped 8/2  IV cefepime started 8/1   Objective: Filed Vitals:   12/14/12 1100 12/14/12 1734 12/14/12 2200 12/15/12 0558  BP: 105/53 103/51 104/63 95/55  Pulse: 106 107 106 87  Temp: 97.8 F (36.6 C)  97.6 F (36.4 C) 97.4 F (36.3 C)  TempSrc:   Oral Oral  Resp: 16  19 20   Height:      Weight:      SpO2: 98%  96% 98%    Intake/Output Summary (Last 24 hours) at 12/15/12 1138 Last data filed at 12/15/12 0933  Gross per 24 hour  Intake    720 ml  Output   2225 ml  Net  -1505 ml   Filed Weights   12/12/12 1100 12/12/12 1500  Weight: 69.854  kg (154 lb) 68.2 kg (150 lb 5.7 oz)    General: Resting comfortably, no acute distress  Cardiovascular: Irregular rhythm, rate controlled, 2/6 systolic ejection murmur  Lungs: Clear to auscultation bilaterally  Abdomen: Soft, nontender, nondistended, positive bowel sounds  Extremities: 1-2+ edema bilaterally below the knee bilaterally swollen  Data Reviewed: Basic Metabolic Panel:  Recent Labs Lab 12/11/12 1419 12/12/12 0435 12/13/12 0545 12/14/12 0520  NA 128* 131* 136 134*  K 3.7 3.4* 3.4* 4.0  CL 91* 97 98 96  CO2 26 26 31 31   GLUCOSE 105* 51* 81 119*  BUN 22 16 12 11   CREATININE 0.77 0.51 0.55 0.53  CALCIUM 8.1* 7.5* 7.3* 7.0*   Liver Function Tests:  Recent Labs Lab 12/11/12 1419 12/12/12 0435  AST 12 10  ALT 14 9  ALKPHOS 93 72  BILITOT 0.4 0.3  PROT 6.0 4.8*  ALBUMIN 2.1* 2.0*   CBC:  Recent Labs Lab 12/11/12 1419 12/12/12 0435 12/13/12 0545  WBC 12.5* 10.6* 10.9*  NEUTROABS 11.1*  --   --   HGB 9.4* 8.2* 8.3*  HCT 28.2* 24.0* 25.0*  MCV 75.0* 74.8* 75.8*  PLT 261 249 265   CBG:  Recent Labs Lab 12/14/12 1057 12/14/12 1626 12/14/12 2204 12/15/12 0641 12/15/12 1057  GLUCAP 141* 241* 124* 149* 199*    Recent Results (from the past 240 hour(s))  BODY FLUID CULTURE     Status: None   Collection Time  12/11/12  4:46 PM      Result Value Range Status   Specimen Description SYNOVIAL RIGHT KNEE   Final   Special Requests PATIENT ON FOLLOWING UNASYN FLUID ON SWAB   Final   Gram Stain     Final   Value: ABUNDANT WBC PRESENT, PREDOMINANTLY PMN     RARE GRAM POSITIVE COCCI     IN PAIRS IN CHAINS   Culture     Final   Value: ABUNDANT STREPTOCOCCUS GROUP G     Note: SUSCEPTIBILITIES PERFORMED ON PREVIOUS CULTURE WITHIN THE LAST 5 DAYS.   Report Status 12/14/2012 FINAL   Final  AFB CULTURE WITH SMEAR     Status: None   Collection Time    12/11/12  4:46 PM      Result Value Range Status   Specimen Description SYNOVIAL RIGHT KNEE    Final   Special Requests PATIENT ON FOLLOWING UNASYN FLUID ON SWAB   Final   ACID FAST SMEAR NO ACID FAST BACILLI SEEN   Final   Culture     Final   Value: CULTURE WILL BE EXAMINED FOR 6 WEEKS BEFORE ISSUING A FINAL REPORT   Report Status PENDING   Incomplete  ANAEROBIC CULTURE     Status: None   Collection Time    12/11/12  4:46 PM      Result Value Range Status   Specimen Description SYNOVIAL RIGHT KNEE   Final   Special Requests PATIENT ON FOLLOWING UNASYN FLUID ON SWAB   Final   Gram Stain     Final   Value: ABUNDANT WBC PRESENT, PREDOMINANTLY PMN     RARE GRAM POSITIVE COCCI     IN PAIRS IN CHAINS   Culture     Final   Value: NO ANAEROBES ISOLATED; CULTURE IN PROGRESS FOR 5 DAYS   Report Status PENDING   Incomplete  URINE CULTURE     Status: None   Collection Time    12/12/12  2:20 AM      Result Value Range Status   Specimen Description URINE, CLEAN CATCH   Final   Special Requests NONE   Final   Culture  Setup Time 12/12/2012 03:06   Final   Colony Count 90,000 COLONIES/ML   Final   Culture PSEUDOMONAS AERUGINOSA   Final   Report Status 12/14/2012 FINAL   Final   Organism ID, Bacteria PSEUDOMONAS AERUGINOSA   Final     Studies: No results found.  Scheduled Meds: . alfuzosin  10 mg Oral Daily  . benazepril  2.5 mg Oral Daily  . calcium carbonate (dosed in mg elemental calcium)  1,000 mg of elemental calcium Oral TID WC  . ceFEPime (MAXIPIME) IV  1 g Intravenous Q12H  . coumadin book   Does not apply Once  . docusate sodium  100 mg Oral BID  . enoxaparin (LOVENOX) injection  70 mg Subcutaneous Q12H  . ezetimibe  10 mg Oral QPM   And  . simvastatin  10 mg Oral QPM  . feeding supplement  237 mL Oral BID BM  . finasteride  5 mg Oral Daily  . furosemide  40 mg Intravenous Q12H  . insulin aspart  0-9 Units Subcutaneous TID WC  . metoprolol succinate  50 mg Oral QPC supper  . pantoprazole  40 mg Oral Daily  . potassium chloride  20 mEq Oral Daily  . warfarin   Does  not apply Once  . Warfarin - Pharmacist Dosing Inpatient  Does not apply q1800   Continuous Infusions:    Principal Problem:   Acute DVT (deep venous thrombosis) Active Problems:   DIABETES MELLITUS, TYPE II, CONTROLLED   HYPERLIPIDEMIA-MIXED   HYPERTENSION, BENIGN   Bacterial infection of knee joint   Severe malnutrition   Acute on chronic systolic heart failure   UTI (urinary tract infection)    Time spent: 20 min    Hollice Espy  [facility] Pager [phone]. If 7PM-7AM, please contact night-coverage at [email or web address], password [facility] 12/15/2012, 11:38 AM  LOS: 4 days

## 2012-12-16 LAB — ALBUMIN: Albumin: 1.8 g/dL — ABNORMAL LOW (ref 3.5–5.2)

## 2012-12-16 LAB — BASIC METABOLIC PANEL
Calcium: 7.2 mg/dL — ABNORMAL LOW (ref 8.4–10.5)
GFR calc non Af Amer: 86 mL/min — ABNORMAL LOW (ref >90)
Sodium: 132 mEq/L — ABNORMAL LOW (ref 135–145)

## 2012-12-16 LAB — CBC
MCH: 25 pg — ABNORMAL LOW (ref 26.0–34.0)
MCHC: 32.5 g/dL (ref 30.0–36.0)
Platelets: 255 10*3/uL (ref 150–400)
RBC: 3.48 MIL/uL — ABNORMAL LOW (ref 4.22–5.81)

## 2012-12-16 LAB — PRO B NATRIURETIC PEPTIDE: Pro B Natriuretic peptide (BNP): 4292 pg/mL — ABNORMAL HIGH (ref 0–450)

## 2012-12-16 LAB — ANAEROBIC CULTURE

## 2012-12-16 LAB — GLUCOSE, CAPILLARY: Glucose-Capillary: 153 mg/dL — ABNORMAL HIGH (ref 70–99)

## 2012-12-16 LAB — PROTIME-INR: Prothrombin Time: 28.5 seconds — ABNORMAL HIGH (ref 11.6–15.2)

## 2012-12-16 MED ORDER — INSULIN ASPART 100 UNIT/ML ~~LOC~~ SOLN
0.0000 [IU] | Freq: Three times a day (TID) | SUBCUTANEOUS | Status: DC
  Administered 2012-12-16: 3 [IU] via SUBCUTANEOUS
  Administered 2012-12-16: 5 [IU] via SUBCUTANEOUS
  Administered 2012-12-16: 2 [IU] via SUBCUTANEOUS
  Administered 2012-12-17: 5 [IU] via SUBCUTANEOUS
  Administered 2012-12-17: 3 [IU] via SUBCUTANEOUS

## 2012-12-16 MED ORDER — INSULIN ASPART 100 UNIT/ML ~~LOC~~ SOLN: 0.0000 [IU] | Freq: Every day | SUBCUTANEOUS | Status: DC

## 2012-12-16 MED ORDER — DEXTROSE 5 % IV SOLN: 1.0000 g | Freq: Two times a day (BID) | INTRAVENOUS | Status: DC

## 2012-12-16 MED ORDER — PANTOPRAZOLE SODIUM 40 MG PO TBEC: 40.0000 mg | DELAYED_RELEASE_TABLET | Freq: Every day | ORAL | Status: DC

## 2012-12-16 MED ORDER — FUROSEMIDE 10 MG/ML IJ SOLN: 40.0000 mg | Freq: Two times a day (BID) | INTRAMUSCULAR | Status: DC

## 2012-12-16 MED ORDER — INSULIN ASPART 100 UNIT/ML ~~LOC~~ SOLN: 0.0000 [IU] | Freq: Three times a day (TID) | SUBCUTANEOUS | Status: DC

## 2012-12-16 MED ORDER — METHOCARBAMOL 500 MG PO TABS: 500.0000 mg | ORAL_TABLET | Freq: Four times a day (QID) | ORAL | Status: DC | PRN

## 2012-12-16 MED ORDER — ENSURE COMPLETE PO LIQD: 237.0000 mL | Freq: Two times a day (BID) | ORAL | Status: DC

## 2012-12-16 MED ORDER — SODIUM CHLORIDE 0.9 % IJ SOLN
10.0000 mL | INTRAMUSCULAR | Status: DC | PRN
  Administered 2012-12-17 (×2): 10 mL

## 2012-12-16 MED ORDER — GLIMEPIRIDE 4 MG PO TABS
4.0000 mg | ORAL_TABLET | Freq: Every day | ORAL | Status: DC
  Administered 2012-12-16 – 2012-12-17 (×2): 4 mg via ORAL
  Filled 2012-12-16 (×2): qty 1

## 2012-12-16 MED ORDER — WARFARIN SODIUM 2.5 MG PO TABS
2.5000 mg | ORAL_TABLET | Freq: Once | ORAL | Status: AC
  Administered 2012-12-16: 2.5 mg via ORAL
  Filled 2012-12-16: qty 1

## 2012-12-16 MED ORDER — POTASSIUM CHLORIDE CRYS ER 20 MEQ PO TBCR: 20.0000 meq | EXTENDED_RELEASE_TABLET | Freq: Every day | ORAL | Status: DC

## 2012-12-16 MED ORDER — HYDROCODONE-ACETAMINOPHEN 5-325 MG PO TABS: 1.0000 | ORAL_TABLET | ORAL | Status: DC | PRN

## 2012-12-16 MED ORDER — WARFARIN SODIUM 2.5 MG PO TABS: 2.5000 mg | ORAL_TABLET | Freq: Every day | ORAL | Status: DC

## 2012-12-16 NOTE — Discharge Summary (Signed)
Physician Discharge Summary  Patient ID: Curtis White MRN: [id number] DOB/AGE: July 26, 1921 79 y.o.  Admit date: 12/11/2012 Discharge date: 12/16/2012  Admission Diagnoses:  Acute DVT (deep venous thrombosis) bilateral LEs Bacterial infection of left knee joint  Discharge Diagnoses:  Principal Problem:   Acute DVT (deep venous thrombosis) Active Problems:   Bacterial infection of knee joint   Severe malnutrition   DIABETES MELLITUS, TYPE II, CONTROLLED   HYPERLIPIDEMIA-MIXED   HYPERTENSION, BENIGN   Acute on chronic systolic heart failure   UTI (urinary tract infection)   Protein-calorie malnutrition, severe   Past Medical History  Diagnosis Date  . Mixed hyperlipidemia   . Benign essential HTN   . CAD (coronary artery disease)     a. CABG 1983. b. Re-do CABG 1997. c. Grafts patent 2003.  Marland Kitchen Hyperlipidemia   . Skin rash     of uncertain etiology  . Prostate enlargement     "epididymysis"  . CHF (congestive heart failure)   . Type II diabetes mellitus     controlled  . Shortness of breath     "before first heart surgery" (08/16/2012)  . GERD (gastroesophageal reflux disease)   . Hepatitis     "[employer] quit taking my blood cause they say I had this" (08/16/2012)  . Arthritis     "some, in my calves/legs I guess"  (08/16/2012)    Surgeries: Procedure(s): IRRIGATION AND DEBRIDEMENT EXTREMITY ARTHROSCOPY KNEE SYNOVECTOMY on 12/11/2012   Consultants (if any): Treatment Team:  Mahala Menghini, MD  Discharged Condition: Improved  Hospital Course: SCHNEUR CROWSON is an 79 y.o. male who was admitted 12/11/2012 with a diagnosis of bacterial infection of the left knee and went to the operating room on 12/11/2012 and underwent the above named procedures.  Aspiration of knee performed in the office prior to admission with specimen sent to lab from the office.  Pt admitted for arthroscopic wash out of knee.    He was given perioperative antibiotics:  Anti-infectives   Start      Dose/Rate Route Frequency Ordered Stop   12/16/12 0000  dextrose 5 % SOLN 50 mL with ceFEPIme 1 G SOLR 1 g     1 g 100 mL/hr over 30 Minutes Intravenous Every 12 hours 12/16/12 1633     12/14/12 1100  ceFEPIme (MAXIPIME) 1 g in dextrose 5 % 50 mL IVPB     1 g 100 mL/hr over 30 Minutes Intravenous Every 12 hours 12/14/12 0929     12/13/12 1500  ciprofloxacin (CIPRO) tablet 500 mg  Status:  Discontinued     500 mg Oral 2 times daily 12/13/12 1404 12/13/12 1720   12/12/12 1100  vancomycin (VANCOCIN) 1,500 mg in sodium chloride 0.9 % 500 mL IVPB  Status:  Discontinued     1,500 mg 250 mL/hr over 120 Minutes Intravenous Every 24 hours 12/12/12 1030 12/14/12 1143   12/11/12 1830  vancomycin (VANCOCIN) 1,500 mg in sodium chloride 0.9 % 500 mL IVPB  Status:  Discontinued     1,500 mg 250 mL/hr over 120 Minutes Intravenous Every 24 hours 12/11/12 1739 12/12/12 1030   12/11/12 1630  Ampicillin-Sulbactam (UNASYN) 3 g in sodium chloride 0.9 % 100 mL IVPB  Status:  Discontinued     3 g 100 mL/hr over 60 Minutes Intravenous Every 6 hours 12/11/12 1332 12/14/12 0901    Final culture for knee aspirate showed Strep group B and pt ultimately was placed on cefepime for coverage of knee infection and  pseudomonas UTI. PICC line placed for prolonged abx therapy. Pt was significantly debilitated on admission and was found to be poorly tolerant of activity with PT.  CPM utilized for left knee ROM.  Active ROM exercise performed by PT.  Although pt wished to return home he was not considered safe and therefore NHP was initiated. He was given sequential compression devices, early ambulation, and lovenox and coumadin per pharmacy protocol for DVT of bilateral LEs found on doppler studies on admission.  INR therapeutic at discharge and lovenox discontinued with continuation of coumadin long term for treatment of DVT. Dietician assisted with treatment for malnutrition. Supplements recommended and started. Pt required use  of condom catheter as he was incontinent and developed rash in his groin area treated with antifungal powders. Diabetes was controlled with oral agents and sliding scale insulin. Adjustments made in blood pressure meds. He benefited maximally from the hospital stay and there were no complications.    Recent vital signs:  Filed Vitals:   12/16/12 1300  BP: 139/102  Pulse: 68  Temp: 97.9 F (36.6 C)  Resp: 18    Recent laboratory studies:  Lab Results  Component Value Date   HGB 8.7* 12/16/2012   HGB 8.3* 12/13/2012   HGB 8.2* 12/12/2012   Lab Results  Component Value Date   WBC 8.1 12/16/2012   PLT 255 12/16/2012   Lab Results  Component Value Date   INR 2.80* 12/16/2012   Lab Results  Component Value Date   NA 132* 12/16/2012   K 4.1 12/16/2012   CL 91* 12/16/2012   CO2 37* 12/16/2012   BUN 13 12/16/2012   CREATININE 0.61 12/16/2012   GLUCOSE 146* 12/16/2012    Discharge Medications:     Medication List         alfuzosin 10 MG 24 hr tablet  Commonly known as:  UROXATRAL  Take 10 mg by mouth daily.     aspirin 81 MG tablet  Take 81 mg by mouth daily.     dextrose 5 % SOLN 50 mL with ceFEPIme 1 G SOLR 1 g  Inject 1 g into the vein every 12 (twelve) hours.     diclofenac 1.3 % Ptch  Commonly known as:  FLECTOR  Place 1 patch onto the skin 2 (two) times daily.     diclofenac sodium 1 % Gel  Commonly known as:  VOLTAREN  Apply 1 application topically daily as needed (for arthritis). Applies to left knee     docusate sodium 100 MG capsule  Commonly known as:  COLACE  Take 100 mg by mouth daily.     ergocalciferol 50000 UNITS capsule  Commonly known as:  VITAMIN D2  Take 50,000 Units by mouth every 14 (fourteen) days. 1st and 15th of each month     ezetimibe-simvastatin 10-10 MG per tablet  Commonly known as:  VYTORIN  Take 1 tablet by mouth at bedtime.     feeding supplement Liqd  Take 237 mLs by mouth 2 (two) times daily between meals.     finasteride 5 MG tablet   Commonly known as:  PROSCAR  Take 5 mg by mouth daily.     furosemide 10 MG/ML injection  Commonly known as:  LASIX  Inject 4 mLs (40 mg total) into the vein every 12 (twelve) hours.     glimepiride 4 MG tablet  Commonly known as:  AMARYL  Take 4 mg by mouth daily.     HYDROcodone-acetaminophen 5-325 MG per  tablet  Commonly known as:  NORCO/VICODIN  Take 1-2 tablets by mouth every 4 (four) hours as needed for pain.     HYDROcodone-acetaminophen 5-325 MG per tablet  Commonly known as:  NORCO/VICODIN  Take 2 tablets by mouth every 6 (six) hours as needed for pain.     insulin aspart 100 UNIT/ML injection  Commonly known as:  novoLOG  Inject 0-15 Units into the skin 3 (three) times daily with meals.     insulin aspart 100 UNIT/ML injection  Commonly known as:  novoLOG  Inject 0-5 Units into the skin at bedtime.     metFORMIN 1000 MG tablet  Commonly known as:  GLUCOPHAGE  Take 1,000 mg by mouth 2 (two) times daily.     methocarbamol 500 MG tablet  Commonly known as:  ROBAXIN  Take 1 tablet (500 mg total) by mouth every 6 (six) hours as needed.     methocarbamol 500 MG tablet  Commonly known as:  ROBAXIN  Take 500 mg by mouth at bedtime as needed (for muscle spasms/sleep).     metoprolol succinate 50 MG 24 hr tablet  Commonly known as:  TOPROL-XL  Take 50 mg by mouth daily. Take with or immediately following a meal.     omeprazole 20 MG capsule  Commonly known as:  PRILOSEC  Take 20 mg by mouth daily.     ondansetron 4 MG tablet  Commonly known as:  ZOFRAN  Take 4 mg by mouth every 8 (eight) hours as needed (for gas).     pantoprazole 40 MG tablet  Commonly known as:  PROTONIX  Take 1 tablet (40 mg total) by mouth daily.     potassium chloride SA 20 MEQ tablet  Commonly known as:  K-DUR,KLOR-CON  Take 1 tablet (20 mEq total) by mouth daily.     traMADol 50 MG tablet  Commonly known as:  ULTRAM  Take 50 mg by mouth daily as needed for pain.     warfarin 2.5  MG tablet  Commonly known as:  COUMADIN  Take 1 tablet (2.5 mg total) by mouth daily.        Diagnostic Studies: No results found.  Disposition: nursing home      Discharge Orders   Future Orders Complete By Expires     CPM  As directed     Comments:      Continuous passive motion machine (CPM):      Use the CPM from 0 to 45-90 degrees for 6 hours per day.      You may increase by 10-15 per day.  You may break it up into 2 or 3 sessions per day.      Use CPM for 2 weeks or until you are told to stop.    Call MD / Call 911  As directed     Comments:      If you experience chest pain or shortness of breath, CALL 911 and be transported to the hospital emergency room.  If you develope a fever above 101 F, pus (white drainage) or increased drainage or redness at the wound, or calf pain, call your surgeon's office.    Constipation Prevention  As directed     Comments:      Drink plenty of fluids.  Prune juice may be helpful.  You may use a stool softener, such as Colace (over the counter) 100 mg twice a day.  Use MiraLax (over the counter) for constipation as needed.  Diet - low sodium heart healthy  As directed     Discharge instructions  As directed     Comments:      Keep knee incision dry for 5 days post op then may wet while bathing. Therapy daily and CPM goal full extension and greater than 90 degrees flexion. Call if fever or chills or increased drainage. Go to ER if acutely short of breath or call for ambulance. Return for follow up in 2 weeks. May full weight bear on the surgical leg unless told otherwise.    Do not put a pillow under the knee. Place it under the heel.  As directed     Increase activity slowly as tolerated  As directed     TED hose  As directed     Comments:      Use stockings (TED hose) for 6 weeks on both leg(s).  You may remove them at night for sleeping.     PT daily for ambulation and gait training, active ROM of left knee and stretching of left  knee. OT daily for ADLs Ice packs to knee as needed for pain and swelling. Stitches to be removed from knee 14 days from surgery.   May shower with assistance.  Change dressing to knee as needed. IV abx via PICC line  Follow-up Information   Follow up with [name], MD. Schedule an appointment as soon as possible for a visit in 2 weeks.   Contact information:   [address] [place] [address] [phone]        Signed: Wende Neighbors 12/16/2012, 4:33 PM

## 2012-12-16 NOTE — Progress Notes (Signed)
ANTICOAGULATION CONSULT NOTE - Follow-up  Pharmacy Consult for warfarin + lovenox Indication: DVT  Allergies  Allergen Reactions  . Atorvastatin Other (See Comments)    Muscle weakness   . Pravastatin Sodium Other (See Comments)    Muscle weakness   . Rosuvastatin Other (See Comments)    Muscle weakness     Patient Measurements: Height: 5\' 10"  (177.8 cm) Weight: 150 lb 5.7 oz (68.2 kg) IBW/kg (Calculated) : 73  Vital Signs: Temp: 98.8 F (37.1 C) (08/04 0621) Temp src: Oral (08/04 0621) BP: 92/54 mmHg (08/04 0621) Pulse Rate: 89 (08/04 0621)  Labs:  Recent Labs  12/14/12 0520 12/15/12 0508 12/16/12 0425  HGB  --   --  8.7*  HCT  --   --  26.8*  PLT  --   --  255  LABPROT 19.6* 28.9* 28.5*  INR 1.71* 2.85* 2.80*  CREATININE 0.53  --  0.61    Estimated Creatinine Clearance: 60.4 ml/min (by C-G formula based on Cr of 0.61).  Assessment: 89 yom s/p L knee arthorscopy and partial synovectomy for acute pyarthrosis now with new bilateral DVT started on warfarin + lovenox bridge. INR is therapeutic at 2.8 after rapid rise. Dose was held yesterday. CBC is stable, no bleeding noted. Today is day #5 of coumadin + lovenox overlap.   Goal of Therapy:  INR 2-3 Anti-Xa level 0.6-1.2 units/ml 4hrs after LMWH dose given Monitor platelets by anticoagulation protocol: Yes   Plan:  1. Warfarin 2.5mg  PO x 1 tonight 2. F/u AM INR 3. Continue lovenox 70mg  SQ Q12H - Consider DC after todays doses as today is day 5/5 of require overlap therapy. 5. CBC Q72H while on lovenox  Lysle Pearl, PharmD, BCPS Pager # [phone] 12/16/2012 9:40 AM

## 2012-12-16 NOTE — Progress Notes (Signed)
Subjective: 5 Days Post-Op Procedure(s) (LRB): IRRIGATION AND DEBRIDEMENT EXTREMITY (Left) ARTHROSCOPY KNEE (Left) SYNOVECTOMY (Left) Patient reports pain as mild.  Severe pain when moving knee.   "why is my blood pressure so low?"  No symptoms of vertigo. Eating some.   Agrees with NHP to [facility] facility.  States he wasn't walking much at home anyway. Mostly in wheelchair.  Objective: Vital signs in last 24 hours: Temp:  [97.8 F (36.6 C)-98.8 F (37.1 C)] 98.8 F (37.1 C) (08/04 0621) Pulse Rate:  [89-100] 89 (08/04 0621) Resp:  [18] 18 (08/04 0621) BP: (92-106)/(51-54) 92/54 mmHg (08/04 0621) SpO2:  [97 %-99 %] 97 % (08/04 0621)  Intake/Output from previous day: 08/03 0701 - 08/04 0700 In: 1560 [P.O.:1560] Out: 1250 [Urine:1250] Intake/Output this shift:     Recent Labs  12/16/12 0425  HGB 8.7*    Recent Labs  12/16/12 0425  WBC 8.1  RBC 3.48*  HCT 26.8*  PLT 255    Recent Labs  12/14/12 0520 12/16/12 0425  NA 134* 132*  K 4.0 4.1  CL 96 91*  CO2 31 37*  BUN 11 13  CREATININE 0.53 0.61  GLUCOSE 119* 146*  CALCIUM 7.0* 7.2*    Recent Labs  12/15/12 0508 12/16/12 0425  INR 2.85* 2.80*    Neurovascular intact Dorsiflexion/Plantar flexion intact Incision: no drainage Still with flexion contracture of left knee at 20 degrees.  Can push to extension within 10 degrees of full.  Pt cries out in pain with passive flexion.  Can go to 80 degrees passively.  States he used the CPM all weekend. Explained to him the motion limitations will keep him from ambulating and will cause for longer rehab in the NH.  He agrees to work on motion actively.  Knee immobilizer is d/c'd. Assessment/Plan: 5 Days Post-Op Procedure(s) (LRB): IRRIGATION AND DEBRIDEMENT EXTREMITY (Left) ARTHROSCOPY KNEE (Left) SYNOVECTOMY (Left) Up with therapy Discharge to SNF when bed available PT to work in active motion of knee. DC knee immobilizer. Continue abx.  Cefepime started  this weekend to cover septic knee joint and UTI. Will need PICC line for prolonged abx Bilateral DVTs.  Continue coumadin Severe malnutrition:  Continue recommendations of RD   Sherly Brodbeck M 12/16/2012, 8:56 AM

## 2012-12-16 NOTE — Progress Notes (Signed)
Peripherally Inserted Central Catheter/Midline Placement  The IV Nurse has discussed with the patient and/or persons authorized to consent for the patient, the purpose of this procedure and the potential benefits and risks involved with this procedure.  The benefits include less needle sticks, lab draws from the catheter and patient may be discharged home with the catheter.  Risks include, but not limited to, infection, bleeding, blood clot (thrombus formation), and puncture of an artery; nerve damage and irregular heat beat.  Alternatives to this procedure were also discussed.  PICC/Midline Placement Documentation        Curtis White 12/16/2012, 4:48 PM

## 2012-12-16 NOTE — Progress Notes (Addendum)
[facility] PROGRESS NOTE  Curtis White:[id number] DOB: 02/18/1922 DOA: 12/11/2012 PCP: [name], MD  Assessment/Plan: Principal Problem:   Acute DVT (deep venous thrombosis): Anticoagulation started. Lovenox discontinued after today's dose. INR now therapeutic and it has been 5 days of bridging    DIABETES MELLITUS, TYPE II, CONTROLLED: Remaining well-controlled    HYPERLIPIDEMIA-MIXED    HYPERTENSION, BENIGN: Blood pressure a little soft. As we titrated up Lasix, have titrated down Lotensin.    Acute on Chronic systolic CHF (congestive heart failure): Echocardiogram noted ejection fraction of 35-40%. On IV Lasix.. Diuresed 2.1 L.  Patient having some skin breakdown from incontinence. Foley catheter placed. BNP coming down.    Bacterial infection of knee joint: Cultures grew out strep group G. cefepime should cover this as well. Plan is for PICC line and extended IV antibiotics  Severe protein calorie malnutrition: BMI 22. In the setting of chronic illness. Appreciate nutrition help. Patient on Ensure twice a day  UTI: Urine grew out Pseudomonas. Changed from Unasyn to cefepime  Code Status: full code   Consultants:  [facility]  Procedures: Lower Ext dopplers: Done 7/31.  Findings consistent with acute deep vein thrombosis involving popliteal vein of the right lower extremity. - Findings consistent with acute deep vein thrombosis involving the politeal and femoral veins of theleft lower Extremity.   Antibiotics:  IV Unasyn Day 3 stopped on 8/1  IV vancomycin day 3 stopped 8/2  IV cefepime started 8/1-day 4   Objective: Filed Vitals:   12/15/12 0558 12/15/12 1432 12/15/12 2323 12/16/12 0621  BP: 95/55 106/54 96/51 92/54   Pulse: 87 100 91 89  Temp: 97.4 F (36.3 C) 98.4 F (36.9 C) 97.8 F (36.6 C) 98.8 F (37.1 C)  TempSrc: Oral  Oral Oral  Resp: 20 18 18 18   Height:      Weight:      SpO2: 98% 97% 99% 97%     Intake/Output Summary (Last 24 hours) at 12/16/12 0928 Last data filed at 12/16/12 0500  Gross per 24 hour  Intake   1320 ml  Output    750 ml  Net    570 ml   Filed Weights   12/12/12 1100 12/12/12 1500  Weight: 69.854 kg (154 lb) 68.2 kg (150 lb 5.7 oz)    General: Alert x3, moderate pain in left knee  Cardiovascular: Irregular rhythm, rate controlled, 2/6 systolic ejection murmur  Lungs: Clear to auscultation bilaterally  Abdomen: Soft, nontender, nondistended, positive bowel sounds  Extremities: 1-2+ edema bilaterally below the knee bilaterally swollen  Data Reviewed: Basic Metabolic Panel:  Recent Labs Lab 12/11/12 1419 12/12/12 0435 12/13/12 0545 12/14/12 0520 12/16/12 0425  NA 128* 131* 136 134* 132*  K 3.7 3.4* 3.4* 4.0 4.1  CL 91* 97 98 96 91*  CO2 26 26 31 31  37*  GLUCOSE 105* 51* 81 119* 146*  BUN 22 16 12 11 13   CREATININE 0.77 0.51 0.55 0.53 0.61  CALCIUM 8.1* 7.5* 7.3* 7.0* 7.2*   Liver Function Tests:  Recent Labs Lab 12/11/12 1419 12/12/12 0435 12/16/12 0425  AST 12 10  --   ALT 14 9  --   ALKPHOS 93 72  --   BILITOT 0.4 0.3  --   PROT 6.0 4.8*  --   ALBUMIN 2.1* 2.0* 1.8*   CBC:  Recent Labs Lab 12/11/12 1419 12/12/12 0435 12/13/12 0545 12/16/12 0425  WBC 12.5* 10.6* 10.9* 8.1  NEUTROABS 11.1*  --   --   --  HGB 9.4* 8.2* 8.3* 8.7*  HCT 28.2* 24.0* 25.0* 26.8*  MCV 75.0* 74.8* 75.8* 77.0*  PLT 261 249 265 255   CBG:  Recent Labs Lab 12/15/12 0641 12/15/12 1057 12/15/12 1622 12/15/12 2320 12/16/12 0618  GLUCAP 149* 199* 237* 230* 243*    Recent Results (from the past 240 hour(s))  BODY FLUID CULTURE     Status: None   Collection Time    12/11/12  4:46 PM      Result Value Range Status   Specimen Description SYNOVIAL RIGHT KNEE   Final   Special Requests PATIENT ON FOLLOWING UNASYN FLUID ON SWAB   Final   Gram Stain     Final   Value: ABUNDANT WBC PRESENT, PREDOMINANTLY PMN     RARE GRAM POSITIVE COCCI      IN PAIRS IN CHAINS   Culture     Final   Value: ABUNDANT STREPTOCOCCUS GROUP G     Note: SUSCEPTIBILITIES PERFORMED ON PREVIOUS CULTURE WITHIN THE LAST 5 DAYS.   Report Status 12/14/2012 FINAL   Final  AFB CULTURE WITH SMEAR     Status: None   Collection Time    12/11/12  4:46 PM      Result Value Range Status   Specimen Description SYNOVIAL RIGHT KNEE   Final   Special Requests PATIENT ON FOLLOWING UNASYN FLUID ON SWAB   Final   ACID FAST SMEAR NO ACID FAST BACILLI SEEN   Final   Culture     Final   Value: CULTURE WILL BE EXAMINED FOR 6 WEEKS BEFORE ISSUING A FINAL REPORT   Report Status PENDING   Incomplete  ANAEROBIC CULTURE     Status: None   Collection Time    12/11/12  4:46 PM      Result Value Range Status   Specimen Description SYNOVIAL RIGHT KNEE   Final   Special Requests PATIENT ON FOLLOWING UNASYN FLUID ON SWAB   Final   Gram Stain     Final   Value: ABUNDANT WBC PRESENT, PREDOMINANTLY PMN     RARE GRAM POSITIVE COCCI     IN PAIRS IN CHAINS   Culture     Final   Value: NO ANAEROBES ISOLATED; CULTURE IN PROGRESS FOR 5 DAYS   Report Status PENDING   Incomplete  URINE CULTURE     Status: None   Collection Time    12/12/12  2:20 AM      Result Value Range Status   Specimen Description URINE, CLEAN CATCH   Final   Special Requests NONE   Final   Culture  Setup Time 12/12/2012 03:06   Final   Colony Count 90,000 COLONIES/ML   Final   Culture PSEUDOMONAS AERUGINOSA   Final   Report Status 12/14/2012 FINAL   Final   Organism ID, Bacteria PSEUDOMONAS AERUGINOSA   Final     Studies: No results found.  Scheduled Meds: . alfuzosin  10 mg Oral Daily  . benazepril  2.5 mg Oral Daily  . ceFEPime (MAXIPIME) IV  1 g Intravenous Q12H  . coumadin book   Does not apply Once  . docusate sodium  100 mg Oral BID  . enoxaparin (LOVENOX) injection  70 mg Subcutaneous Q12H  . ezetimibe  10 mg Oral QPM   And  . simvastatin  10 mg Oral QPM  . feeding supplement  237 mL Oral  BID BM  . finasteride  5 mg Oral Daily  . furosemide  40 mg  Intravenous Q12H  . glimepiride  4 mg Oral Daily  . insulin aspart  0-15 Units Subcutaneous TID WC  . insulin aspart  0-5 Units Subcutaneous QHS  . metoprolol succinate  50 mg Oral QPC supper  . pantoprazole  40 mg Oral Daily  . potassium chloride  20 mEq Oral Daily  . warfarin   Does not apply Once  . Warfarin - Pharmacist Dosing Inpatient   Does not apply q1800   Continuous Infusions:    Principal Problem:   Acute DVT (deep venous thrombosis) Active Problems:   DIABETES MELLITUS, TYPE II, CONTROLLED   HYPERLIPIDEMIA-MIXED   HYPERTENSION, BENIGN   Bacterial infection of knee joint   Severe malnutrition   Acute on chronic systolic heart failure   UTI (urinary tract infection)   Protein-calorie malnutrition, severe    Time spent: 20 min    Hollice Espy  [facility] Pager [phone]. If 7PM-7AM, please contact night-coverage at [email or web address], password [facility] 12/16/2012, 9:28 AM  LOS: 5 days

## 2012-12-16 NOTE — Progress Notes (Signed)
Physical Therapy Treatment Patient Details Name: Curtis White MRN: [id number] DOB: 1921/11/24 Today's Date: 12/16/2012 Time: 4098-1191 PT Time Calculation (min): 24 min  PT Assessment / Plan / Recommendation  History of Present Illness fell in march 2014 and fx L hip;   PT Comments   Patient progressing very little with bed mobility. Limited with transfers due to pain and unable to ambulation. Patient yelling out with pain while doing AAROM. Unable to complete therex  Follow Up Recommendations  SNF;Supervision/Assistance - 24 hour     Does the patient have the potential to tolerate intense rehabilitation     Barriers to Discharge        Equipment Recommendations  None recommended by PT    Recommendations for Other Services    Frequency Min 3X/week   Progress towards PT Goals Progress towards PT goals: Progressing toward goals  Plan Frequency needs to be updated    Precautions / Restrictions Precautions Precautions: Fall Required Braces or Orthoses: Knee Immobilizer - Left Restrictions LLE Weight Bearing: Weight bearing as tolerated   Pertinent Vitals/Pain Did not rate but grimaces and yells out with mobility    Mobility  Bed Mobility Supine to Sit: 3: Mod assist;HOB elevated;With rails Details for Bed Mobility Assistance: (A) to advance L LE to/off EOB; use of draw pad to bring shoulders to upright sitting position; required increased time and max multimodal cues for hand placement and sequencing; pt unable to use bil UEs fully due to pain and rotator cuff injuries  Transfers Sit to Stand: 3: Mod assist;From bed;With upper extremity assist;From elevated surface Stand to Sit: 3: Mod assist;To chair/3-in-1;With armrests;With upper extremity assist Stand Pivot Transfers: From elevated surface;2: Max assist Details for Transfer Assistance: Patient needing A to initiate stand and to maintain balance. Max assist with pivot as patient attempted to sit prior to getting to  recliner.  Ambulation/Gait Ambulation/Gait Assistance: Not tested (comment)    Exercises Total Joint Exercises Heel Slides: AAROM;Left;10 reps   PT Diagnosis:    PT Problem List:   PT Treatment Interventions:     PT Goals (current goals can now be found in the care plan section)    Visit Information  Last PT Received On: 12/16/12 Assistance Needed: +2 History of Present Illness: fell in march 2014 and fx L hip;    Subjective Data      Cognition  Cognition Arousal/Alertness: Awake/alert Overall Cognitive Status: Within Functional Limits for tasks assessed Memory: Decreased short-term memory    Balance     End of Session PT - End of Session Equipment Utilized During Treatment: Gait belt Activity Tolerance: Patient limited by pain Patient left: in chair;with call bell/phone within reach;with family/visitor present Nurse Communication: Mobility status   GP     Fredrich Birks 12/16/2012, 2:36 PM 12/16/2012 Fredrich Birks PTA [phone] pager [phone] office

## 2012-12-16 NOTE — Progress Notes (Signed)
Pt has rash and reddened area on right and left groin and on scrotum. Small eroded area is present in super pubic area of abdomen. Area cleaned and anti fungal powder applied. Condom catheter placed since patient has been incontinent of urine.

## 2012-12-17 DIAGNOSIS — E43 Unspecified severe protein-calorie malnutrition: Secondary | ICD-10-CM

## 2012-12-17 LAB — GLUCOSE, CAPILLARY

## 2012-12-17 MED ORDER — HEPARIN SOD (PORK) LOCK FLUSH 100 UNIT/ML IV SOLN
250.0000 [IU] | INTRAVENOUS | Status: AC | PRN
  Administered 2012-12-17: 500 [IU]

## 2012-12-17 NOTE — Progress Notes (Signed)
ANTICOAGULATION CONSULT NOTE - Follow-up  Pharmacy Consult for warfarin  Indication: DVT  Allergies  Allergen Reactions  . Atorvastatin Other (See Comments)    Muscle weakness   . Pravastatin Sodium Other (See Comments)    Muscle weakness   . Rosuvastatin Other (See Comments)    Muscle weakness     Patient Measurements: Height: 5\' 10"  (177.8 cm) Weight: 150 lb 5.7 oz (68.2 kg) IBW/kg (Calculated) : 73  Vital Signs: Temp: 98.3 F (36.8 C) (08/05 0541) BP: 91/47 mmHg (08/05 0541) Pulse Rate: 104 (08/05 0541)  Labs:  Recent Labs  12/15/12 0508 12/16/12 0425 12/17/12 0500  HGB  --  8.7*  --   HCT  --  26.8*  --   PLT  --  255  --   LABPROT 28.9* 28.5* 24.6*  INR 2.85* 2.80* 2.31*  CREATININE  --  0.61  --     Estimated Creatinine Clearance: 60.4 ml/min (by C-G formula based on Cr of 0.61).  Assessment: 89 yom s/p L knee arthorscopy and partial synovectomy for acute pyarthrosis now with new bilateral DVT started on warfarin + lovenox bridge. INR is therapeutic at 2.31. CBC is stable, no bleeding noted. Pt has completed day #5 of coumadin + lovenox overlap.  Cefipime for pseudomonal UTI and group G strep septic knee infection.  Afebrile, wbc wnl and dose ok.  Goal of Therapy:  INR 2-3 Anti-Xa level 0.6-1.2 units/ml 4hrs after LMWH dose given Monitor platelets by anticoagulation protocol: Yes   Plan:  1. Warfarin 5mg  PO x 1 tonight 2. F/u AM INR 3. Continue Cefipime 1gm q12   Leota Sauers Pharm.D. CPP, BCPS Clinical Pharmacist [phone] 12/17/2012 4:23 PM

## 2012-12-17 NOTE — Progress Notes (Signed)
[facility] PROGRESS NOTE  Curtis White ZOX:[id number] DOB: 22-Sep-1921 DOA: 12/11/2012 PCP: [name], MD   patient going to skilled nursing facility, likely today. We'll sign off at this point. The patient ends up staying longer any require hospitalists assistance please do not hesitate to call for [place] to see again. We can be reached at [phone].  Please be sure to let flow manager know that we had been seeing this patient & signed off  Assessment/Plan: Principal Problem:   Acute DVT (deep venous thrombosis): Anticoagulation started. INR now therapeutic    DIABETES MELLITUS, TYPE II, CONTROLLED: CBG staying under 200    HYPERLIPIDEMIA-MIXED    HYPERTENSION, BENIGN: Blood pressure a little soft. As we titrated up Lasix, have titrated down Lotensin.    Acute on Chronic systolic CHF (congestive heart failure): Echocardiogram noted ejection fraction of 35-40%. On IV Lasix.. Diuresed 2.3 L.  Patient having some skin breakdown from incontinence. Foley catheter placed. BNP coming down.    Bacterial infection of knee joint: Cultures grew out strep group G. cefepime should cover this as well. Plan is for PICC line and extended IV antibiotics for another 2 more weeks and then P.O. antibiotics  Severe protein calorie malnutrition: BMI 22. In the setting of chronic illness. Appreciate nutrition help. Patient on Ensure twice a day  UTI: Urine grew out Pseudomonas. Changed from Unasyn to cefepime  Code Status: full code   Consultants:  [facility]  Procedures: Lower Ext dopplers: Done 7/31.  Findings consistent with acute deep vein thrombosis involving popliteal vein of the right lower extremity. - Findings consistent with acute deep vein thrombosis involving the politeal and femoral veins of theleft lower Extremity.   Antibiotics:  IV Unasyn Day 3 stopped on 8/1  IV vancomycin day 3 stopped 8/2  IV cefepime started 8/1-day 5/19   Objective: Filed  Vitals:   12/16/12 0621 12/16/12 1300 12/16/12 2048 12/17/12 0541  BP: 92/54 139/102 99/59 91/47  Pulse: 89 68 100 104  Temp: 98.8 F (37.1 C) 97.9 F (36.6 C) 98.7 F (37.1 C) 98.3 F (36.8 C)  TempSrc: Oral Oral Oral   Resp: 18 18 18 20   Height:      Weight:      SpO2: 97% 97% 96% 100%    Intake/Output Summary (Last 24 hours) at 12/17/12 1121 Last data filed at 12/17/12 0428  Gross per 24 hour  Intake    300 ml  Output   1300 ml  Net  -1000 ml   Filed Weights   12/12/12 1100 12/12/12 1500  Weight: 69.854 kg (154 lb) 68.2 kg (150 lb 5.7 oz)    General: Alert x3, moderate pain in left knee  Cardiovascular: Irregular rhythm, rate controlled, 2/6 systolic ejection murmur  Lungs: Clear to auscultation bilaterally  Abdomen: Soft, nontender, nondistended, positive bowel sounds  Extremities: 1-2+ edema bilaterally below the knee bilaterally swollen  Data Reviewed: Basic Metabolic Panel:  Recent Labs Lab 12/11/12 1419 12/12/12 0435 12/13/12 0545 12/14/12 0520 12/16/12 0425  NA 128* 131* 136 134* 132*  K 3.7 3.4* 3.4* 4.0 4.1  CL 91* 97 98 96 91*  CO2 26 26 31 31  37*  GLUCOSE 105* 51* 81 119* 146*  BUN 22 16 12 11 13   CREATININE 0.77 0.51 0.55 0.53 0.61  CALCIUM 8.1* 7.5* 7.3* 7.0* 7.2*   Liver Function Tests:  Recent Labs Lab 12/11/12 1419 12/12/12 0435 12/16/12 0425  AST 12 10  --   ALT 14 9  --  ALKPHOS 93 72  --   BILITOT 0.4 0.3  --   PROT 6.0 4.8*  --   ALBUMIN 2.1* 2.0* 1.8*   CBC:  Recent Labs Lab 12/11/12 1419 12/12/12 0435 12/13/12 0545 12/16/12 0425  WBC 12.5* 10.6* 10.9* 8.1  NEUTROABS 11.1*  --   --   --   HGB 9.4* 8.2* 8.3* 8.7*  HCT 28.2* 24.0* 25.0* 26.8*  MCV 75.0* 74.8* 75.8* 77.0*  PLT 261 249 265 255   CBG:  Recent Labs Lab 12/16/12 0618 12/16/12 1109 12/16/12 1549 12/16/12 2046 12/17/12 0653  GLUCAP 243* 132* 159* 153* 193*    Recent Results (from the past 240 hour(s))  BODY FLUID CULTURE     Status: None    Collection Time    12/11/12  4:46 PM      Result Value Range Status   Specimen Description SYNOVIAL RIGHT KNEE   Final   Special Requests PATIENT ON FOLLOWING UNASYN FLUID ON SWAB   Final   Gram Stain     Final   Value: ABUNDANT WBC PRESENT, PREDOMINANTLY PMN     RARE GRAM POSITIVE COCCI     IN PAIRS IN CHAINS   Culture     Final   Value: ABUNDANT STREPTOCOCCUS GROUP G     Note: SUSCEPTIBILITIES PERFORMED ON PREVIOUS CULTURE WITHIN THE LAST 5 DAYS.   Report Status 12/14/2012 FINAL   Final  AFB CULTURE WITH SMEAR     Status: None   Collection Time    12/11/12  4:46 PM      Result Value Range Status   Specimen Description SYNOVIAL RIGHT KNEE   Final   Special Requests PATIENT ON FOLLOWING UNASYN FLUID ON SWAB   Final   ACID FAST SMEAR NO ACID FAST BACILLI SEEN   Final   Culture     Final   Value: CULTURE WILL BE EXAMINED FOR 6 WEEKS BEFORE ISSUING A FINAL REPORT   Report Status PENDING   Incomplete  ANAEROBIC CULTURE     Status: None   Collection Time    12/11/12  4:46 PM      Result Value Range Status   Specimen Description SYNOVIAL RIGHT KNEE   Final   Special Requests PATIENT ON FOLLOWING UNASYN FLUID ON SWAB   Final   Gram Stain     Final   Value: ABUNDANT WBC PRESENT, PREDOMINANTLY PMN     RARE GRAM POSITIVE COCCI     IN PAIRS IN CHAINS     Performed at [facility]   Culture     Final   Value: NO ANAEROBES ISOLATED     Performed at [facility]   Report Status 12/16/2012 FINAL   Final  URINE CULTURE     Status: None   Collection Time    12/12/12  2:20 AM      Result Value Range Status   Specimen Description URINE, CLEAN CATCH   Final   Special Requests NONE   Final   Culture  Setup Time 12/12/2012 03:06   Final   Colony Count 90,000 COLONIES/ML   Final   Culture PSEUDOMONAS AERUGINOSA   Final   Report Status 12/14/2012 FINAL   Final   Organism ID, Bacteria PSEUDOMONAS AERUGINOSA   Final     Studies: No results found.  Scheduled Meds: .  alfuzosin  10 mg Oral Daily  . benazepril  2.5 mg Oral Daily  . ceFEPime (MAXIPIME) IV  1 g Intravenous Q12H  .  coumadin book   Does not apply Once  . docusate sodium  100 mg Oral BID  . ezetimibe  10 mg Oral QPM   And  . simvastatin  10 mg Oral QPM  . feeding supplement  237 mL Oral BID BM  . finasteride  5 mg Oral Daily  . furosemide  40 mg Intravenous Q12H  . glimepiride  4 mg Oral Daily  . insulin aspart  0-15 Units Subcutaneous TID WC  . insulin aspart  0-5 Units Subcutaneous QHS  . metoprolol succinate  50 mg Oral QPC supper  . pantoprazole  40 mg Oral Daily  . potassium chloride  20 mEq Oral Daily  . warfarin   Does not apply Once  . Warfarin - Pharmacist Dosing Inpatient   Does not apply q1800   Continuous Infusions:    Principal Problem:   Acute DVT (deep venous thrombosis) Active Problems:   DIABETES MELLITUS, TYPE II, CONTROLLED   HYPERLIPIDEMIA-MIXED   HYPERTENSION, BENIGN   Bacterial infection of knee joint   Severe malnutrition   Acute on chronic systolic heart failure   UTI (urinary tract infection)   Protein-calorie malnutrition, severe    Time spent: 20 min    Hollice Espy  [facility] Pager [phone]. If 7PM-7AM, please contact night-coverage at [email or web address], password [facility] 12/17/2012, 11:21 AM  LOS: 6 days

## 2012-12-17 NOTE — Progress Notes (Signed)
Clinical social worker assisted with patient discharge to skilled nursing facility, [facility].  CSW addressed all family questions and concerns. CSW copied chart and added all important documents. CSW also set up patient transportation with [facility]. Clinical Social Worker will sign off for now as social work intervention is no longer needed.  Ashawna Hanback, MSW, [phone] 

## 2012-12-17 NOTE — Progress Notes (Signed)
Subjective: 6 Days Post-Op Procedure(s) (LRB): IRRIGATION AND DEBRIDEMENT EXTREMITY (Left) ARTHROSCOPY KNEE (Left) SYNOVECTOMY (Left) Patient reports pain as mild.    Objective: Vital signs in last 24 hours: Temp:  [97.9 F (36.6 C)-98.7 F (37.1 C)] 98.3 F (36.8 C) (08/05 0541) Pulse Rate:  [68-104] 104 (08/05 0541) Resp:  [18-20] 20 (08/05 0541) BP: (91-139)/(47-102) 91/47 mmHg (08/05 0541) SpO2:  [96 %-100 %] 100 % (08/05 0541)  Intake/Output from previous day: 08/04 0701 - 08/05 0700 In: 300 [IV Piggyback:300] Out: 1300 [Urine:1300] Intake/Output this shift:     Recent Labs  12/16/12 0425  HGB 8.7*    Recent Labs  12/16/12 0425  WBC 8.1  RBC 3.48*  HCT 26.8*  PLT 255    Recent Labs  12/16/12 0425  NA 132*  K 4.1  CL 91*  CO2 37*  BUN 13  CREATININE 0.61  GLUCOSE 146*  CALCIUM 7.2*    Recent Labs  12/16/12 0425 12/17/12 0500  INR 2.80* 2.31*    Neurologically intact he has problems with knee at extremes of ROM.   Assessment/Plan: 6 Days Post-Op Procedure(s) (LRB): IRRIGATION AND DEBRIDEMENT EXTREMITY (Left) ARTHROSCOPY KNEE (Left) SYNOVECTOMY (Left)  IV ABX times 2 wks then PO ABX.  His cultures from the office are Growing strept  "S" to PCN and all ABX tested.   Gloriajean Okun C 12/17/2012, 8:03 AM

## 2012-12-18 ENCOUNTER — Non-Acute Institutional Stay (SKILLED_NURSING_FACILITY): Payer: Medicare Other | Admitting: Adult Health

## 2012-12-18 ENCOUNTER — Other Ambulatory Visit: Payer: Self-pay

## 2012-12-18 DIAGNOSIS — M009 Pyogenic arthritis, unspecified: Secondary | ICD-10-CM

## 2012-12-18 DIAGNOSIS — I10 Essential (primary) hypertension: Secondary | ICD-10-CM

## 2012-12-18 DIAGNOSIS — I5022 Chronic systolic (congestive) heart failure: Secondary | ICD-10-CM

## 2012-12-18 DIAGNOSIS — M00869 Arthritis due to other bacteria, unspecified knee: Secondary | ICD-10-CM

## 2012-12-18 DIAGNOSIS — K5900 Constipation, unspecified: Secondary | ICD-10-CM

## 2012-12-18 DIAGNOSIS — I82402 Acute embolism and thrombosis of unspecified deep veins of left lower extremity: Secondary | ICD-10-CM

## 2012-12-18 DIAGNOSIS — I25810 Atherosclerosis of coronary artery bypass graft(s) without angina pectoris: Secondary | ICD-10-CM

## 2012-12-18 DIAGNOSIS — I5023 Acute on chronic systolic (congestive) heart failure: Secondary | ICD-10-CM

## 2012-12-18 DIAGNOSIS — E785 Hyperlipidemia, unspecified: Secondary | ICD-10-CM

## 2012-12-18 DIAGNOSIS — E119 Type 2 diabetes mellitus without complications: Secondary | ICD-10-CM

## 2012-12-18 DIAGNOSIS — I82409 Acute embolism and thrombosis of unspecified deep veins of unspecified lower extremity: Secondary | ICD-10-CM

## 2012-12-18 DIAGNOSIS — I509 Heart failure, unspecified: Secondary | ICD-10-CM

## 2012-12-18 DIAGNOSIS — K219 Gastro-esophageal reflux disease without esophagitis: Secondary | ICD-10-CM

## 2012-12-23 ENCOUNTER — Non-Acute Institutional Stay (SKILLED_NURSING_FACILITY): Payer: Medicare Other | Admitting: Internal Medicine

## 2012-12-23 DIAGNOSIS — I82403 Acute embolism and thrombosis of unspecified deep veins of lower extremity, bilateral: Secondary | ICD-10-CM

## 2012-12-23 DIAGNOSIS — R5383 Other fatigue: Secondary | ICD-10-CM

## 2012-12-23 DIAGNOSIS — L22 Diaper dermatitis: Secondary | ICD-10-CM | POA: Insufficient documentation

## 2012-12-23 DIAGNOSIS — M00869 Arthritis due to other bacteria, unspecified knee: Secondary | ICD-10-CM

## 2012-12-23 DIAGNOSIS — R531 Weakness: Secondary | ICD-10-CM | POA: Insufficient documentation

## 2012-12-23 DIAGNOSIS — B3749 Other urogenital candidiasis: Secondary | ICD-10-CM

## 2012-12-23 DIAGNOSIS — L98491 Non-pressure chronic ulcer of skin of other sites limited to breakdown of skin: Secondary | ICD-10-CM

## 2012-12-23 DIAGNOSIS — L98499 Non-pressure chronic ulcer of skin of other sites with unspecified severity: Secondary | ICD-10-CM

## 2012-12-23 DIAGNOSIS — M009 Pyogenic arthritis, unspecified: Secondary | ICD-10-CM

## 2012-12-23 DIAGNOSIS — E119 Type 2 diabetes mellitus without complications: Secondary | ICD-10-CM

## 2012-12-23 DIAGNOSIS — I82409 Acute embolism and thrombosis of unspecified deep veins of unspecified lower extremity: Secondary | ICD-10-CM

## 2012-12-23 DIAGNOSIS — B372 Candidiasis of skin and nail: Secondary | ICD-10-CM | POA: Insufficient documentation

## 2012-12-23 DIAGNOSIS — I10 Essential (primary) hypertension: Secondary | ICD-10-CM

## 2012-12-23 NOTE — Progress Notes (Signed)
Patient ID: Curtis White, male   DOB: June 05, 1921, 79 y.o.   MRN: [id number]  [facility]  PCP: Curtis Mask, MD  Allergies  Allergen Reactions  . Atorvastatin Other (See Comments)    Muscle weakness   . Pravastatin Sodium Other (See Comments)    Muscle weakness   . Rosuvastatin Other (See Comments)    Muscle weakness     Chief Complaint  Patient presents with  . Hospitalization Follow-up     HPI:  79 y/o male patient was admitted to the hospital with infection in his left knee. He was taken to the OR and underwent arthroscopic irrigation, debridement and synovectomy. Culture from knee aspirate grew strep B and he was put on cefepime. He also had pseudomonas uti. He also had DVT of both lower extremities. He was found to be severely deconditioned and thus sent to SNF to undergo STR He was seen in his room today. His vitals were reviewed and his bp has been on lower side. Nurse mentions about him having increased redness in his groin and buttock area. He has a condom catheter. He has been eating his meals and has minimal participation with therapy team. Other complaints  ROS- Denies fever or chills Denies nausea, vomiting, abdominal pain Denies diarrhea Denies headache or dizziness Denies focal weakness or seizures Denies chest pain or SOB   Past Medical History  Diagnosis Date  . Mixed hyperlipidemia   . Benign essential HTN   . CAD (coronary artery disease)     a. CABG 1983. b. Re-do CABG 1997. c. Grafts patent 2003.  Marland Kitchen Hyperlipidemia   . Skin rash     of uncertain etiology  . Prostate enlargement     "epididymysis"  . CHF (congestive heart failure)   . Type II diabetes mellitus     controlled  . Shortness of breath     "before first heart surgery" (08/16/2012)  . GERD (gastroesophageal reflux disease)   . Hepatitis     "[employer] quit taking my blood cause they say I had this" (08/16/2012)  . Arthritis     "some, in my calves/legs I guess"  (08/16/2012)    Past Surgical History  Procedure Laterality Date  . Hydrocele excision Left   . Cardiac catheterization      "I think I've had 4" (08/16/2012)  . Compression hip screw Left 07/30/2012    Procedure: Left Trochanteric Compression Screw (Long);  Surgeon: Curtis Manges, MD;  Location: [facility] OR;  Service: Orthopedics;  Laterality: Left;  . Tonsillectomy  1939  . Hernia repair Right 2012  . Coronary artery bypass graft  1983; ?1996    CABG X4; CABG X1  . Cataract extraction w/ intraocular lens  implant, bilateral Bilateral ~ 2000  . Incision and drainage hip Left 08/16/2012    hematoma (08/16/2012)  . Incision and drainage hip Left 08/16/2012    Procedure: IRRIGATION AND DEBRIDEMENT HIP;  Surgeon: Curtis Manges, MD;  Location: [facility] OR;  Service: Orthopedics;  Laterality: Left;  Irrigation and Debridement Left Hip  . I&d extremity Left 12/11/2012    Procedure: IRRIGATION AND DEBRIDEMENT EXTREMITY;  Surgeon: Curtis Manges, MD;  Location: [facility] OR;  Service: Orthopedics;  Laterality: Left;  Left Knee Arthroscopic Washout  . Knee arthroscopy Left 12/11/2012    Procedure: ARTHROSCOPY KNEE;  Surgeon: Curtis Manges, MD;  Location: [facility] OR;  Service: Orthopedics;  Laterality: Left;  . Synovectomy Left 12/11/2012    Procedure: SYNOVECTOMY;  Surgeon:  Curtis Manges, MD;  Location: [facility] OR;  Service: Orthopedics;  Laterality: Left;   Social History:   reports that he quit smoking about 45 years ago. His smoking use included Cigarettes. He has a 28 pack-year smoking history. He quit smokeless tobacco use about 3 years ago. His smokeless tobacco use included Chew. He reports that  drinks alcohol. He reports that he does not use illicit drugs.  No family history on file.  Medications: Patient's Medications  New Prescriptions   No medications on file  Previous Medications   ALFUZOSIN (UROXATRAL) 10 MG 24 HR TABLET    Take 10 mg by mouth daily.     ASPIRIN 81 MG TABLET    Take 81 mg by mouth daily.   DEXTROSE 5 % SOLN 50 ML WITH  CEFEPIME 1 G SOLR 1 G    Inject 1 g into the vein every 12 (twelve) hours.   DICLOFENAC (FLECTOR) 1.3 % PTCH    Place 1 patch onto the skin 2 (two) times daily.   DICLOFENAC SODIUM (VOLTAREN) 1 % GEL    Apply 1 application topically daily as needed (for arthritis). Applies to left knee   DOCUSATE SODIUM (COLACE) 100 MG CAPSULE    Take 100 mg by mouth daily.   ERGOCALCIFEROL (VITAMIN D2) 50000 UNITS CAPSULE    Take 50,000 Units by mouth every 14 (fourteen) days. 1st and 15th of each month   EZETIMIBE-SIMVASTATIN (VYTORIN) 10-10 MG PER TABLET    Take 1 tablet by mouth at bedtime.   FEEDING SUPPLEMENT (ENSURE COMPLETE) LIQD    Take 237 mLs by mouth 2 (two) times daily between meals.   FINASTERIDE (PROSCAR) 5 MG TABLET    Take 5 mg by mouth daily.     FUROSEMIDE (LASIX) 10 MG/ML INJECTION    Inject 4 mLs (40 mg total) into the vein every 12 (twelve) hours.   FUROSEMIDE (LASIX) 40 MG TABLET    Take 40 mg by mouth 2 (two) times daily.   GLIMEPIRIDE (AMARYL) 4 MG TABLET    Take 4 mg by mouth daily.   HYDROCODONE-ACETAMINOPHEN (NORCO/VICODIN) 5-325 MG PER TABLET    Take 2 tablets by mouth every 6 (six) hours as needed for pain.   HYDROCODONE-ACETAMINOPHEN (NORCO/VICODIN) 5-325 MG PER TABLET    Take 1-2 tablets by mouth every 4 (four) hours as needed for pain.   INSULIN ASPART (NOVOLOG) 100 UNIT/ML INJECTION    Inject 0-15 Units into the skin 3 (three) times daily with meals.   INSULIN ASPART (NOVOLOG) 100 UNIT/ML INJECTION    Inject 0-5 Units into the skin at bedtime.   METFORMIN (GLUCOPHAGE) 1000 MG TABLET    Take 1,000 mg by mouth 2 (two) times daily.   METHOCARBAMOL (ROBAXIN) 500 MG TABLET    Take 500 mg by mouth at bedtime as needed (for muscle spasms/sleep).    METHOCARBAMOL (ROBAXIN) 500 MG TABLET    Take 1 tablet (500 mg total) by mouth every 6 (six) hours as needed.   METOPROLOL SUCCINATE (TOPROL-XL) 50 MG 24 HR TABLET    Take 50 mg by mouth daily. Take with or immediately following a meal.    OMEPRAZOLE (PRILOSEC) 20 MG CAPSULE    Take 20 mg by mouth daily.   ONDANSETRON (ZOFRAN) 4 MG TABLET    Take 4 mg by mouth every 8 (eight) hours as needed (for gas).   PANTOPRAZOLE (PROTONIX) 40 MG TABLET    Take 1 tablet (40 mg total) by mouth daily.  POTASSIUM CHLORIDE SA (K-DUR,KLOR-CON) 20 MEQ TABLET    Take 1 tablet (20 mEq total) by mouth daily.   TRAMADOL (ULTRAM) 50 MG TABLET    Take 50 mg by mouth daily as needed for pain.   WARFARIN (COUMADIN) 2.5 MG TABLET    Take 1 tablet (2.5 mg total) by mouth daily.  Modified Medications   No medications on file  Discontinued Medications   No medications on file     Physical Exam: Filed Vitals:   12/23/12 1308  BP: 96/54  Pulse: 88  Temp: 97.4 F (36.3 C)  Resp: 20  SpO2: 97%   gen- elderly, frail male in no acute distress HEENT- no pallor, no citerus, no LAD, MMM CVS- normal s1,s2, rrrr respi- CTAB, no wheeze or rhonchi Skin- picc line in right arm, left knee incision clean with suture -3 in place, dressing clean, redness with soreness in the sacral area and groin area Abdomen- bs+, soft, non tender Ext- able to move all 4, weakness generalized, unsteady gait Neuro- aaox 3, no focal deficit Psych- mood and affect normal   Labs reviewed: Basic Metabolic Panel:  Recent Labs  91/47/82 0545 12/14/12 0520 12/16/12 0425  NA 136 134* 132*  K 3.4* 4.0 4.1  CL 98 96 91*  CO2 31 31 37*  GLUCOSE 81 119* 146*  BUN 12 11 13   CREATININE 0.55 0.53 0.61  CALCIUM 7.3* 7.0* 7.2*   Liver Function Tests:  Recent Labs  07/29/12 1518 12/11/12 1419 12/12/12 0435 12/16/12 0425  AST 17 12 10   --   ALT 17 14 9   --   ALKPHOS 84 93 72  --   BILITOT 0.4 0.4 0.3  --   PROT 6.6 6.0 4.8*  --   ALBUMIN 3.7 2.1* 2.0* 1.8*   CBC:  Recent Labs  08/16/12 0937 08/16/12 1027  12/11/12 1419 12/12/12 0435 12/13/12 0545 12/16/12 0425  WBC 6.6 7.4  --  12.5* 10.6* 10.9* 8.1  NEUTROABS 5.1 5.7  --  11.1*  --   --   --   HGB 9.6*  9.6*  < > 9.4* 8.2* 8.3* 8.7*  HCT 27.9* 27.6*  < > 28.2* 24.0* 25.0* 26.8*  MCV 84.0 84.4  --  75.0* 74.8* 75.8* 77.0*  PLT 335 329  --  261 249 265 255  < > = values in this interval not displayed.  CBG:  Recent Labs  12/16/12 2046 12/17/12 0653 12/17/12 1124  GLUCAP 153* 193* 248*    Procedures: IRRIGATION AND DEBRIDEMENT EXTREMITY ARTHROSCOPY KNEE SYNOVECTOMY on 12/11/2012  Assessment/Plan  Generalized weakness- from deconditioning, will need to work with PT/OT, encouraged po intake. Fall precautions. Continue feeding supplement  Knee infection- complete course of cefepime, to follow with orthopedics. Continue ROM exercises and CPM. Continue norco and voltaren gel with robaxin  DVT- cotninue coumadin with goal inr 2-3  BPH- continue alfuzosin and finasteride for now  Dm- monitor cbg, continue insulin and amaryl with metformin. Check bmp. Continue statin  HTN- bp has been stable, infact running on lower side. Will decrease metoprolol to 25 mg daily for now with holding parameters. Continue asa and statin  GERD- will continue PPI, monitor symptoms  Skin breakdown- poor nutritional status and his urinary incontinence with deconditioning are all factors here. Will insert foley for now to help prevent further skin breakdown. Will take pressure prophylaxis, gel overlay mattress  Candidiasis- will provide fluconazole 150 mg po x 1 and have him on nystatin cream for a  week. Skin care and to keep skin dry  Family/ staff Communication: reviewed care plan with patient and nursing   Labs/tests ordered- cbc, cmp

## 2012-12-25 ENCOUNTER — Non-Acute Institutional Stay (SKILLED_NURSING_FACILITY): Payer: Medicare Other | Admitting: Adult Health

## 2012-12-25 DIAGNOSIS — I82403 Acute embolism and thrombosis of unspecified deep veins of lower extremity, bilateral: Secondary | ICD-10-CM

## 2012-12-25 DIAGNOSIS — I82409 Acute embolism and thrombosis of unspecified deep veins of unspecified lower extremity: Secondary | ICD-10-CM

## 2013-01-08 ENCOUNTER — Non-Acute Institutional Stay (SKILLED_NURSING_FACILITY): Payer: Medicare Other | Admitting: Nurse Practitioner

## 2013-01-08 DIAGNOSIS — Z7901 Long term (current) use of anticoagulants: Secondary | ICD-10-CM

## 2013-01-08 DIAGNOSIS — I82409 Acute embolism and thrombosis of unspecified deep veins of unspecified lower extremity: Secondary | ICD-10-CM

## 2013-01-15 ENCOUNTER — Non-Acute Institutional Stay (SKILLED_NURSING_FACILITY): Payer: Medicare Other | Admitting: Nurse Practitioner

## 2013-01-15 DIAGNOSIS — Z7901 Long term (current) use of anticoagulants: Secondary | ICD-10-CM

## 2013-01-15 DIAGNOSIS — I82409 Acute embolism and thrombosis of unspecified deep veins of unspecified lower extremity: Secondary | ICD-10-CM

## 2013-01-23 LAB — AFB CULTURE WITH SMEAR (NOT AT ARMC): Acid Fast Smear: NONE SEEN

## 2013-01-29 ENCOUNTER — Non-Acute Institutional Stay (SKILLED_NURSING_FACILITY): Payer: Medicare Other | Admitting: Nurse Practitioner

## 2013-01-29 DIAGNOSIS — Z7901 Long term (current) use of anticoagulants: Secondary | ICD-10-CM

## 2013-01-29 DIAGNOSIS — E119 Type 2 diabetes mellitus without complications: Secondary | ICD-10-CM

## 2013-02-03 ENCOUNTER — Non-Acute Institutional Stay (SKILLED_NURSING_FACILITY): Payer: Medicare Other | Admitting: Nurse Practitioner

## 2013-02-03 DIAGNOSIS — I82409 Acute embolism and thrombosis of unspecified deep veins of unspecified lower extremity: Secondary | ICD-10-CM

## 2013-02-03 DIAGNOSIS — I82403 Acute embolism and thrombosis of unspecified deep veins of lower extremity, bilateral: Secondary | ICD-10-CM

## 2013-02-03 DIAGNOSIS — Z7901 Long term (current) use of anticoagulants: Secondary | ICD-10-CM

## 2013-02-12 ENCOUNTER — Non-Acute Institutional Stay (SKILLED_NURSING_FACILITY): Payer: Medicare Other | Admitting: Nurse Practitioner

## 2013-02-12 DIAGNOSIS — I82409 Acute embolism and thrombosis of unspecified deep veins of unspecified lower extremity: Secondary | ICD-10-CM

## 2013-02-12 DIAGNOSIS — I82403 Acute embolism and thrombosis of unspecified deep veins of lower extremity, bilateral: Secondary | ICD-10-CM

## 2013-02-12 DIAGNOSIS — Z7901 Long term (current) use of anticoagulants: Secondary | ICD-10-CM

## 2013-02-17 ENCOUNTER — Non-Acute Institutional Stay (SKILLED_NURSING_FACILITY): Payer: Medicare Other | Admitting: Nurse Practitioner

## 2013-02-17 DIAGNOSIS — Z7901 Long term (current) use of anticoagulants: Secondary | ICD-10-CM

## 2013-02-17 DIAGNOSIS — I82409 Acute embolism and thrombosis of unspecified deep veins of unspecified lower extremity: Secondary | ICD-10-CM

## 2013-02-17 DIAGNOSIS — I82403 Acute embolism and thrombosis of unspecified deep veins of lower extremity, bilateral: Secondary | ICD-10-CM

## 2013-02-18 NOTE — Progress Notes (Signed)
Patient ID: Curtis White, male   DOB: Mar 23, 1922, 79 y.o.   MRN: [id number]  [facility]  Allergies  Allergen Reactions  . Atorvastatin Other (See Comments)    Muscle weakness   . Pravastatin Sodium Other (See Comments)    Muscle weakness   . Rosuvastatin Other (See Comments)    Muscle weakness     Chief Complaint  Patient presents with  . Acute Visit    coumadin management     HPI He is being seen for coumadin management. He is taking coumadin therapy for an acute dvt to the left lower leg. His inr today is 3.8 and he is taking coumadin 2.5 mg daily his previous inr was 2.6   Past Medical History  Diagnosis Date  . Mixed hyperlipidemia   . Benign essential HTN   . CAD (coronary artery disease)     a. CABG 1983. b. Re-do CABG 1997. c. Grafts patent 2003.  Marland Kitchen Hyperlipidemia   . Skin rash     of uncertain etiology  . Prostate enlargement     "epididymysis"  . CHF (congestive heart failure)   . Type II diabetes mellitus     controlled  . Shortness of breath     "before first heart surgery" (08/16/2012)  . GERD (gastroesophageal reflux disease)   . Hepatitis     "[employer] quit taking my blood cause they say I had this" (08/16/2012)  . Arthritis     "some, in my calves/legs I guess"  (08/16/2012)    Past Surgical History  Procedure Laterality Date  . Hydrocele excision Left   . Cardiac catheterization      "I think I've had 4" (08/16/2012)  . Compression hip screw Left 07/30/2012    Procedure: Left Trochanteric Compression Screw (Long);  Surgeon: [name], MD;  Location: [facility] OR;  Service: Orthopedics;  Laterality: Left;  . Tonsillectomy  1939  . Hernia repair Right 2012  . Coronary artery bypass graft  1983; ?1996    CABG X4; CABG X1  . Cataract extraction w/ intraocular lens  implant, bilateral Bilateral ~ 2000  . Incision and drainage hip Left 08/16/2012    hematoma (08/16/2012)  . Incision and drainage hip Left 08/16/2012    Procedure: IRRIGATION AND DEBRIDEMENT HIP;   Surgeon: [name], MD;  Location: [facility] OR;  Service: Orthopedics;  Laterality: Left;  Irrigation and Debridement Left Hip  . I&d extremity Left 12/11/2012    Procedure: IRRIGATION AND DEBRIDEMENT EXTREMITY;  Surgeon: [name], MD;  Location: [facility] OR;  Service: Orthopedics;  Laterality: Left;  Left Knee Arthroscopic Washout  . Knee arthroscopy Left 12/11/2012    Procedure: ARTHROSCOPY KNEE;  Surgeon: [name], MD;  Location: [facility] OR;  Service: Orthopedics;  Laterality: Left;  . Synovectomy Left 12/11/2012    Procedure: SYNOVECTOMY;  Surgeon: [name], MD;  Location: [facility] OR;  Service: Orthopedics;  Laterality: Left;   Filed Vitals:   12/25/12 1353  BP: 127/68  Pulse: 73  Height: 5\' 10"  (1.778 m)  Weight: 150 lb (68.04 kg)       MEDICATIONS  vytorin 10/10 mg daily Lasix 40 mg twice daily Cefepime 1 gm twice daily zofran 4 mg every 8 hours as needed voltaren gel 1% 4 gm to knees four times daily as needed urozatrol 10 mg daily Asa 81 mg daily flecfor patch daily as needed Colace twice daily Vitamin d 50,000 units twice weekly proscar 5 mg daily prilosec  20 mg daily Metformin 1 gm twice daily amaryl 4 mg daily toprol cl 50 mg daily kdur 20 meq dialy Ultram 50 mg daily as needed Coumadin 4mg  daily novolog 5 units prior to meals for cbg >=150  Robaxin 500 mg every 6 hours as needed      SIGNIFICANT DIAGNOSTIC EXAMS   LABS REVIEWED;   12-18-12: wbc 9.1; hgb 8.4; hct 28.2; mcv 81.5; plt 314; glucose 110; bun 15; creat 0.6; k+4.5; na++135  Review of Systems  Constitutional: Negative for malaise/fatigue.  Respiratory: Negative for cough and shortness of breath.   Cardiovascular: Negative for chest pain and palpitations.  Gastrointestinal: no constipation  Negative for heartburn and abdominal pain.  Musculoskeletal: . Negative for myalgias.  Skin: Negative.   Neurological: Negative for headaches.  Psychiatric/Behavioral: Negative for depression. The patient does  not have insomnia.    Physical Exam  Constitutional: He appears well-developed and well-nourished.  Neck: Neck supple. No JVD present.  Cardiovascular: Normal rate, regular rhythm and intact distal pulses.   Respiratory: Effort normal and breath sounds normal. No respiratory distress. He has no wheezes.  GI: Soft. Bowel sounds are normal. He exhibits no distension. There is no tenderness.  Musculoskeletal: He exhibits no edema.  Is able to move extremities   Neurological: He is alert.  Skin: Skin is warm and dry.    ASSESSMENT PLAN   DVT: will hold coumadin therapy for 2 days and will check inr; will continue to monitor his status

## 2013-02-18 NOTE — Progress Notes (Signed)
Patient ID: Curtis White, male   DOB: [birth year], 79 y.o.   MRN: [id number]  [facility]  Allergies  Allergen Reactions  . Atorvastatin Other (See Comments)    Muscle weakness   . Pravastatin Sodium Other (See Comments)    Muscle weakness   . Rosuvastatin Other (See Comments)    Muscle weakness      Chief Complaint  Patient presents with  . Hospitalization Follow-up    HPI: He has been hospitalized for his left knee infection for which he underwent arthroscopic surgery. He also developed a left leg dvt. He is here for abt; shoft term rehab and will return back home.    Past Medical History  Diagnosis Date  . Mixed hyperlipidemia   . Benign essential HTN   . CAD (coronary artery disease)     a. CABG 1983. b. Re-do CABG 1997. c. Grafts patent 2003.  Marland Kitchen Hyperlipidemia   . Skin rash     of uncertain etiology  . Prostate enlargement     "epididymysis"  . CHF (congestive heart failure)   . Type II diabetes mellitus     controlled  . Shortness of breath     "before first heart surgery" (08/16/2012)  . GERD (gastroesophageal reflux disease)   . Hepatitis     "[employer] quit taking my blood cause they say I had this" (08/16/2012)  . Arthritis     "some, in my calves/legs I guess"  (08/16/2012)    Past Surgical History  Procedure Laterality Date  . Hydrocele excision Left   . Cardiac catheterization      "I think I've had 4" (08/16/2012)  . Compression hip screw Left 07/30/2012    Procedure: Left Trochanteric Compression Screw (Long);  Surgeon: [name], MD;  Location: [facility] OR;  Service: Orthopedics;  Laterality: Left;  . Tonsillectomy  1939  . Hernia repair Right 2012  . Coronary artery bypass graft  1983; ?1996    CABG X4; CABG X1  . Cataract extraction w/ intraocular lens  implant, bilateral Bilateral ~ 2000  . Incision and drainage hip Left 08/16/2012    hematoma (08/16/2012)  . Incision and drainage hip Left 08/16/2012    Procedure: IRRIGATION AND DEBRIDEMENT HIP;   Surgeon: [name], MD;  Location: [facility] OR;  Service: Orthopedics;  Laterality: Left;  Irrigation and Debridement Left Hip  . I&d extremity Left 12/11/2012    Procedure: IRRIGATION AND DEBRIDEMENT EXTREMITY;  Surgeon: [name], MD;  Location: [facility] OR;  Service: Orthopedics;  Laterality: Left;  Left Knee Arthroscopic Washout  . Knee arthroscopy Left 12/11/2012    Procedure: ARTHROSCOPY KNEE;  Surgeon: [name], MD;  Location: [facility] OR;  Service: Orthopedics;  Laterality: Left;  . Synovectomy Left 12/11/2012    Procedure: SYNOVECTOMY;  Surgeon: [name], MD;  Location: [facility] OR;  Service: Orthopedics;  Laterality: Left;    VITAL SIGNS BP 132/74  Pulse 68  Ht 5\' 10"  (1.778 m)  Wt 150 lb (68.04 kg)  BMI 21.52 kg/m2    MEDICATIONS  vytorin 10/10 mg daily Lasix 40 mg twice daily Cefepime 1 gm twice daily zofran 4 mg every 8 hours as needed voltaren gel 1% 4 gm to knees four times daily as needed urozatrol 10 mg daily Asa 81 mg daily flecfor patch daily as needed Colace twice daily Vitamin d 50,000 units twice weekly proscar 5 mg daily prilosec 20 mg daily Metformin 1 gm twice daily amaryl 4 mg  daily toprol cl 50 mg daily kdur 20 meq dialy Ultram 50 mg daily as needed Coumadin 4mg  daily novolog 5 units prior to meals for cbg >=150  Robaxin 500 mg every 6 hours as needed      SIGNIFICANT DIAGNOSTIC EXAMS   LABS REVIEWED;   12-18-12: wbc 9.1; hgb 8.4; hct 28.2; mcv 81.5; plt 314; glucose 110; bun 15; creat 0.6; k+4.5; na++135  Review of Systems  Constitutional: Negative for malaise/fatigue.  Respiratory: Negative for cough and shortness of breath.   Cardiovascular: Negative for chest pain and palpitations.  Gastrointestinal: Positive for constipation. Negative for heartburn and abdominal pain.  Musculoskeletal: Positive for joint pain. Negative for myalgias.  Skin: Negative.   Neurological: Negative for headaches.  Psychiatric/Behavioral: Negative for depression. The  patient does not have insomnia.    Physical Exam  Constitutional: He appears well-developed and well-nourished.  Neck: Neck supple. No JVD present.  Cardiovascular: Normal rate, regular rhythm and intact distal pulses.   Respiratory: Effort normal and breath sounds normal. No respiratory distress. He has no wheezes.  GI: Soft. Bowel sounds are normal. He exhibits no distension. There is no tenderness.  Musculoskeletal: He exhibits no edema.  Is able to move extremities   Neurological: He is alert.  Skin: Skin is warm and dry.  Knee without signs of infection present        ASSESSMENT/ PLAN:  1. Diabetes type II: is stable will continue metformin 1 gm twice daily amaryl 4 mg daily novolog 5 units prior to meals for cbg >=150 and will monitor  2. Dyslipidemia: will continue vytorin 10/10 mg daily 3. Cad: will continue asa 81 mg daily 4. Bph: will continue proscar 5 mg daily 5. gerd will continue prilosec 20 mg daily 6. Chronic systolic chf: is stable will continue lasix 40 mg twice daily with k+ 20 meq daily  7. Constipation: is worse; will add senna 2 tabs daily and will monitor 8. Left knee infection: is having increased pain present; will change the vicodin to 10/315 mg every 4 hour for 72 hours every every 4 hours as needed for pain will continue robaxin 500 mg every 6hours as needed and ultram 50 mg daily as needed and  cefepime 1 gm twice dialy until seen by I/D  will continue therapy as directed and will monitor his status. He is  Here for short term rehab with the goal for his to return back home.  9. dvt left leg: coumadin 4 mg daily inr is 2.9 will repeat inr in one week 10. Hypertension: will continue toprol xl 50 mg dia ly and will monitor   Time spent with patient 50 minutes.

## 2013-03-05 ENCOUNTER — Non-Acute Institutional Stay (SKILLED_NURSING_FACILITY): Payer: Medicare Other | Admitting: Nurse Practitioner

## 2013-03-05 DIAGNOSIS — M009 Pyogenic arthritis, unspecified: Secondary | ICD-10-CM

## 2013-03-05 DIAGNOSIS — Z7901 Long term (current) use of anticoagulants: Secondary | ICD-10-CM

## 2013-03-05 DIAGNOSIS — I82409 Acute embolism and thrombosis of unspecified deep veins of unspecified lower extremity: Secondary | ICD-10-CM

## 2013-03-05 DIAGNOSIS — I5022 Chronic systolic (congestive) heart failure: Secondary | ICD-10-CM

## 2013-03-05 DIAGNOSIS — I509 Heart failure, unspecified: Secondary | ICD-10-CM

## 2013-03-05 DIAGNOSIS — M00869 Arthritis due to other bacteria, unspecified knee: Secondary | ICD-10-CM

## 2013-03-05 DIAGNOSIS — I82403 Acute embolism and thrombosis of unspecified deep veins of lower extremity, bilateral: Secondary | ICD-10-CM

## 2013-03-05 DIAGNOSIS — E119 Type 2 diabetes mellitus without complications: Secondary | ICD-10-CM

## 2013-03-05 DIAGNOSIS — I10 Essential (primary) hypertension: Secondary | ICD-10-CM

## 2013-03-08 DIAGNOSIS — M25569 Pain in unspecified knee: Secondary | ICD-10-CM

## 2013-03-08 DIAGNOSIS — M6281 Muscle weakness (generalized): Secondary | ICD-10-CM

## 2013-03-08 DIAGNOSIS — I5022 Chronic systolic (congestive) heart failure: Secondary | ICD-10-CM

## 2013-03-08 DIAGNOSIS — R262 Difficulty in walking, not elsewhere classified: Secondary | ICD-10-CM

## 2013-03-20 ENCOUNTER — Other Ambulatory Visit: Payer: Self-pay

## 2013-05-15 NOTE — Progress Notes (Signed)
01/08/2013 [facility] [address]  Patient ID: Curtis White, male   DOB: 07/24/1921, 80 y.o.   MRN: [id number]  Allergies  Allergen Reactions  . Atorvastatin Other (See Comments)    Muscle weakness   . Pravastatin Sodium Other (See Comments)    Muscle weakness   . Rosuvastatin Other (See Comments)    Muscle weakness    Chief Complaint  Patient presents with  . Acute Visit   HPI:  Pt on long term anti-coagulant therapy r/t DVT.  No new verbalized c/o Current dose of Coumadin 2 mg each evening.  Past Medical History  Diagnosis Date  . Mixed hyperlipidemia   . Benign essential HTN   . CAD (coronary artery disease)     a. CABG 1983. b. Re-do CABG 1997. c. Grafts patent 2003.  Marland Kitchen Hyperlipidemia   . Skin rash     of uncertain etiology  . Prostate enlargement     "epididymysis"  . CHF (congestive heart failure)   . Type II diabetes mellitus     controlled  . Shortness of breath     "before first heart surgery" (08/16/2012)  . GERD (gastroesophageal reflux disease)   . Hepatitis     "[employer] quit taking my blood cause they say I had this" (08/16/2012)  . Arthritis     "some, in my calves/legs I guess"  (08/16/2012)    Current Outpatient Prescriptions on File Prior to Visit  Medication Sig Dispense Refill  . alfuzosin (UROXATRAL) 10 MG 24 hr tablet Take 10 mg by mouth daily.        Marland Kitchen aspirin 81 MG tablet Take 81 mg by mouth daily.      Marland Kitchen dextrose 5 % SOLN 50 mL with ceFEPIme 1 G SOLR 1 g Inject 1 g into the vein every 12 (twelve) hours.  28 Units  0  . diclofenac (FLECTOR) 1.3 % PTCH Place 1 patch onto the skin 2 (two) times daily.      . diclofenac sodium (VOLTAREN) 1 % GEL Apply 1 application topically daily as needed (for arthritis). Applies to left knee      . docusate sodium (COLACE) 100 MG capsule Take 100 mg by mouth daily.      . ergocalciferol (VITAMIN D2) 50000 UNITS capsule Take 50,000 Units by mouth every 14 (fourteen) days. 1st and 15th of each month      .  ezetimibe-simvastatin (VYTORIN) 10-10 MG per tablet Take 1 tablet by mouth at bedtime.  30 tablet  0  . feeding supplement (ENSURE COMPLETE) LIQD Take 237 mLs by mouth 2 (two) times daily between meals.      . finasteride (PROSCAR) 5 MG tablet Take 5 mg by mouth daily.        . furosemide (LASIX) 10 MG/ML injection Inject 4 mLs (40 mg total) into the vein every 12 (twelve) hours.  4 mL  0  . furosemide (LASIX) 40 MG tablet Take 40 mg by mouth 2 (two) times daily.      Marland Kitchen glimepiride (AMARYL) 4 MG tablet Take 4 mg by mouth daily.      Marland Kitchen HYDROcodone-acetaminophen (NORCO/VICODIN) 5-325 MG per tablet Take 2 tablets by mouth every 6 (six) hours as needed for pain.      Marland Kitchen HYDROcodone-acetaminophen (NORCO/VICODIN) 5-325 MG per tablet Take 1-2 tablets by mouth every 4 (four) hours as needed for pain.  30 tablet  0  . insulin aspart (NOVOLOG) 100 UNIT/ML injection Inject 0-15 Units  into the skin 3 (three) times daily with meals.  1 vial  12  . insulin aspart (NOVOLOG) 100 UNIT/ML injection Inject 0-5 Units into the skin at bedtime.  1 vial  12  . metFORMIN (GLUCOPHAGE) 1000 MG tablet Take 1,000 mg by mouth 2 (two) times daily.      . methocarbamol (ROBAXIN) 500 MG tablet Take 500 mg by mouth at bedtime as needed (for muscle spasms/sleep).       . methocarbamol (ROBAXIN) 500 MG tablet Take 1 tablet (500 mg total) by mouth every 6 (six) hours as needed.  30 tablet  0  . metoprolol succinate (TOPROL-XL) 50 MG 24 hr tablet Take 50 mg by mouth daily. Take with or immediately following a meal.      . omeprazole (PRILOSEC) 20 MG capsule Take 20 mg by mouth daily.      . ondansetron (ZOFRAN) 4 MG tablet Take 4 mg by mouth every 8 (eight) hours as needed (for gas).      . pantoprazole (PROTONIX) 40 MG tablet Take 1 tablet (40 mg total) by mouth daily.      . potassium chloride SA (K-DUR,KLOR-CON) 20 MEQ tablet Take 1 tablet (20 mEq total) by mouth daily.      . traMADol (ULTRAM) 50 MG tablet Take 50 mg by mouth daily  as needed for pain.      Marland Kitchen. warfarin (COUMADIN) 2.5 MG tablet Take 1 tablet (2.5 mg total) by mouth daily.      . [DISCONTINUED] glyBURIDE-metformin (GLUCOVANCE) 5-500 MG per tablet Take 2 tablets by mouth 2 (two) times daily with a meal.        No current facility-administered medications on file prior to visit.    LABS REVIEWED;    12-18-12: wbc 9.1; hgb 8.4; hct 28.2; mcv 81.5; plt 314; glucose 110; bun 15; creat 0.6; k+4.5; na++135  01/01/2013 INR 1.9 01/08/2013 INR 1.8  Pt with no new changes.  Assessment/plan:  INR is just below the accepted therapeutic value.  Will increase the current 2 mg Coumadin to 2.5 mg each evening.   Will reassess on 01/10/2013

## 2013-05-15 NOTE — Progress Notes (Signed)
[facility]  Patient ID: Curtis White, male   DOB: May 05, 1922, 80 y.o.   MRN: [id number]  Allergies  Allergen Reactions  . Atorvastatin Other (See Comments)    Muscle weakness   . Pravastatin Sodium Other (See Comments)    Muscle weakness   . Rosuvastatin Other (See Comments)    Muscle weakness    Chief Complaint  Patient presents with  . Acute Visit   HPI:  Pt with history of DVT on long-term anticoagulant therapy.  Has been on 2.5 mg each pm.    Past Medical History  Diagnosis Date  . Mixed hyperlipidemia   . Benign essential HTN   . CAD (coronary artery disease)     a. CABG 1983. b. Re-do CABG 1997. c. Grafts patent 2003.  Marland Kitchen. Hyperlipidemia   . Skin rash     of uncertain etiology  . Prostate enlargement     "epididymysis"  . CHF (congestive heart failure)   . Type II diabetes mellitus     controlled  . Shortness of breath     "before first heart surgery" (08/16/2012)  . GERD (gastroesophageal reflux disease)   . Hepatitis     "[employer] quit taking my blood cause they say I had this" (08/16/2012)  . Arthritis     "some, in my calves/legs I guess"  (08/16/2012)   Current Outpatient Prescriptions on File Prior to Visit  Medication Sig Dispense Refill  . alfuzosin (UROXATRAL) 10 MG 24 hr tablet Take 10 mg by mouth daily.        Marland Kitchen. aspirin 81 MG tablet Take 81 mg by mouth daily.      Marland Kitchen. dextrose 5 % SOLN 50 mL with ceFEPIme 1 G SOLR 1 g Inject 1 g into the vein every 12 (twelve) hours.  28 Units  0  . diclofenac (FLECTOR) 1.3 % PTCH Place 1 patch onto the skin 2 (two) times daily.      . diclofenac sodium (VOLTAREN) 1 % GEL Apply 1 application topically daily as needed (for arthritis). Applies to left knee      . docusate sodium (COLACE) 100 MG capsule Take 100 mg by mouth daily.      . ergocalciferol (VITAMIN D2) 50000 UNITS capsule Take 50,000 Units by mouth every 14 (fourteen) days. 1st and 15th of each month      .  ezetimibe-simvastatin (VYTORIN) 10-10 MG per tablet Take 1 tablet by mouth at bedtime.  30 tablet  0  . feeding supplement (ENSURE COMPLETE) LIQD Take 237 mLs by mouth 2 (two) times daily between meals.      . finasteride (PROSCAR) 5 MG tablet Take 5 mg by mouth daily.        . furosemide (LASIX) 10 MG/ML injection Inject 4 mLs (40 mg total) into the vein every 12 (twelve) hours.  4 mL  0  . furosemide (LASIX) 40 MG tablet Take 40 mg by mouth 2 (two) times daily.      Marland Kitchen. glimepiride (AMARYL) 4 MG tablet Take 4 mg by mouth daily.      Marland Kitchen. HYDROcodone-acetaminophen (NORCO/VICODIN) 5-325 MG per tablet Take 2 tablets by mouth every 6 (six) hours as needed for pain.      Marland Kitchen. HYDROcodone-acetaminophen (NORCO/VICODIN) 5-325 MG per tablet Take 1-2 tablets by mouth every 4 (four) hours as needed for pain.  30 tablet  0  . insulin aspart (NOVOLOG) 100 UNIT/ML injection Inject 0-15 Units into  the skin 3 (three) times daily with meals.  1 vial  12  . insulin aspart (NOVOLOG) 100 UNIT/ML injection Inject 0-5 Units into the skin at bedtime.  1 vial  12  . metFORMIN (GLUCOPHAGE) 1000 MG tablet Take 1,000 mg by mouth 2 (two) times daily.      . methocarbamol (ROBAXIN) 500 MG tablet Take 500 mg by mouth at bedtime as needed (for muscle spasms/sleep).       . methocarbamol (ROBAXIN) 500 MG tablet Take 1 tablet (500 mg total) by mouth every 6 (six) hours as needed.  30 tablet  0  . metoprolol succinate (TOPROL-XL) 50 MG 24 hr tablet Take 50 mg by mouth daily. Take with or immediately following a meal.      . omeprazole (PRILOSEC) 20 MG capsule Take 20 mg by mouth daily.      . ondansetron (ZOFRAN) 4 MG tablet Take 4 mg by mouth every 8 (eight) hours as needed (for gas).      . pantoprazole (PROTONIX) 40 MG tablet Take 1 tablet (40 mg total) by mouth daily.      . potassium chloride SA (K-DUR,KLOR-CON) 20 MEQ tablet Take 1 tablet (20 mEq total) by mouth daily.      . traMADol (ULTRAM) 50 MG tablet Take 50 mg by mouth daily  as needed for pain.      Marland Kitchen warfarin (COUMADIN) 2.5 MG tablet Take 1 tablet (2.5 mg total) by mouth daily.      . [DISCONTINUED] glyBURIDE-metformin (GLUCOVANCE) 5-500 MG per tablet Take 2 tablets by mouth 2 (two) times daily with a meal.        No current facility-administered medications on file prior to visit.    LABS REVIEWED;    12-18-12: wbc 9.1; hgb 8.4; hct 28.2; mcv 81.5; plt 314; glucose 110; bun 15; creat 0.6; k+4.5; na++135  01/01/2013 INR 1.9 01/08/2013 INR 1.8 01/15/2013 INR 1.2  Pt with no new c/o, remaining stable.  Assessment/Plan:  INR remains sub-therapeutic.  Will increase Coumadin 4 mg per day.  Will reassess on 01/17/2013.

## 2013-05-21 NOTE — Progress Notes (Addendum)
Date of Visit:  03/05/2013 CODE STATUS is full code [facility], room #501  Patient ID: Curtis White, male   DOB: Sep 14, 1921, 80 y.o.   MRN: [id number]  Allergies  Allergen Reactions  . Atorvastatin Other (See Comments)    Muscle weakness   . Pravastatin Sodium Other (See Comments)    Muscle weakness   . Rosuvastatin Other (See Comments)    Muscle weakness    Chief Complaint  Patient presents with  . Discharge Note   HPI:  Scheduled to be discharged on 03/07/2013.   Pt with multiple medical problems, including HTN, CAD, Acute DVT, of both lower extremities,  Protein calorie malnutrition, also diabetes.  Because of the DVT, the pt is on long term anti-coagulant therapy, with coumadin.    Pt had infection in L knee and required an arthroscopy for debridement of the L knee joint.  He received extensive antibiotic therapy, including IV Cefepime.    Pt also had a foley catheter which caused trauma to posterior penis.  Was removed and pt was able to void without difficulty.  I&O cath was done after urination, and showed no residual.    After his arthroscopic surgery, pt was d/c'ed to [facility] for Re-hab, including strengthening, mobility, safety and adaptation for ADL's.  States he has more stiffness than pain in his L knee operative area.  Now pt can walk safely, and has been out to eat several times with his family.  Past Medical History  Diagnosis Date  . Mixed hyperlipidemia   . Benign essential HTN   . CAD (coronary artery disease)     a. CABG 1983. b. Re-do CABG 1997. c. Grafts patent 2003.  Marland Kitchen Hyperlipidemia   . Skin rash     of uncertain etiology  . Prostate enlargement     "epididymysis"  . CHF (congestive heart failure)   . Type II diabetes mellitus     controlled  . Shortness of breath     "before first heart surgery" (08/16/2012)  . GERD (gastroesophageal reflux disease)   . Hepatitis     "[employer] quit taking my blood cause they say I had this"  (08/16/2012)  . Arthritis     "some, in my calves/legs I guess"  (08/16/2012)   Past Surgical History  Procedure Laterality Date  . Hydrocele excision Left   . Cardiac catheterization      "I think I've had 4" (08/16/2012)  . Compression hip screw Left 07/30/2012    Procedure: Left Trochanteric Compression Screw (Long);  Surgeon: [name], MD;  Location: [facility] OR;  Service: Orthopedics;  Laterality: Left;  . Tonsillectomy  1939  . Hernia repair Right 2012  . Coronary artery bypass graft  1983; ?1996    CABG X4; CABG X1  . Cataract extraction w/ intraocular lens  implant, bilateral Bilateral ~ 2000  . Incision and drainage hip Left 08/16/2012    hematoma (08/16/2012)  . Incision and drainage hip Left 08/16/2012    Procedure: IRRIGATION AND DEBRIDEMENT HIP;  Surgeon: [name], MD;  Location: [facility] OR;  Service: Orthopedics;  Laterality: Left;  Irrigation and Debridement Left Hip  . I&d extremity Left 12/11/2012    Procedure: IRRIGATION AND DEBRIDEMENT EXTREMITY;  Surgeon: [name], MD;  Location: [facility] OR;  Service: Orthopedics;  Laterality: Left;  Left Knee Arthroscopic Washout  . Knee arthroscopy Left 12/11/2012    Procedure: ARTHROSCOPY KNEE;  Surgeon: [name], MD;  Location:  [facility] OR;  Service: Orthopedics;  Laterality: Left;  . Synovectomy Left 12/11/2012    Procedure: SYNOVECTOMY;  Surgeon: Eldred MangesMark C Yates, MD;  [name];  Service: Orthopedics;  Laterality: Left;   Discharge Medications:   Robaxin 500 mg every 6 hrs as needed for spasms. Voltaren gel 1 % apply to L knee as needed, once a day Flector 1% patch, apply daily, after removing the old patch, to most painful area. Vitamin D3 50,000 units every 2 weeks Proscar 5 mg one each day Toprol XL 25 mg each day, hold for systolic BP  < 110 or Pulse <60.   Lasix 40 mg one each day Vytorin 10/10, one tablet each evening Zofrean 40 mg one every 8 hours as needed for nausea/vomiting Tramadol 50 mg one twice a day as needed for  pain. Norco 10/325 one every 4 hours as needed for pain Humalog 100 units/ml, 5 units subcutaneously if before meal CBG > 150 Amaryl 4 mg one each day Glucophage 1000 mg one tab twice a day Jantoven 4 mg one each evening Prilosec 20 mg each day K-Dur 2 meq, 1 tab each day Uroxatral 10 mg each day  Recent Labs 01/28/2013 WBC 8.1 RBC 3.7 HGB 9.8 HCT 31.8 MCV 86.4 MCH 26.6 MCHC 30.8 RDW 19.3 PLT 275  HGBA1c 6.5  Chol 76 HDL 31 LDL 24 VLDL 20.6 Trig 103  12/24/2012  Na 134 K 3.8 Cl 93 CO2 32 AGAP 10 Glucose 101 BUN 20 Ceatinine 0.6 Ca 8.5  BP 106/52  Pulse 77  Temp(Src) 96.4 F (35.8 C)  Resp 18  SpO2 99%  Alert, verbally appropriate, oriented x 3, NAD PERRLA, EOMI,+'ive red reflex, unable to visualize optic disc TM's ok Oral Pharynx, dentures in place, no gross oral lesions.   No palpable cervical adenopathy or thyromegaly Apical pulse RRR BBrS diminished but clear Abdomen, +'ive bowel sounds, soft and un-distended.  Non-tender to palpation. 250 ml of clear yellow urine noted in urinal Easily palpable posterior tibial pulses bilaterally, minimal lower extremity edema L knee surgical site well healed, no drainage, or erythema.  No evidence of any infection.  Assessement/Plan  Scheduled for D/C on 03/07/2013. Will need services of OT/PT for strengthening, mobility, and adaptation of ADL's.  Also will order services of RN, for monitoring of INR's and diabetes assessment/management.  R/T bilateral DVT's, pt is on 4 mg of Coumadin every evening, likely, the dose will need to be intermittently adjusted.  Pt will be followed by his primary care provider.  Services of an Charity fundraiserN, would allow closer management, and more timely dosage adjustments.   Pt's diabetes is under such good control, likely some of his medications could possibly be d/c'ed. Pt will be followed by his primary care provider  Knee stiffness and pain, Continue the Robaxin 500 mg every 6 hours as  needed for spasms.  Continue the use of the Flector patch, and Voltaren gel 1% as prescribed.  Have written a script for a  very limited amount of Norco 10/325.  For any additional pain control pt knows that he will be followed by his orthopedist.   Supplementation, will continue the 50,000 units of Vit D3 every 2 weeks.    Congestive heart failure/hypertension, will continue lasix at 40 mg and Metoprolol XL at 50 mg per day. Will continue the 20 meq of K+ each day, will need scheduled lab testing to assess for electrolyte levels and renal function.  RN services, would allow means for direct lab  testing when pt is d/c'ed [place].  Prostatic hypertrophy, continue the Proscar 5 mg each day.   Addendum Labs for 03/06/2013  WBC 8.5 RBC 3.5 Hemoglobin 9.5 Hematocrit 30.8 MCV 87.7 MCH 27.1 MCHC 30.8 RDW 16 Platelets 341 Hemoglobin A1c 6.1  Sodium 137 Potassium 4 Chloride 96 CO2 29 AGAP 13 Glucose 128 BUN 19 Creatinine 0.7 Calcium 8.9

## 2013-05-22 NOTE — Progress Notes (Signed)
[facility]  Patient ID: Curtis KiltsJohn W White, male   DOB: [birth year], 80 y.o.   MRN: [id number]  Allergies  Allergen Reactions  . Atorvastatin Other (See Comments)    Muscle weakness   . Pravastatin Sodium Other (See Comments)    Muscle weakness   . Rosuvastatin Other (See Comments)    Muscle weakness    Chief Complaint  Patient presents with  . Acute Visit   HPI:  Pt with history of DVT on long-term anticoagulant therapy.  At last visit, his Coumadin was increased to 4.5 mg.  Past Medical History  Diagnosis Date  . Mixed hyperlipidemia   . Benign essential HTN   . CAD (coronary artery disease)     a. CABG 1983. b. Re-do CABG 1997. c. Grafts patent 2003.  Marland Kitchen. Hyperlipidemia   . Skin rash     of uncertain etiology  . Prostate enlargement     "epididymysis"  . CHF (congestive heart failure)   . Type II diabetes mellitus     controlled  . Shortness of breath     "before first heart surgery" (08/16/2012)  . GERD (gastroesophageal reflux disease)   . Hepatitis     "[employer] quit taking my blood cause they say I had this" (08/16/2012)  . Arthritis     "some, in my calves/legs I guess"  (08/16/2012)   Current Outpatient Prescriptions on File Prior to Visit  Medication Sig Dispense Refill  . alfuzosin (UROXATRAL) 10 MG 24 hr tablet Take 10 mg by mouth daily.        Marland Kitchen. aspirin 81 MG tablet Take 81 mg by mouth daily.      Marland Kitchen. dextrose 5 % SOLN 50 mL with ceFEPIme 1 G SOLR 1 g Inject 1 g into the vein every 12 (twelve) hours.  28 Units  0  . diclofenac (FLECTOR) 1.3 % PTCH Place 1 patch onto the skin 2 (two) times daily.      . diclofenac sodium (VOLTAREN) 1 % GEL Apply 1 application topically daily as needed (for arthritis). Applies to left knee      . docusate sodium (COLACE) 100 MG capsule Take 100 mg by mouth daily.      . ergocalciferol (VITAMIN D2) 50000 UNITS capsule Take 50,000 Units by mouth every 14 (fourteen) days. 1st and 15th of each  month      . ezetimibe-simvastatin (VYTORIN) 10-10 MG per tablet Take 1 tablet by mouth at bedtime.  30 tablet  0  . feeding supplement (ENSURE COMPLETE) LIQD Take 237 mLs by mouth 2 (two) times daily between meals.      . finasteride (PROSCAR) 5 MG tablet Take 5 mg by mouth daily.        . furosemide (LASIX) 10 MG/ML injection Inject 4 mLs (40 mg total) into the vein every 12 (twelve) hours.  4 mL  0  . furosemide (LASIX) 40 MG tablet Take 40 mg by mouth 2 (two) times daily.      Marland Kitchen. glimepiride (AMARYL) 4 MG tablet Take 4 mg by mouth daily.      Marland Kitchen. HYDROcodone-acetaminophen (NORCO/VICODIN) 5-325 MG per tablet Take 2 tablets by mouth every 6 (six) hours as needed for pain.      Marland Kitchen. HYDROcodone-acetaminophen (NORCO/VICODIN) 5-325 MG per tablet Take 1-2 tablets by mouth every 4 (four) hours as needed for pain.  30 tablet  0  . insulin aspart (NOVOLOG) 100 UNIT/ML injection  Inject 0-15 Units into the skin 3 (three) times daily with meals.  1 vial  12  . insulin aspart (NOVOLOG) 100 UNIT/ML injection Inject 0-5 Units into the skin at bedtime.  1 vial  12  . metFORMIN (GLUCOPHAGE) 1000 MG tablet Take 1,000 mg by mouth 2 (two) times daily.      . methocarbamol (ROBAXIN) 500 MG tablet Take 500 mg by mouth at bedtime as needed (for muscle spasms/sleep).       . methocarbamol (ROBAXIN) 500 MG tablet Take 1 tablet (500 mg total) by mouth every 6 (six) hours as needed.  30 tablet  0  . metoprolol succinate (TOPROL-XL) 50 MG 24 hr tablet Take 50 mg by mouth daily. Take with or immediately following a meal.      . omeprazole (PRILOSEC) 20 MG capsule Take 20 mg by mouth daily.      . ondansetron (ZOFRAN) 4 MG tablet Take 4 mg by mouth every 8 (eight) hours as needed (for gas).      . pantoprazole (PROTONIX) 40 MG tablet Take 1 tablet (40 mg total) by mouth daily.      . potassium chloride SA (K-DUR,KLOR-CON) 20 MEQ tablet Take 1 tablet (20 mEq total) by mouth daily.      . traMADol (ULTRAM) 50 MG tablet Take 50 mg  by mouth daily as needed for pain.      Marland Kitchen warfarin (COUMADIN) 2.5 MG tablet Take 1 tablet (2.5 mg total) by mouth daily.      . [DISCONTINUED] glyBURIDE-metformin (GLUCOVANCE) 5-500 MG per tablet Take 2 tablets by mouth 2 (two) times daily with a meal.        No current facility-administered medications on file prior to visit.    LABS REVIEWED;    12-18-12: wbc 9.1; hgb 8.4; hct 28.2; mcv 81.5; plt 314; glucose 110; bun 15; creat 0.6; k+4.5; na++135  01/01/2013 INR 1.9 01/08/2013 INR 1.8 01/15/2013 INR 1.2 01/30/2013 INR 2.6 02/03/2013 INR 2.0 02/12/2013 INR at 1.7, Will  Increase dose to 4.5 mg 02/14/2013, INR at 1.6, Coumadin increased to 5 mg each p.m. 02/17/2013, INR at 1.5, will increase coumadin to 7 mg and recheck on 02/19/2013  Pt with no new c/o, remaining stable.  Assessment/Plan:  Long-term anticoagulant therapy,  INR now sub-therapeutic at 1.5, will increase dose to 7 mg and reassess on 02/19/2013  Pt continues to do well with his re-hab, and taking all food/fluids and meds orally.

## 2013-05-22 NOTE — Progress Notes (Signed)
Date of visit 02/03/2013 [facility] [address] [facility]  Patient ID: Curtis White, male   DOB: 1921-12-18, 80 y.o.   MRN: [id number]  Allergies  Allergen Reactions  . Atorvastatin Other (See Comments)    Muscle weakness   . Pravastatin Sodium Other (See Comments)    Muscle weakness   . Rosuvastatin Other (See Comments)    Muscle weakness    No chief complaint on file.  HPI:  Pt with history of DVT on long-term anticoagulant therapy.  Has been on 4 mg each pm.    Past Medical History  Diagnosis Date  . Mixed hyperlipidemia   . Benign essential HTN   . CAD (coronary artery disease)     a. CABG 1983. b. Re-do CABG 1997. c. Grafts patent 2003.  Marland Kitchen. Hyperlipidemia   . Skin rash     of uncertain etiology  . Prostate enlargement     "epididymysis"  . CHF (congestive heart failure)   . Type II diabetes mellitus     controlled  . Shortness of breath     "before first heart surgery" (08/16/2012)  . GERD (gastroesophageal reflux disease)   . Hepatitis     "[employer] quit taking my blood cause they say I had this" (08/16/2012)  . Arthritis     "some, in my calves/legs I guess"  (08/16/2012)   Current Outpatient Prescriptions on File Prior to Visit  Medication Sig Dispense Refill  . alfuzosin (UROXATRAL) 10 MG 24 hr tablet Take 10 mg by mouth daily.        Marland Kitchen. aspirin 81 MG tablet Take 81 mg by mouth daily.      Marland Kitchen. dextrose 5 % SOLN 50 mL with ceFEPIme 1 G SOLR 1 g Inject 1 g into the vein every 12 (twelve) hours.  28 Units  0  . diclofenac (FLECTOR) 1.3 % PTCH Place 1 patch onto the skin 2 (two) times daily.      . diclofenac sodium (VOLTAREN) 1 % GEL Apply 1 application topically daily as needed (for arthritis). Applies to left knee      . docusate sodium (COLACE) 100 MG capsule Take 100 mg by mouth daily.      . ergocalciferol (VITAMIN D2) 50000 UNITS capsule Take 50,000 Units by mouth every 14 (fourteen) days. 1st and 15th of each month      . ezetimibe-simvastatin (VYTORIN) 10-10 MG per  tablet Take 1 tablet by mouth at bedtime.  30 tablet  0  . feeding supplement (ENSURE COMPLETE) LIQD Take 237 mLs by mouth 2 (two) times daily between meals.      . finasteride (PROSCAR) 5 MG tablet Take 5 mg by mouth daily.        . furosemide (LASIX) 10 MG/ML injection Inject 4 mLs (40 mg total) into the vein every 12 (twelve) hours.  4 mL  0  . furosemide (LASIX) 40 MG tablet Take 40 mg by mouth 2 (two) times daily.      Marland Kitchen. glimepiride (AMARYL) 4 MG tablet Take 4 mg by mouth daily.      Marland Kitchen. HYDROcodone-acetaminophen (NORCO/VICODIN) 5-325 MG per tablet Take 2 tablets by mouth every 6 (six) hours as needed for pain.      Marland Kitchen. HYDROcodone-acetaminophen (NORCO/VICODIN) 5-325 MG per tablet Take 1-2 tablets by mouth every 4 (four) hours as needed for pain.  30 tablet  0  . insulin aspart (NOVOLOG) 100 UNIT/ML injection Inject 0-15 Units into the skin 3 (three) times  daily with meals.  1 vial  12  . insulin aspart (NOVOLOG) 100 UNIT/ML injection Inject 0-5 Units into the skin at bedtime.  1 vial  12  . metFORMIN (GLUCOPHAGE) 1000 MG tablet Take 1,000 mg by mouth 2 (two) times daily.      . methocarbamol (ROBAXIN) 500 MG tablet Take 500 mg by mouth at bedtime as needed (for muscle spasms/sleep).       . methocarbamol (ROBAXIN) 500 MG tablet Take 1 tablet (500 mg total) by mouth every 6 (six) hours as needed.  30 tablet  0  . metoprolol succinate (TOPROL-XL) 50 MG 24 hr tablet Take 50 mg by mouth daily. Take with or immediately following a meal.      . omeprazole (PRILOSEC) 20 MG capsule Take 20 mg by mouth daily.      . ondansetron (ZOFRAN) 4 MG tablet Take 4 mg by mouth every 8 (eight) hours as needed (for gas).      . pantoprazole (PROTONIX) 40 MG tablet Take 1 tablet (40 mg total) by mouth daily.      . potassium chloride SA (K-DUR,KLOR-CON) 20 MEQ tablet Take 1 tablet (20 mEq total) by mouth daily.      . traMADol (ULTRAM) 50 MG tablet Take 50 mg by mouth daily as needed for pain.      Marland Kitchen warfarin  (COUMADIN) 2.5 MG tablet Take 1 tablet (2.5 mg total) by mouth daily.      . [DISCONTINUED] glyBURIDE-metformin (GLUCOVANCE) 5-500 MG per tablet Take 2 tablets by mouth 2 (two) times daily with a meal.        No current facility-administered medications on file prior to visit.    LABS REVIEWED;    12-18-12: wbc 9.1; hgb 8.4; hct 28.2; mcv 81.5; plt 314; glucose 110; bun 15; creat 0.6; k+4.5; na++135  01/01/2013 INR 1.9 01/08/2013 INR 1.8 01/15/2013 INR 1.2 01/30/2013 INR 2.6 02/03/2013 INR 2.0  Pt with no new c/o, remaining stable.  Assessment/Plan:  Long-term anticoagulant therapy,  INR now within therapeutic range. We'll continue Coumadin at 4 mg each evening. We will reassess  within one week.

## 2013-05-22 NOTE — Progress Notes (Signed)
Date of visit [facility]/12/2013 [facility]  Patient ID: Curtis White, male   DOB: 09/03/1921, 80 y.o.   MRN: [id number]  Allergies  Allergen Reactions  . Atorvastatin Other (See Comments)    Muscle weakness   . Pravastatin Sodium Other (See Comments)    Muscle weakness   . Rosuvastatin Other (See Comments)    Muscle weakness    Chief Complaint  Patient presents with  . Acute Visit   HPI:  Pt with history of DVT on long-term anticoagulant therapy.  Has been on 4 mg each pm.    Past Medical History  Diagnosis Date  . Mixed hyperlipidemia   . Benign essential HTN   . CAD (coronary artery disease)     a. CABG 1983. b. Re-do CABG 1997. c. Grafts patent 2003.  Marland Kitchen. Hyperlipidemia   . Skin rash     of uncertain etiology  . Prostate enlargement     "epididymysis"  . CHF (congestive heart failure)   . Type II diabetes mellitus     controlled  . Shortness of breath     "before first heart surgery" (08/16/2012)  . GERD (gastroesophageal reflux disease)   . Hepatitis     "[employer] quit taking my blood cause they say I had this" (08/16/2012)  . Arthritis     "some, in my calves/legs I guess"  (08/16/2012)   Current Outpatient Prescriptions on File Prior to Visit  Medication Sig Dispense Refill  . alfuzosin (UROXATRAL) 10 MG 24 hr tablet Take 10 mg by mouth daily.        Marland Kitchen. aspirin 81 MG tablet Take 81 mg by mouth daily.      Marland Kitchen. dextrose 5 % SOLN 50 mL with ceFEPIme 1 G SOLR 1 g Inject 1 g into the vein every 12 (twelve) hours.  28 Units  0  . diclofenac (FLECTOR) 1.3 % PTCH Place 1 patch onto the skin 2 (two) times daily.      . diclofenac sodium (VOLTAREN) 1 % GEL Apply 1 application topically daily as needed (for arthritis). Applies to left knee      . docusate sodium (COLACE) 100 MG capsule Take 100 mg by mouth daily.      . ergocalciferol (VITAMIN D2) 50000 UNITS capsule Take 50,000 Units by mouth every 14 (fourteen) days. 1st and 15th of each month      .  ezetimibe-simvastatin (VYTORIN) 10-10 MG per tablet Take 1 tablet by mouth at bedtime.  30 tablet  0  . feeding supplement (ENSURE COMPLETE) LIQD Take 237 mLs by mouth 2 (two) times daily between meals.      . finasteride (PROSCAR) 5 MG tablet Take 5 mg by mouth daily.        . furosemide (LASIX) 10 MG/ML injection Inject 4 mLs (40 mg total) into the vein every 12 (twelve) hours.  4 mL  0  . furosemide (LASIX) 40 MG tablet Take 40 mg by mouth 2 (two) times daily.      Marland Kitchen. glimepiride (AMARYL) 4 MG tablet Take 4 mg by mouth daily.      Marland Kitchen. HYDROcodone-acetaminophen (NORCO/VICODIN) 5-325 MG per tablet Take 2 tablets by mouth every 6 (six) hours as needed for pain.      Marland Kitchen. HYDROcodone-acetaminophen (NORCO/VICODIN) 5-325 MG per tablet Take 1-2 tablets by mouth every 4 (four) hours as needed for pain.  30 tablet  0  . insulin aspart (NOVOLOG) 100 UNIT/ML injection Inject 0-15  Units into the skin 3 (three) times daily with meals.  1 vial  12  . insulin aspart (NOVOLOG) 100 UNIT/ML injection Inject 0-5 Units into the skin at bedtime.  1 vial  12  . metFORMIN (GLUCOPHAGE) 1000 MG tablet Take 1,000 mg by mouth 2 (two) times daily.      . methocarbamol (ROBAXIN) 500 MG tablet Take 500 mg by mouth at bedtime as needed (for muscle spasms/sleep).       . methocarbamol (ROBAXIN) 500 MG tablet Take 1 tablet (500 mg total) by mouth every 6 (six) hours as needed.  30 tablet  0  . metoprolol succinate (TOPROL-XL) 50 MG 24 hr tablet Take 50 mg by mouth daily. Take with or immediately following a meal.      . omeprazole (PRILOSEC) 20 MG capsule Take 20 mg by mouth daily.      . ondansetron (ZOFRAN) 4 MG tablet Take 4 mg by mouth every 8 (eight) hours as needed (for gas).      . pantoprazole (PROTONIX) 40 MG tablet Take 1 tablet (40 mg total) by mouth daily.      . potassium chloride SA (K-DUR,KLOR-CON) 20 MEQ tablet Take 1 tablet (20 mEq total) by mouth daily.      . traMADol (ULTRAM) 50 MG tablet Take 50 mg by mouth daily  as needed for pain.      Marland Kitchen warfarin (COUMADIN) 2.5 MG tablet Take 1 tablet (2.5 mg total) by mouth daily.      . [DISCONTINUED] glyBURIDE-metformin (GLUCOVANCE) 5-500 MG per tablet Take 2 tablets by mouth 2 (two) times daily with a meal.        No current facility-administered medications on file prior to visit.    LABS REVIEWED;    12-18-12: wbc 9.1; hgb 8.4; hct 28.2; mcv 81.5; plt 314; glucose 110; bun 15; creat 0.6; k+4.5; na++135  01/01/2013 INR 1.9 01/08/2013 INR 1.8 01/15/2013 INR 1.2 01/30/2013 INR 2.6 02/03/2013 INR 2.0 02/12/2013 INR at 1.7, Will  Increase dose to 4.5 mg  Pt with no new c/o, remaining stable.  Assessment/Plan:  Long-term anticoagulant therapy,  INR now sub-therapeutic at 1.7,  Will increase dose to 4.5mg  and reassess on Friday, 02/16/2013

## 2013-06-03 NOTE — Progress Notes (Signed)
Date of visit 01/29/2013  [facility]  Patient ID: Curtis KiltsJohn W Kreger, male   DOB: 1921-05-27, 80 y.o.   MRN: [id number]  Allergies  Allergen Reactions  . Atorvastatin Other (See Comments)    Muscle weakness   . Pravastatin Sodium Other (See Comments)    Muscle weakness   . Rosuvastatin Other (See Comments)    Muscle weakness    Chief Complaint  Patient presents with  . Acute Visit   HPI:  Pt with history of DVT on long-term anticoagulant therapy.  Has been on 2.5 mg each pm.   Pt is now going on outings with his family.  Review of Systems He verbalizes no new c/o  Past Medical History  Diagnosis Date  . Mixed hyperlipidemia   . Benign essential HTN   . CAD (coronary artery disease)     a. CABG 1983. b. Re-do CABG 1997. c. Grafts patent 2003.  Marland Kitchen. Hyperlipidemia   . Skin rash     of uncertain etiology  . Prostate enlargement     "epididymysis"  . CHF (congestive heart failure)   . Type II diabetes mellitus     controlled  . Shortness of breath     "before first heart surgery" (08/16/2012)  . GERD (gastroesophageal reflux disease)   . Hepatitis     "[employer] quit taking my blood cause they say I had this" (08/16/2012)  . Arthritis     "some, in my calves/legs I guess"  (08/16/2012)   Current Outpatient Prescriptions on File Prior to Visit  Medication Sig Dispense Refill  . alfuzosin (UROXATRAL) 10 MG 24 hr tablet Take 10 mg by mouth daily.        Marland Kitchen. aspirin 81 MG tablet Take 81 mg by mouth daily.      Marland Kitchen. dextrose 5 % SOLN 50 mL with ceFEPIme 1 G SOLR 1 g Inject 1 g into the vein every 12 (twelve) hours.  28 Units  0  . diclofenac (FLECTOR) 1.3 % PTCH Place 1 patch onto the skin 2 (two) times daily.      . diclofenac sodium (VOLTAREN) 1 % GEL Apply 1 application topically daily as needed (for arthritis). Applies to left knee      . docusate sodium (COLACE) 100 MG capsule Take 100 mg by mouth daily.      . ergocalciferol (VITAMIN D2) 50000 UNITS capsule Take 50,000  Units by mouth every 14 (fourteen) days. 1st and 15th of each month      . ezetimibe-simvastatin (VYTORIN) 10-10 MG per tablet Take 1 tablet by mouth at bedtime.  30 tablet  0  . feeding supplement (ENSURE COMPLETE) LIQD Take 237 mLs by mouth 2 (two) times daily between meals.      . finasteride (PROSCAR) 5 MG tablet Take 5 mg by mouth daily.        . furosemide (LASIX) 10 MG/ML injection Inject 4 mLs (40 mg total) into the vein every 12 (twelve) hours.  4 mL  0  . furosemide (LASIX) 40 MG tablet Take 40 mg by mouth 2 (two) times daily.      Marland Kitchen. glimepiride (AMARYL) 4 MG tablet Take 4 mg by mouth daily.      Marland Kitchen. HYDROcodone-acetaminophen (NORCO/VICODIN) 5-325 MG per tablet Take 2 tablets by mouth every 6 (six) hours as needed for pain.      Marland Kitchen. HYDROcodone-acetaminophen (NORCO/VICODIN) 5-325 MG per tablet Take 1-2 tablets by mouth every 4 (four) hours as needed for  pain.  30 tablet  0  . insulin aspart (NOVOLOG) 100 UNIT/ML injection Inject 0-15 Units into the skin 3 (three) times daily with meals.  1 vial  12  . insulin aspart (NOVOLOG) 100 UNIT/ML injection Inject 0-5 Units into the skin at bedtime.  1 vial  12  . metFORMIN (GLUCOPHAGE) 1000 MG tablet Take 1,000 mg by mouth 2 (two) times daily.      . methocarbamol (ROBAXIN) 500 MG tablet Take 500 mg by mouth at bedtime as needed (for muscle spasms/sleep).       . methocarbamol (ROBAXIN) 500 MG tablet Take 1 tablet (500 mg total) by mouth every 6 (six) hours as needed.  30 tablet  0  . metoprolol succinate (TOPROL-XL) 50 MG 24 hr tablet Take 50 mg by mouth daily. Take with or immediately following a meal.      . omeprazole (PRILOSEC) 20 MG capsule Take 20 mg by mouth daily.      . ondansetron (ZOFRAN) 4 MG tablet Take 4 mg by mouth every 8 (eight) hours as needed (for gas).      . pantoprazole (PROTONIX) 40 MG tablet Take 1 tablet (40 mg total) by mouth daily.      . potassium chloride SA (K-DUR,KLOR-CON) 20 MEQ tablet Take 1 tablet (20 mEq total) by  mouth daily.      . traMADol (ULTRAM) 50 MG tablet Take 50 mg by mouth daily as needed for pain.      Marland Kitchen warfarin (COUMADIN) 2.5 MG tablet Take 1 tablet (2.5 mg total) by mouth daily.      . [DISCONTINUED] glyBURIDE-metformin (GLUCOVANCE) 5-500 MG per tablet Take 2 tablets by mouth 2 (two) times daily with a meal.        No current facility-administered medications on file prior to visit.    LABS REVIEWED;    Labs from 01/28/2013 WBC 8.1 RBC 3.7 Hemoglobin 9.8 Hematocrit 31.8 MCV 86.4 MCH 26.6 MCHC 30.8 RDW 19.3  Hemoglobin A1c 6.5  Cholesterol 76 HDL 31 LDL 24 VLDL 20.6 Triglycerides 103  12-18-12: wbc 9.1; hgb 8.4; hct 28.2; mcv 81.5; plt 314; glucose 110; bun 15; creat 0.6; k+4.5; na++135  01/01/2013 INR 1.9 01/08/2013 INR 1.8 01/15/2013 INR 1.2 01/22/2013 INR 2.4 01/29/2013 INR 3.4  Patient has been on Coumadin at 5 mg.  The patient remained stable, no evidence of any bleeding. As per his lab results his hemoglobin and hematocrit has increased.  Assessment/Plan:  We'll hold tonight's dose.  Will reassess on 918, at 4:30 PM.  Dosage will be adjusted as appropriate.  His glyburide metformin has been discontinued, as his diabetes is under excellent control. He would be at significant risk for episodic hypoglycemia, with the continuation of these medicines.

## 2013-09-08 ENCOUNTER — Encounter: Payer: Self-pay | Admitting: Cardiovascular Disease

## 2013-09-08 ENCOUNTER — Telehealth: Payer: Self-pay | Admitting: *Deleted

## 2013-09-08 ENCOUNTER — Ambulatory Visit (INDEPENDENT_AMBULATORY_CARE_PROVIDER_SITE_OTHER): Payer: Medicare Other | Admitting: Cardiovascular Disease

## 2013-09-08 VITALS — BP 108/61 | HR 95 | Ht 70.0 in | Wt 166.0 lb

## 2013-09-08 DIAGNOSIS — I82409 Acute embolism and thrombosis of unspecified deep veins of unspecified lower extremity: Secondary | ICD-10-CM

## 2013-09-08 DIAGNOSIS — I2510 Atherosclerotic heart disease of native coronary artery without angina pectoris: Secondary | ICD-10-CM

## 2013-09-08 LAB — BASIC METABOLIC PANEL
BUN: 26 mg/dL — AB (ref 6–23)
CO2: 25 mEq/L (ref 19–32)
CREATININE: 0.7 mg/dL (ref 0.4–1.5)
Calcium: 8.9 mg/dL (ref 8.4–10.5)
Chloride: 106 mEq/L (ref 96–112)
GFR: 105.64 mL/min (ref >60.00)
Glucose, Bld: 148 mg/dL — ABNORMAL HIGH (ref 70–99)
POTASSIUM: 4.5 meq/L (ref 3.5–5.1)
SODIUM: 139 meq/L (ref 135–145)

## 2013-09-08 LAB — APTT: aPTT: 43.9 s — ABNORMAL HIGH (ref 21.7–28.8)

## 2013-09-08 LAB — CBC WITH DIFFERENTIAL/PLATELET
BASOS PCT: 0.3 % (ref 0.0–3.0)
Basophils Absolute: 0 10*3/uL (ref 0.0–0.1)
EOS ABS: 0.1 10*3/uL (ref 0.0–0.7)
Eosinophils Relative: 1.6 % (ref 0.0–5.0)
HCT: 24.9 % — ABNORMAL LOW (ref 39.0–52.0)
Hemoglobin: 8 g/dL — CL (ref 13.0–17.0)
Lymphocytes Relative: 13.9 % (ref 12.0–46.0)
Lymphs Abs: 0.9 10*3/uL (ref 0.7–4.0)
MCHC: 32.3 g/dL (ref 30.0–36.0)
MCV: 75.5 fl — AB (ref 78.0–100.0)
MONO ABS: 0.4 10*3/uL (ref 0.1–1.0)
Monocytes Relative: 5.6 % (ref 3.0–12.0)
NEUTROS PCT: 78.6 % — AB (ref 43.0–77.0)
Neutro Abs: 5 10*3/uL (ref 1.4–7.7)
PLATELETS: 246 10*3/uL (ref 150.0–400.0)
RBC: 3.29 Mil/uL — ABNORMAL LOW (ref 4.22–5.81)
RDW: 20.3 % — ABNORMAL HIGH (ref 11.5–14.6)
WBC: 6.3 10*3/uL (ref 4.5–10.5)

## 2013-09-08 LAB — PROTIME-INR
INR: 2.6 ratio — AB (ref 0.8–1.0)
PROTHROMBIN TIME: 28.4 s — AB (ref 9.6–13.1)

## 2013-09-08 NOTE — Assessment & Plan Note (Signed)
Not clear that he still needs to be on anticoagulation and given low weight and age 80 mg dose may be more appr  Needs BMET/CBC  F/U venous duplex and will decide on duration of Rx

## 2013-09-08 NOTE — Telephone Encounter (Signed)
LEFT  MESSAGE  FOR  DAUGHTER  TO CALL  BACK  RE   HGB  OF  8 PER  DR NISHAN  STOP  XARELTO  AND   F/U WITH PMD   ALSO  TRIED HOME   NUMBER   AND PHON  JUST  RINGS  NO ANSWER  WILL TRY  AGAIN  LATER .Zack Seal/CY

## 2013-09-08 NOTE — Assessment & Plan Note (Signed)
Stable with no angina and good activity level.  Continue medical Rx  

## 2013-09-08 NOTE — Assessment & Plan Note (Signed)
Well controlled.  Continue current medications and low sodium Dash type diet.    

## 2013-09-08 NOTE — Patient Instructions (Signed)
Your physician wants you to follow-up in:    6 months with  DR Haywood FillerNISHAN  You will receive a reminder letter in the mail two months in advance. If you don't receive a letter, please call our office to schedule the follow-up appointment. Your physician recommends that you continue on your current medications as directed. Please refer to the Current Medication list given to you today.  Your physician has requested that you have a lower or upper extremity venous duplex. This test is an ultrasound of the veins in the legs or arms. It looks at venous blood flow that carries blood from the heart to the legs or arms. Allow one hour for a Lower Venous exam. Allow thirty minutes for an Upper Venous exam. There are no restrictions or special instructions.  LOWER   Your physician recommends that you return for lab work in:  TODAY   CBC   BMET   PT  PTT

## 2013-09-08 NOTE — Assessment & Plan Note (Signed)
Euvolemic continue current dose of lasix

## 2013-09-08 NOTE — Progress Notes (Signed)
Patient ID: Curtis KiltsJohn W White, male   DOB: [birth year], 80 y.o.   MRN: [id number] The patient is 80 years old and return for management of CAD. He had bypass surgery in 1983 and redo bypass surgery in 1997. In 2003 all 3 grafts were patent.  He's done quite well over the past year with no chest pain shortness of breath or palpitations.  His other problems include hypertension hyperlipidemia and diabetes.  His cholesterol levels in the past have been quite good with HDL of 43 and an LDL of 55 by Dr. Jeannetta NapElkins.  His wife of 62 years was with him today Had uncomplicated hernia surgery 3/12  I reviewed his myovue from 07/04/10 and he has an old anterior MI with no ischemia and EF 41%   Lost of issues with left knee.  Been at rehab  Not very ambulatory  Had DVT;s  7/31 bilateral   Summary:  - Findings consistent with acute deep vein thrombosis involving popliteal vein of the right lower extremity. - Findings consistent with acute deep vein thrombosis involving the politeal and femoral veins of theleft lower extremity. - Bilateral - No   Initially on coumadin now xarelto full dose     ROS: Denies fever, malais, weight loss, blurry vision, decreased visual acuity, cough, sputum, SOB, hemoptysis, pleuritic pain, palpitaitons, heartburn, abdominal pain, melena, lower extremity edema, claudication, or rash.  All other systems reviewed and negative  General: Affect appropriate Frail elderly male  HEENT: normal Neck supple with no adenopathy JVP normal no bruits no thyromegaly Lungs clear with no wheezing and good diaphragmatic motion Heart:  S1/S2 no murmur, no rub, gallop or click PMI normal Abdomen: benighn, BS positve, no tenderness, no AAA no bruit.  No HSM or HJR Distal pulses intact with no bruits Plus one bilateral edema Neuro non-focal Skin warm and dry No muscular weakness   Current Outpatient Prescriptions  Medication Sig Dispense Refill  . alfuzosin (UROXATRAL) 10 MG 24 hr tablet  Take 10 mg by mouth daily.        . diclofenac (FLECTOR) 1.3 % PTCH Place 1 patch onto the skin 2 (two) times daily.      . ergocalciferol (VITAMIN D2) 50000 UNITS capsule Take 50,000 Units by mouth every 14 (fourteen) days. 1st and 15th of each month      . ezetimibe-simvastatin (VYTORIN) 10-10 MG per tablet Take 1 tablet by mouth at bedtime.  30 tablet  0  . finasteride (PROSCAR) 5 MG tablet Take 5 mg by mouth daily.        . furosemide (LASIX) 10 MG/ML injection Inject 4 mLs (40 mg total) into the vein every 12 (twelve) hours.  4 mL  0  . glimepiride (AMARYL) 4 MG tablet Take 4 mg by mouth daily.      . metFORMIN (GLUCOPHAGE) 1000 MG tablet Take 1,000 mg by mouth 2 (two) times daily.      . ondansetron (ZOFRAN) 4 MG tablet Take 4 mg by mouth every 8 (eight) hours as needed (for gas).      . potassium chloride SA (K-DUR,KLOR-CON) 20 MEQ tablet Take 1 tablet (20 mEq total) by mouth daily.      . rivaroxaban (XARELTO) 20 MG TABS tablet Take 20 mg by mouth daily with supper.      . [DISCONTINUED] glyBURIDE-metformin (GLUCOVANCE) 5-500 MG per tablet Take 2 tablets by mouth 2 (two) times daily with a meal.        No current facility-administered medications for  this visit.    Allergies  Atorvastatin; Pravastatin sodium; and Rosuvastatin  Electrocardiogram: 08/02/12  SR rate 80 ICRBBB nonspecific ST/T wave changes  Today SR rate 93 LAD old IMI poor R wave progression   Assessment and Plan

## 2013-09-09 ENCOUNTER — Other Ambulatory Visit: Payer: Self-pay | Admitting: *Deleted

## 2013-09-09 ENCOUNTER — Other Ambulatory Visit: Payer: Self-pay

## 2013-09-09 MED ORDER — FUROSEMIDE 40 MG PO TABS
40.0000 mg | ORAL_TABLET | Freq: Two times a day (BID) | ORAL | Status: DC
Start: 2013-09-09 — End: 2014-08-17

## 2013-09-09 MED ORDER — POTASSIUM CHLORIDE CRYS ER 20 MEQ PO TBCR: 20.0000 meq | EXTENDED_RELEASE_TABLET | Freq: Every day | ORAL | Status: DC

## 2013-09-16 ENCOUNTER — Ambulatory Visit (HOSPITAL_COMMUNITY): Payer: Medicare Other | Attending: Cardiovascular Disease | Admitting: Cardiology

## 2013-09-16 DIAGNOSIS — I87009 Postthrombotic syndrome without complications of unspecified extremity: Secondary | ICD-10-CM

## 2013-09-16 DIAGNOSIS — E785 Hyperlipidemia, unspecified: Secondary | ICD-10-CM | POA: Insufficient documentation

## 2013-09-16 DIAGNOSIS — I2510 Atherosclerotic heart disease of native coronary artery without angina pectoris: Secondary | ICD-10-CM

## 2013-09-16 DIAGNOSIS — M7989 Other specified soft tissue disorders: Secondary | ICD-10-CM

## 2013-09-16 DIAGNOSIS — I10 Essential (primary) hypertension: Secondary | ICD-10-CM | POA: Insufficient documentation

## 2013-09-16 DIAGNOSIS — M79609 Pain in unspecified limb: Secondary | ICD-10-CM

## 2013-09-16 DIAGNOSIS — Z86718 Personal history of other venous thrombosis and embolism: Secondary | ICD-10-CM | POA: Insufficient documentation

## 2013-09-16 DIAGNOSIS — I509 Heart failure, unspecified: Secondary | ICD-10-CM | POA: Insufficient documentation

## 2013-09-16 DIAGNOSIS — I82819 Embolism and thrombosis of superficial veins of unspecified lower extremities: Secondary | ICD-10-CM | POA: Insufficient documentation

## 2013-09-16 DIAGNOSIS — E119 Type 2 diabetes mellitus without complications: Secondary | ICD-10-CM | POA: Insufficient documentation

## 2013-09-16 NOTE — Progress Notes (Signed)
Bilateral lower extremity venous duplex completed 

## 2013-09-19 NOTE — Telephone Encounter (Signed)
SPOKE WITH  DAUGHTER   TODAY   AND AT   EARLIER DATE   PT HAS   STOPPED   XARELTO  DAYS  AGO  HAS NOT  FOLLOWED  UP  WITH PMD  AT THIS  TIME  FAMILY HAS   HAD STOMACH  BUG ONCE    STOMACH  BUG  HAS BEEN   ELIMINATED WILL FOLLOW UP  ON  LOW  HGB ./CY

## 2013-11-03 ENCOUNTER — Other Ambulatory Visit: Payer: Medicare Other

## 2014-06-02 ENCOUNTER — Other Ambulatory Visit: Payer: Self-pay

## 2014-06-02 MED ORDER — POTASSIUM CHLORIDE CRYS ER 20 MEQ PO TBCR: 20.0000 meq | EXTENDED_RELEASE_TABLET | Freq: Every day | ORAL | Status: DC

## 2014-08-17 ENCOUNTER — Other Ambulatory Visit: Payer: Self-pay | Admitting: Cardiovascular Disease

## 2014-09-23 NOTE — Progress Notes (Signed)
Patient ID: Curtis White, male   DOB: 1922/04/18, 81 y.o.   MRN: [id number] The patient is 81 y.o.  and return for management of CAD. He had bypass surgery in 1983 and redo bypass surgery in 1997. In 2003 all 3 grafts were patent.  He's done quite well over the past year with no chest pain shortness of breath or palpitations.   His other problems include hypertension hyperlipidemia and diabetes.  His cholesterol levels in the past have been quite good with HDL of 43 and an LDL of 55 by Dr. Jeannetta NapElkins.   His wife of 62 years was with him today Had uncomplicated hernia surgery 3/12  I reviewed his myovue from 07/04/10 and he has an old anterior MI with no ischemia and EF 41%   Lost of issues with left knee.  Been at rehab  Not very ambulatory  Had DVT;s  7/31 bilateral   Summary:  - Findings consistent with acute deep vein thrombosis involving popliteal vein of the right lower extremity. - Findings consistent with acute deep vein thrombosis involving the politeal and femoral veins of theleft lower extremity. - Bilateral - No   Initially on coumadin now xarelto full dose  09/17/13  Duplex with no DVT and given age anticoagulation was stopped  Wife passed in January Depressed and wheel chair bound Daughter living with him     ROS: Denies fever, malais, weight loss, blurry vision, decreased visual acuity, cough, sputum, SOB, hemoptysis, pleuritic pain, palpitaitons, heartburn, abdominal pain, melena, lower extremity edema, claudication, or rash.  All other systems reviewed and negative  General: Affect appropriate Frail elderly male  HEENT: normal Neck supple with no adenopathy JVP normal no bruits no thyromegaly Lungs clear with no wheezing and good diaphragmatic motion Heart:  S1/S2 MR  murmur, no rub, gallop or click PMI normal Abdomen: benighn, BS positve, no tenderness, no AAA no bruit.  No HSM or HJR Distal pulses intact with no bruits Plus one bilateral edema worse on left   Neuro non-focal Skin warm and dry No muscular weakness   Current Outpatient Prescriptions  Medication Sig Dispense Refill  . alfuzosin (UROXATRAL) 10 MG 24 hr tablet Take 10 mg by mouth daily.      . celecoxib (CELEBREX) 100 MG capsule Take 100 mg by mouth daily.    . diclofenac (FLECTOR) 1.3 % PTCH Place 1 patch onto the skin 2 (two) times daily.    Marland Kitchen. ezetimibe-simvastatin (VYTORIN) 10-10 MG per tablet Take 1 tablet by mouth at bedtime. 30 tablet 0  . finasteride (PROSCAR) 5 MG tablet Take 5 mg by mouth daily.      Marland Kitchen. FLUoxetine (PROZAC) 10 MG capsule Take 10 mg by mouth daily.    . furosemide (LASIX) 40 MG tablet TAKE ONE TABLET BY MOUTH TWICE DAILY 180 tablet 0  . metFORMIN (GLUCOPHAGE) 1000 MG tablet Take 1,000 mg by mouth 2 (two) times daily.    . ondansetron (ZOFRAN) 4 MG tablet Take 4 mg by mouth every 8 (eight) hours as needed (for gas).    . potassium chloride SA (K-DUR,KLOR-CON) 20 MEQ tablet Take 1 tablet (20 mEq total) by mouth daily. 30 tablet 3  . traMADol (ULTRAM) 50 MG tablet Take 50 mg by mouth daily.    . [DISCONTINUED] glyBURIDE-metformin (GLUCOVANCE) 5-500 MG per tablet Take 2 tablets by mouth 2 (two) times daily with a meal.      No current facility-administered medications for this visit.    Allergies  Atorvastatin;  Pravastatin sodium; and Rosuvastatin  Electrocardiogram: 08/02/12  SR rate 80 ICRBBB nonspecific ST/T wave changes  Today SR rate 93 LAD old IMI poor R wave progression  09/25/14  afib rate 109  LAD low voltage PVC   Assessment and Plan Afib:  Anticoagulation stopped due to age and frailty  Start atenolol 25 mg for rate control Depression:  Started on prosac in April daughter is helpful Edema:  Venous disease on left Compression stocking  Decrease diuretic and stop K to make room for rate control drugs  BMET with Jeannetta NapElkins in 3 weeks Prostate:  Hypertrophy continue proscar  No retension  F/u with me next available

## 2014-09-25 ENCOUNTER — Encounter: Payer: Self-pay | Admitting: Cardiovascular Disease

## 2014-09-25 ENCOUNTER — Ambulatory Visit (INDEPENDENT_AMBULATORY_CARE_PROVIDER_SITE_OTHER): Payer: Medicare Other | Admitting: Cardiovascular Disease

## 2014-09-25 VITALS — BP 94/61 | HR 80 | Ht 70.0 in | Wt 139.0 lb

## 2014-09-25 DIAGNOSIS — I2510 Atherosclerotic heart disease of native coronary artery without angina pectoris: Secondary | ICD-10-CM

## 2014-09-25 MED ORDER — ATENOLOL 25 MG PO TABS: 25.0000 mg | ORAL_TABLET | Freq: Every day | ORAL | Status: AC

## 2014-09-25 MED ORDER — FUROSEMIDE 40 MG PO TABS: 20.0000 mg | ORAL_TABLET | Freq: Every day | ORAL | Status: AC

## 2014-09-25 NOTE — Patient Instructions (Addendum)
Medication Instructions:  Your physician has recommended you make the following change in your medication: STOP POTASSIUM DECREASE  FUROSEMIDE  TO  1/2 TAB  DAILY  START ATENOLOL  25 MG EVERY DAY   Labwork: BMET  IN  3 WEEKS  AT  DR Jeannetta NapELKINS OFFICE  Testing/Procedures: NONE  Follow-Up:Your physician recommends that you schedule a follow-up appointment in: NEXT AVAILABLE WITH  DR Eden EmmsNISHAN Any Other Special Instructions Will Be Listed Below (If Applicable).

## 2014-10-02 ENCOUNTER — Emergency Department (HOSPITAL_COMMUNITY)
Admission: EM | Admit: 2014-10-02 | Discharge: 2014-10-14 | Disposition: E | Payer: Medicare Other | Attending: Emergency Medicine | Admitting: Emergency Medicine

## 2014-10-02 ENCOUNTER — Emergency Department (HOSPITAL_COMMUNITY): Payer: Medicare Other

## 2014-10-02 ENCOUNTER — Encounter (HOSPITAL_COMMUNITY): Payer: Self-pay | Admitting: *Deleted

## 2014-10-02 DIAGNOSIS — R680 Hypothermia, not associated with low environmental temperature: Secondary | ICD-10-CM | POA: Insufficient documentation

## 2014-10-02 DIAGNOSIS — E782 Mixed hyperlipidemia: Secondary | ICD-10-CM | POA: Diagnosis not present

## 2014-10-02 DIAGNOSIS — R579 Shock, unspecified: Secondary | ICD-10-CM

## 2014-10-02 DIAGNOSIS — Z79899 Other long term (current) drug therapy: Secondary | ICD-10-CM | POA: Insufficient documentation

## 2014-10-02 DIAGNOSIS — Z951 Presence of aortocoronary bypass graft: Secondary | ICD-10-CM | POA: Insufficient documentation

## 2014-10-02 DIAGNOSIS — N179 Acute kidney failure, unspecified: Secondary | ICD-10-CM

## 2014-10-02 DIAGNOSIS — I10 Essential (primary) hypertension: Secondary | ICD-10-CM | POA: Insufficient documentation

## 2014-10-02 DIAGNOSIS — E872 Acidosis, unspecified: Secondary | ICD-10-CM

## 2014-10-02 DIAGNOSIS — R0902 Hypoxemia: Secondary | ICD-10-CM | POA: Insufficient documentation

## 2014-10-02 DIAGNOSIS — Z8719 Personal history of other diseases of the digestive system: Secondary | ICD-10-CM | POA: Diagnosis not present

## 2014-10-02 DIAGNOSIS — Z791 Long term (current) use of non-steroidal anti-inflammatories (NSAID): Secondary | ICD-10-CM | POA: Diagnosis not present

## 2014-10-02 DIAGNOSIS — F329 Major depressive disorder, single episode, unspecified: Secondary | ICD-10-CM | POA: Diagnosis not present

## 2014-10-02 DIAGNOSIS — M1990 Unspecified osteoarthritis, unspecified site: Secondary | ICD-10-CM | POA: Insufficient documentation

## 2014-10-02 DIAGNOSIS — I2510 Atherosclerotic heart disease of native coronary artery without angina pectoris: Secondary | ICD-10-CM | POA: Insufficient documentation

## 2014-10-02 DIAGNOSIS — N400 Enlarged prostate without lower urinary tract symptoms: Secondary | ICD-10-CM | POA: Diagnosis not present

## 2014-10-02 DIAGNOSIS — Z87891 Personal history of nicotine dependence: Secondary | ICD-10-CM | POA: Insufficient documentation

## 2014-10-02 DIAGNOSIS — I959 Hypotension, unspecified: Secondary | ICD-10-CM | POA: Diagnosis not present

## 2014-10-02 DIAGNOSIS — I509 Heart failure, unspecified: Secondary | ICD-10-CM | POA: Insufficient documentation

## 2014-10-02 DIAGNOSIS — E119 Type 2 diabetes mellitus without complications: Secondary | ICD-10-CM | POA: Diagnosis not present

## 2014-10-02 LAB — PROTIME-INR
INR: 1.71 — ABNORMAL HIGH (ref 0.00–1.49)
Prothrombin Time: 20.1 seconds — ABNORMAL HIGH (ref 11.6–15.2)

## 2014-10-02 LAB — URINE MICROSCOPIC-ADD ON

## 2014-10-02 LAB — I-STAT CHEM 8, ED
BUN: 69 mg/dL — ABNORMAL HIGH (ref 6–20)
CREATININE: 4.8 mg/dL — AB (ref 0.61–1.24)
Calcium, Ion: 0.86 mmol/L — ABNORMAL LOW (ref 1.13–1.30)
Chloride: 103 mmol/L (ref 101–111)
Glucose, Bld: 135 mg/dL — ABNORMAL HIGH (ref 65–99)
HCT: 42 % (ref 39.0–52.0)
HEMOGLOBIN: 14.3 g/dL (ref 13.0–17.0)
Potassium: 5.2 mmol/L — ABNORMAL HIGH (ref 3.5–5.1)
Sodium: 137 mmol/L (ref 135–145)
TCO2: 5 mmol/L (ref 0–100)

## 2014-10-02 LAB — COMPREHENSIVE METABOLIC PANEL
ALK PHOS: 57 U/L (ref 38–126)
ALT: 13 U/L — ABNORMAL LOW (ref 17–63)
AST: 26 U/L (ref 15–41)
Albumin: 3.4 g/dL — ABNORMAL LOW (ref 3.5–5.0)
Anion gap: 40 — ABNORMAL HIGH (ref 5–15)
BILIRUBIN TOTAL: 1.2 mg/dL (ref 0.3–1.2)
BUN: 61 mg/dL — AB (ref 6–20)
CO2: 6 mmol/L — ABNORMAL LOW (ref 22–32)
CREATININE: 5.13 mg/dL — AB (ref 0.61–1.24)
Calcium: 7.3 mg/dL — ABNORMAL LOW (ref 8.9–10.3)
Chloride: 93 mmol/L — ABNORMAL LOW (ref 101–111)
GFR, EST AFRICAN AMERICAN: 10 mL/min — AB (ref >60)
GFR, EST NON AFRICAN AMERICAN: 9 mL/min — AB (ref >60)
Glucose, Bld: 140 mg/dL — ABNORMAL HIGH (ref 65–99)
Potassium: 5.3 mmol/L — ABNORMAL HIGH (ref 3.5–5.1)
Sodium: 139 mmol/L (ref 135–145)
TOTAL PROTEIN: 6.7 g/dL (ref 6.5–8.1)

## 2014-10-02 LAB — URINALYSIS, ROUTINE W REFLEX MICROSCOPIC
Glucose, UA: NEGATIVE mg/dL
Ketones, ur: 15 mg/dL — AB
Nitrite: NEGATIVE
Protein, ur: >300 mg/dL — AB
Specific Gravity, Urine: 1.024 (ref 1.005–1.030)
UROBILINOGEN UA: 0.2 mg/dL (ref 0.0–1.0)
pH: 5 (ref 5.0–8.0)

## 2014-10-02 LAB — CBC WITH DIFFERENTIAL/PLATELET
Basophils Absolute: 0 10*3/uL (ref 0.0–0.1)
Basophils Relative: 0 % (ref 0–1)
EOS ABS: 0 10*3/uL (ref 0.0–0.7)
EOS PCT: 0 % (ref 0–5)
HCT: 39.6 % (ref 39.0–52.0)
Hemoglobin: 11.7 g/dL — ABNORMAL LOW (ref 13.0–17.0)
LYMPHS ABS: 1.1 10*3/uL (ref 0.7–4.0)
Lymphocytes Relative: 9 % — ABNORMAL LOW (ref 12–46)
MCH: 26.9 pg (ref 26.0–34.0)
MCHC: 29.5 g/dL — ABNORMAL LOW (ref 30.0–36.0)
MCV: 91 fL (ref 78.0–100.0)
Monocytes Absolute: 0.5 10*3/uL (ref 0.1–1.0)
Monocytes Relative: 4 % (ref 3–12)
Neutro Abs: 10.7 10*3/uL — ABNORMAL HIGH (ref 1.7–7.7)
Neutrophils Relative %: 87 % — ABNORMAL HIGH (ref 43–77)
Platelets: 221 10*3/uL (ref 150–400)
RBC: 4.35 MIL/uL (ref 4.22–5.81)
RDW: 20.4 % — ABNORMAL HIGH (ref 11.5–15.5)
WBC: 12.3 10*3/uL — ABNORMAL HIGH (ref 4.0–10.5)

## 2014-10-02 LAB — I-STAT ARTERIAL BLOOD GAS, ED
Acid-base deficit: 29 mmol/L — ABNORMAL HIGH (ref 0.0–2.0)
Bicarbonate: 3.2 mEq/L — ABNORMAL LOW (ref 20.0–24.0)
O2 SAT: 97 %
pCO2 arterial: 15.6 mmHg — CL (ref 35.0–45.0)
pH, Arterial: 6.888 — CL (ref 7.350–7.450)
pO2, Arterial: 145 mmHg — ABNORMAL HIGH (ref 80.0–100.0)

## 2014-10-02 LAB — I-STAT CG4 LACTIC ACID, ED: Lactic Acid, Venous: 13.91 mmol/L (ref 0.5–2.0)

## 2014-10-02 LAB — I-STAT TROPONIN, ED: Troponin i, poc: 0.21 ng/mL (ref 0.00–0.08)

## 2014-10-02 MED ORDER — PIPERACILLIN-TAZOBACTAM 3.375 G IVPB 30 MIN
3.3750 g | Freq: Once | INTRAVENOUS | Status: AC
  Administered 2014-10-02: 3.375 g via INTRAVENOUS
  Filled 2014-10-02: qty 50

## 2014-10-02 MED ORDER — VANCOMYCIN HCL IN DEXTROSE 1-5 GM/200ML-% IV SOLN
1000.0000 mg | Freq: Once | INTRAVENOUS | Status: AC
  Administered 2014-10-02: 1000 mg via INTRAVENOUS
  Filled 2014-10-02: qty 200

## 2014-10-02 MED ORDER — VANCOMYCIN HCL IN DEXTROSE 1-5 GM/200ML-% IV SOLN: 1000.0000 mg | INTRAVENOUS | Status: DC

## 2014-10-02 MED ORDER — NOREPINEPHRINE BITARTRATE 1 MG/ML IV SOLN
0.0000 ug/min | Freq: Once | INTRAVENOUS | Status: AC
  Administered 2014-10-02: 5 ug/min via INTRAVENOUS
  Filled 2014-10-02: qty 4

## 2014-10-02 MED ORDER — SODIUM CHLORIDE 0.9 % IV BOLUS (SEPSIS)
2000.0000 mL | Freq: Once | INTRAVENOUS | Status: AC
  Administered 2014-10-02: 2000 mL via INTRAVENOUS

## 2014-10-02 MED ORDER — PIPERACILLIN-TAZOBACTAM IN DEX 2-0.25 GM/50ML IV SOLN
2.2500 g | Freq: Four times a day (QID) | INTRAVENOUS | Status: DC
Start: 2014-10-03 — End: 2014-10-03
  Filled 2014-10-02 (×2): qty 50

## 2014-10-04 LAB — URINE CULTURE

## 2014-10-09 LAB — CULTURE, BLOOD (ROUTINE X 2)
CULTURE: NO GROWTH
CULTURE: NO GROWTH

## 2014-10-11 IMAGING — CR DG CHEST 1V
1 series · 1 of 1 positions shown · non-contrast
Comparison: 08/01/2010

CLINICAL DATA: Preop left hip fracture

CHEST - 1 VIEW

[x chest ap]
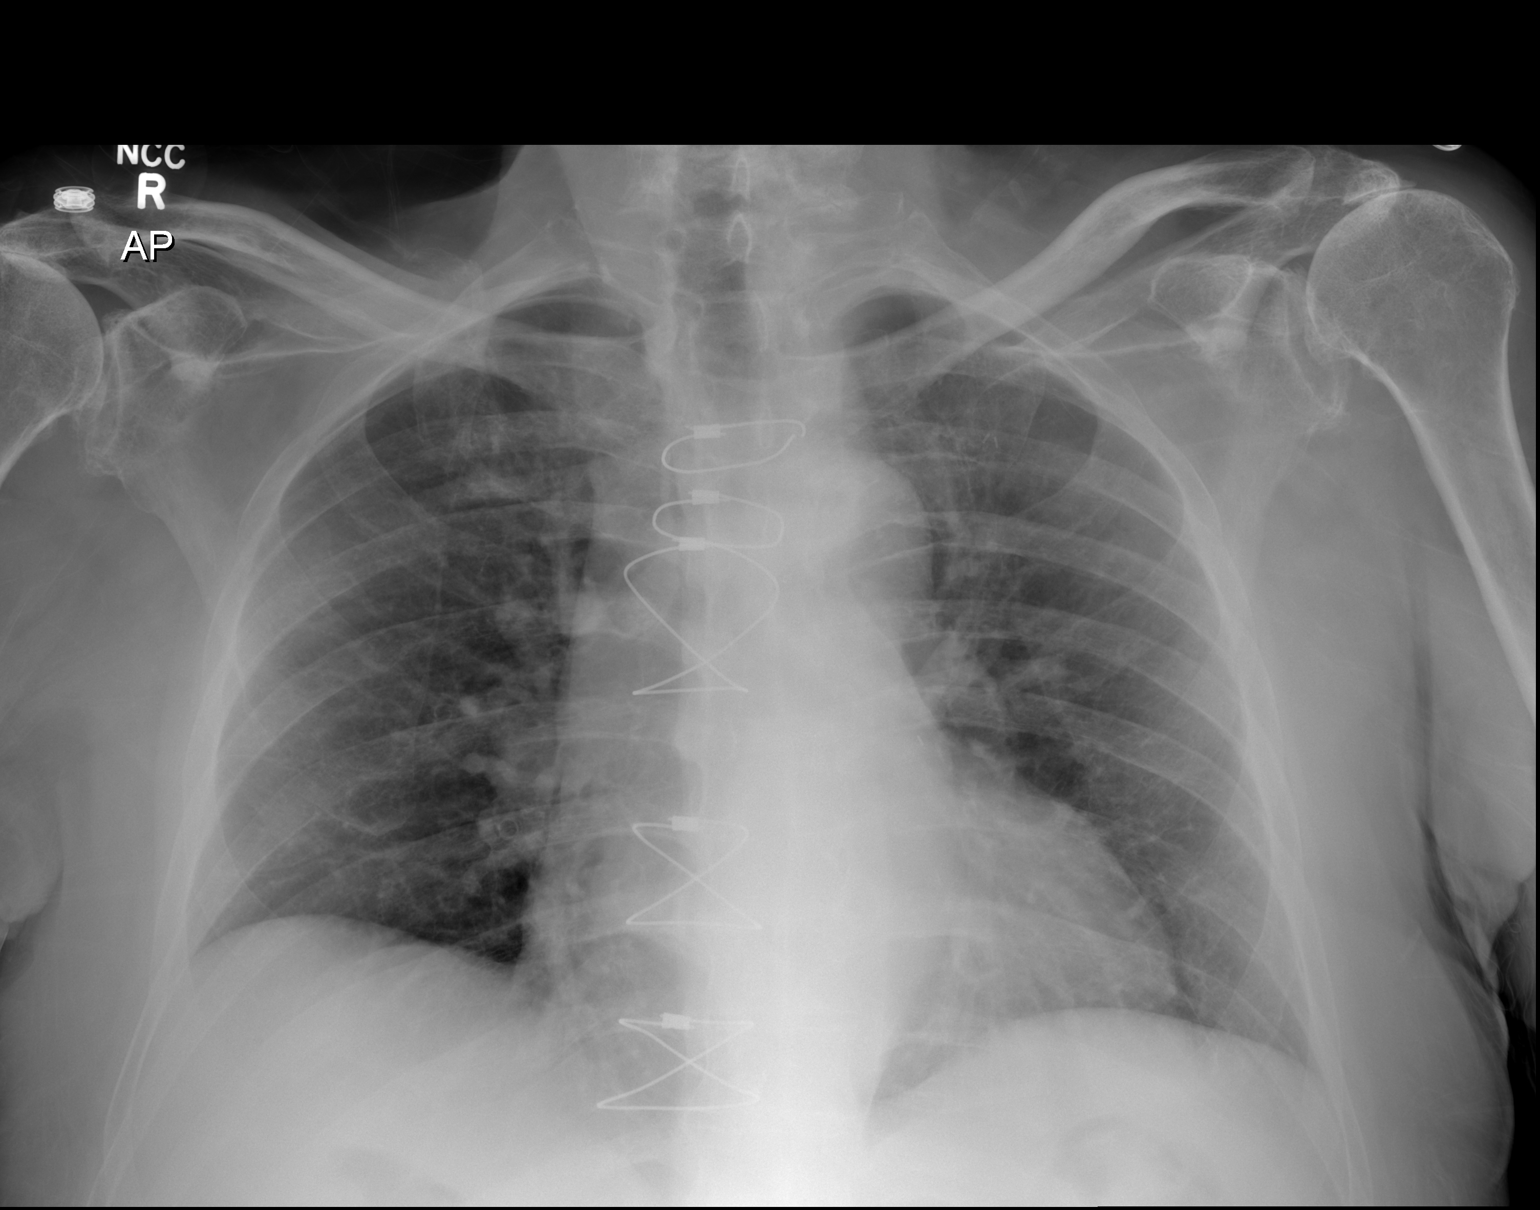

[1 of 1 positions shown; findings below may reference images not displayed]

FINDINGS: Lungs are clear. No pleural effusion or pneumothorax.

Heart is top normal in size. Postsurgical changes related to prior
CABG.
IMPRESSION: No evidence of acute cardiopulmonary disease.

## 2014-10-11 IMAGING — CR DG HIP (WITH OR WITHOUT PELVIS) 2-3V*L*
3 series · 3 of 3 positions shown · non-contrast
Comparison: None.

CLINICAL DATA: Fall

LEFT HIP - COMPLETE 2+ VIEW

[t pelvis a.p.]
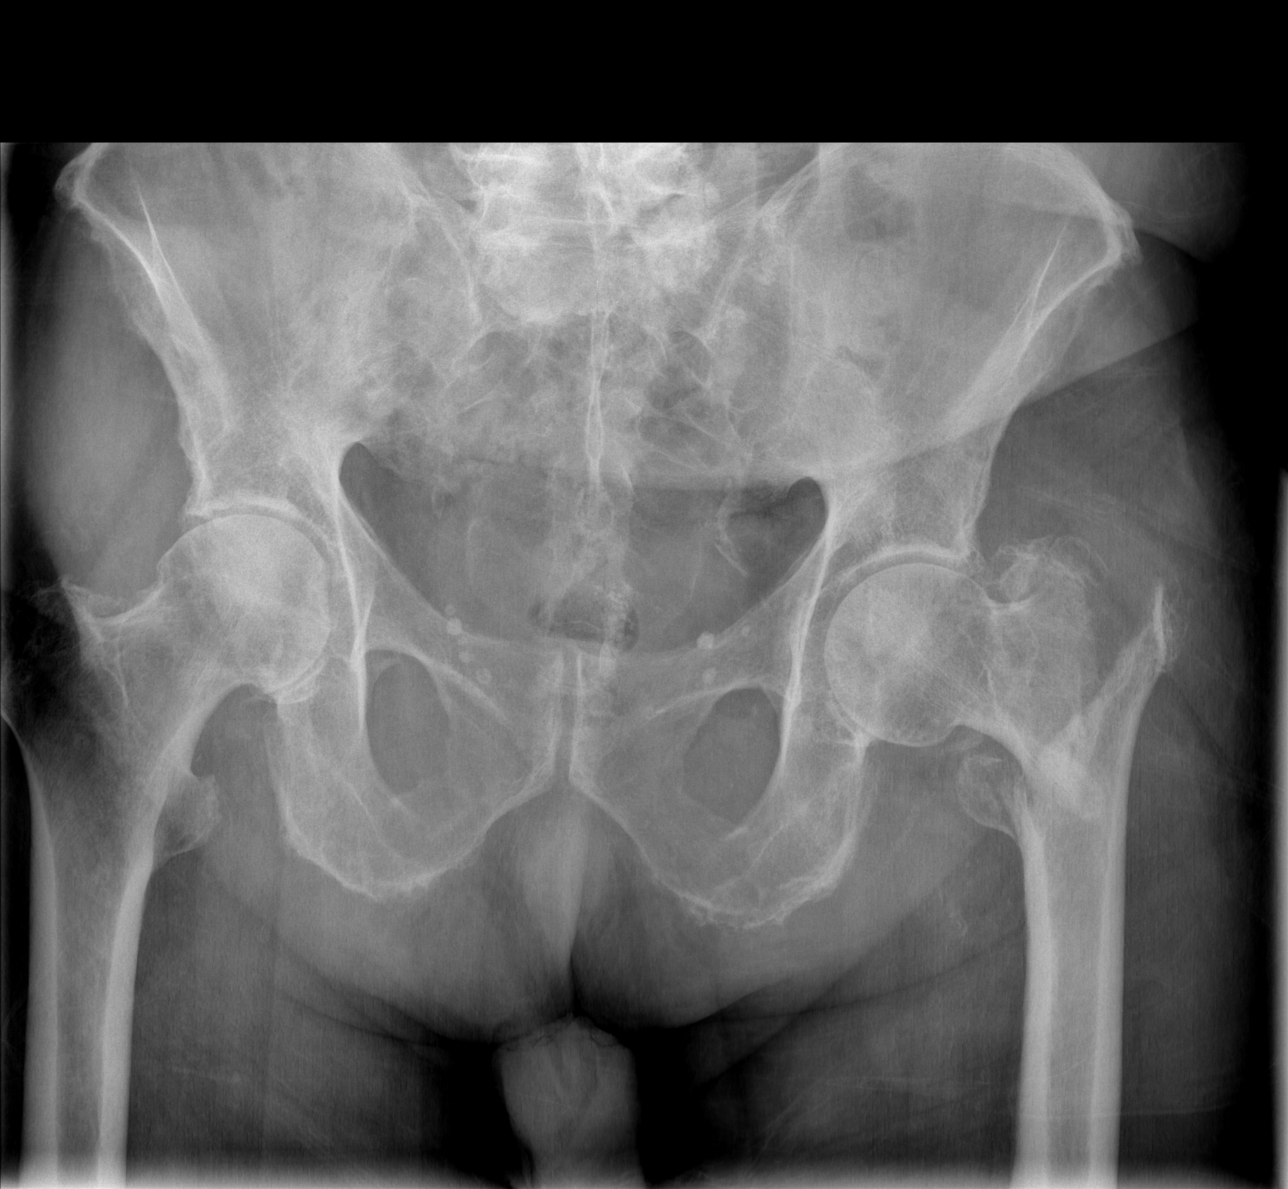

[t hip ap left]
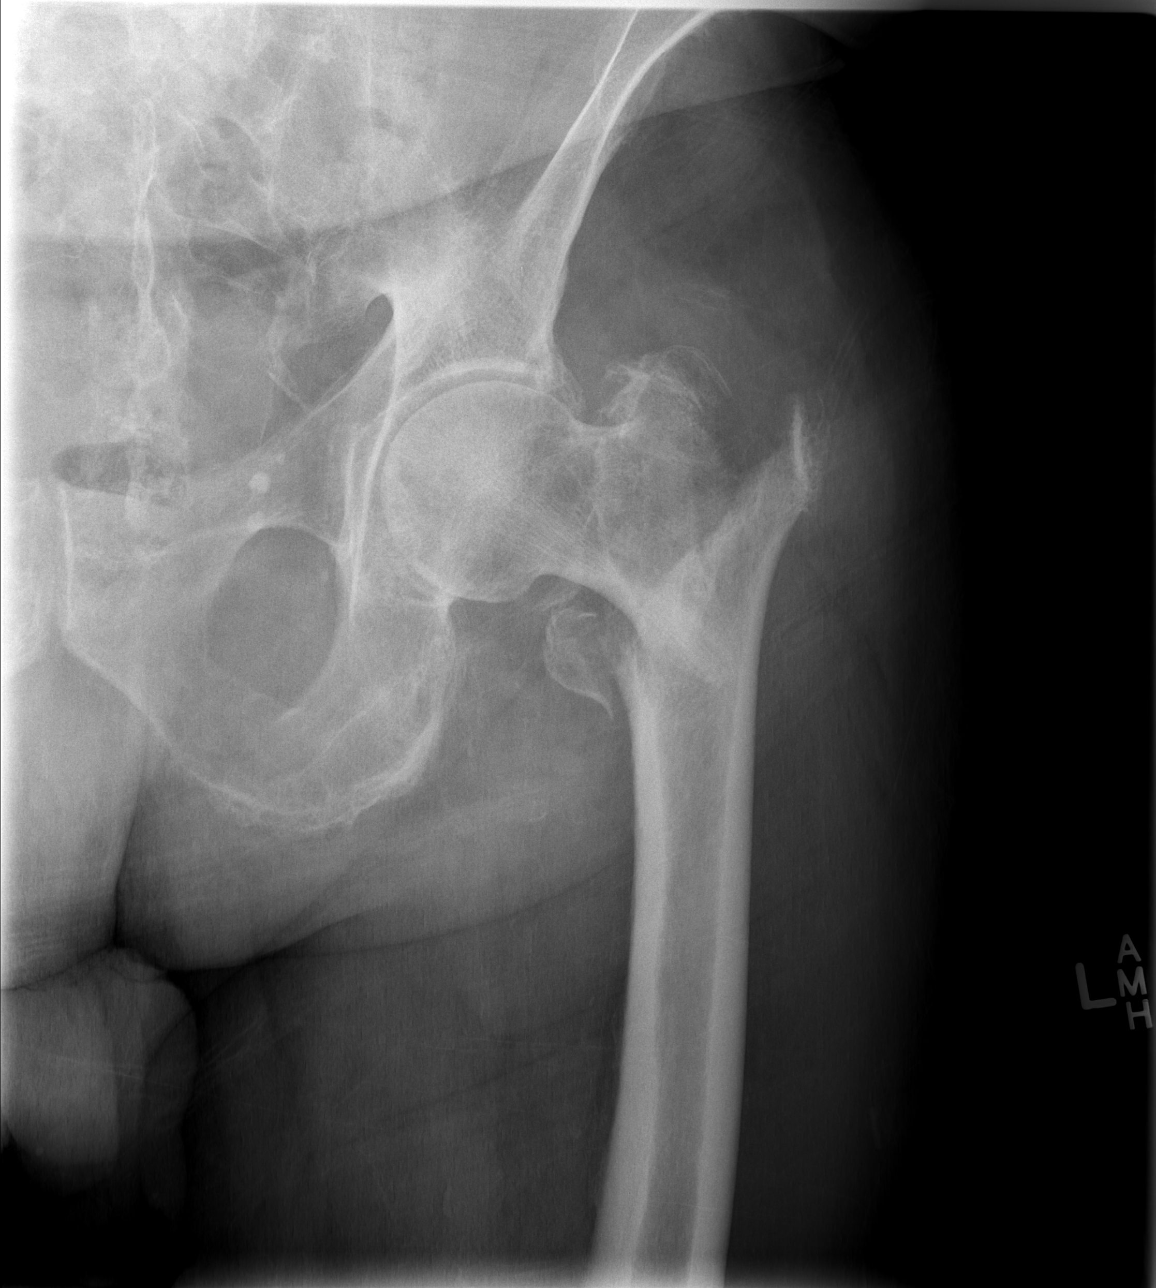

[w hip lat *]
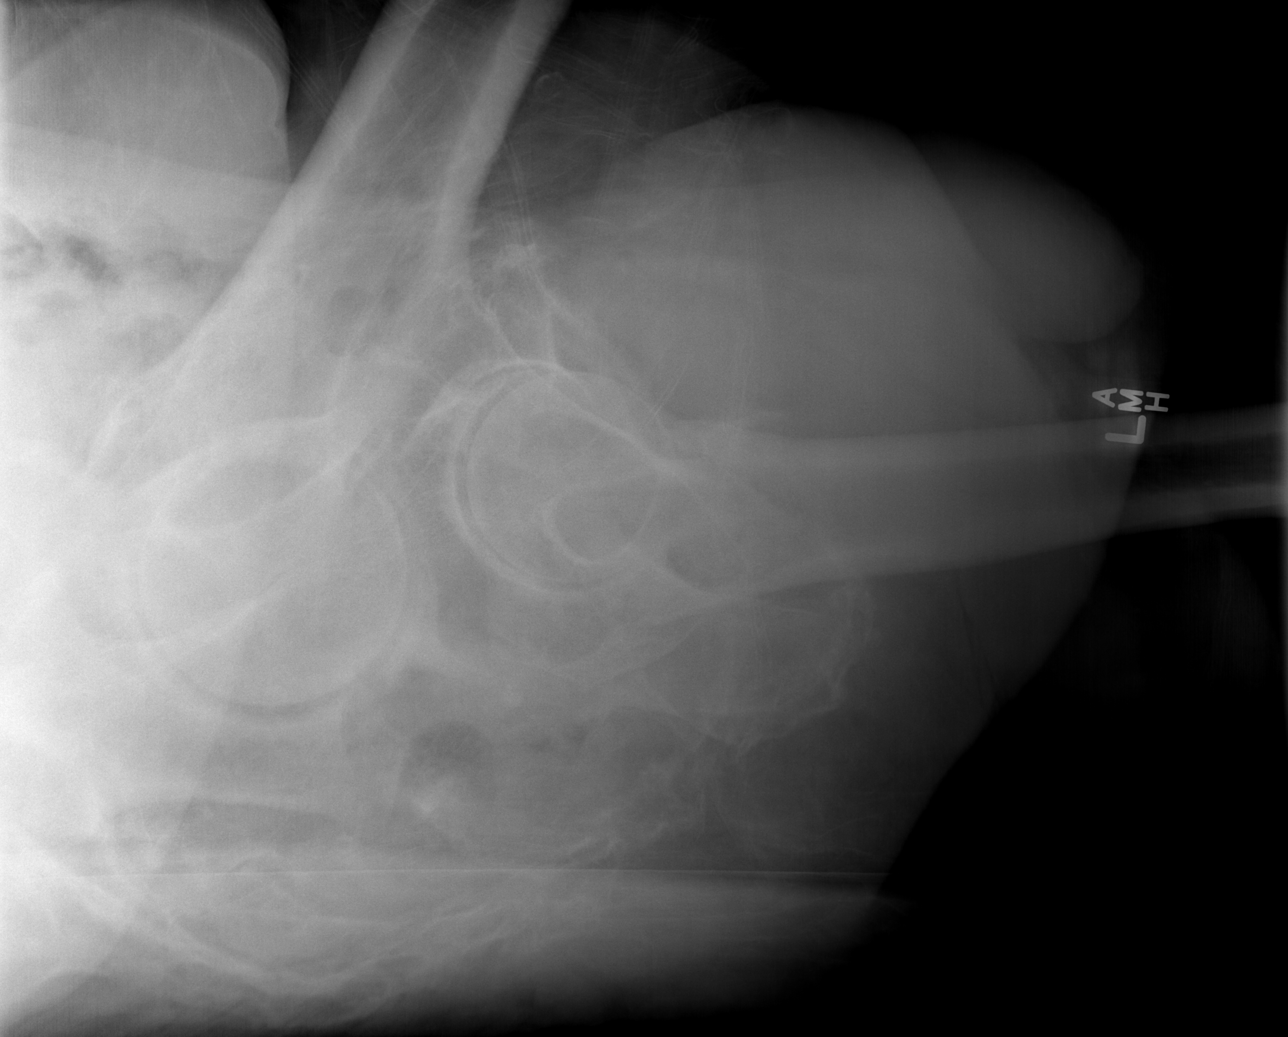

[3 of 3 positions shown; findings below may reference images not displayed]

FINDINGS: Intertrochanteric fracture of the left hip with
angulation and mild displacement.

Mild degenerative change in the left hip joint.
IMPRESSION: Intertrochanteric fracture left femur.

## 2014-10-12 IMAGING — CR DG PORTABLE PELVIS
3 series · 3 of 3 positions shown · non-contrast
Comparison: 07/29/2012

CLINICAL DATA: ORIF left hip.  Fluoroscopy time recorded as of 1
minute 12 seconds.

OPERATIVE LEFT HIP,PORTABLE PELVIS

[AP (1 of 3)]
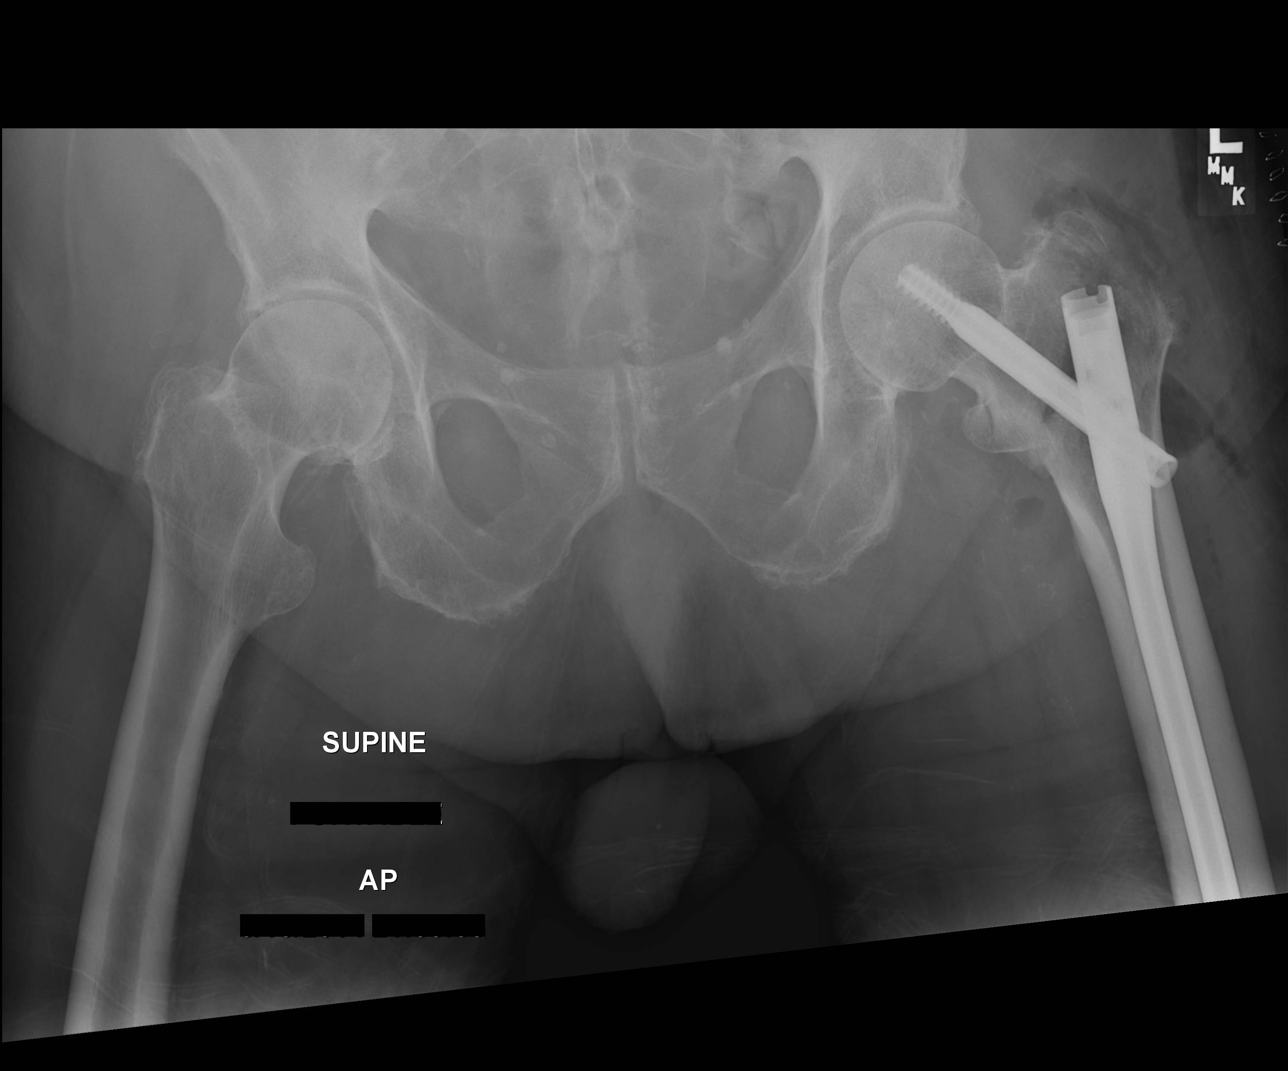

[AP (2 of 3)]
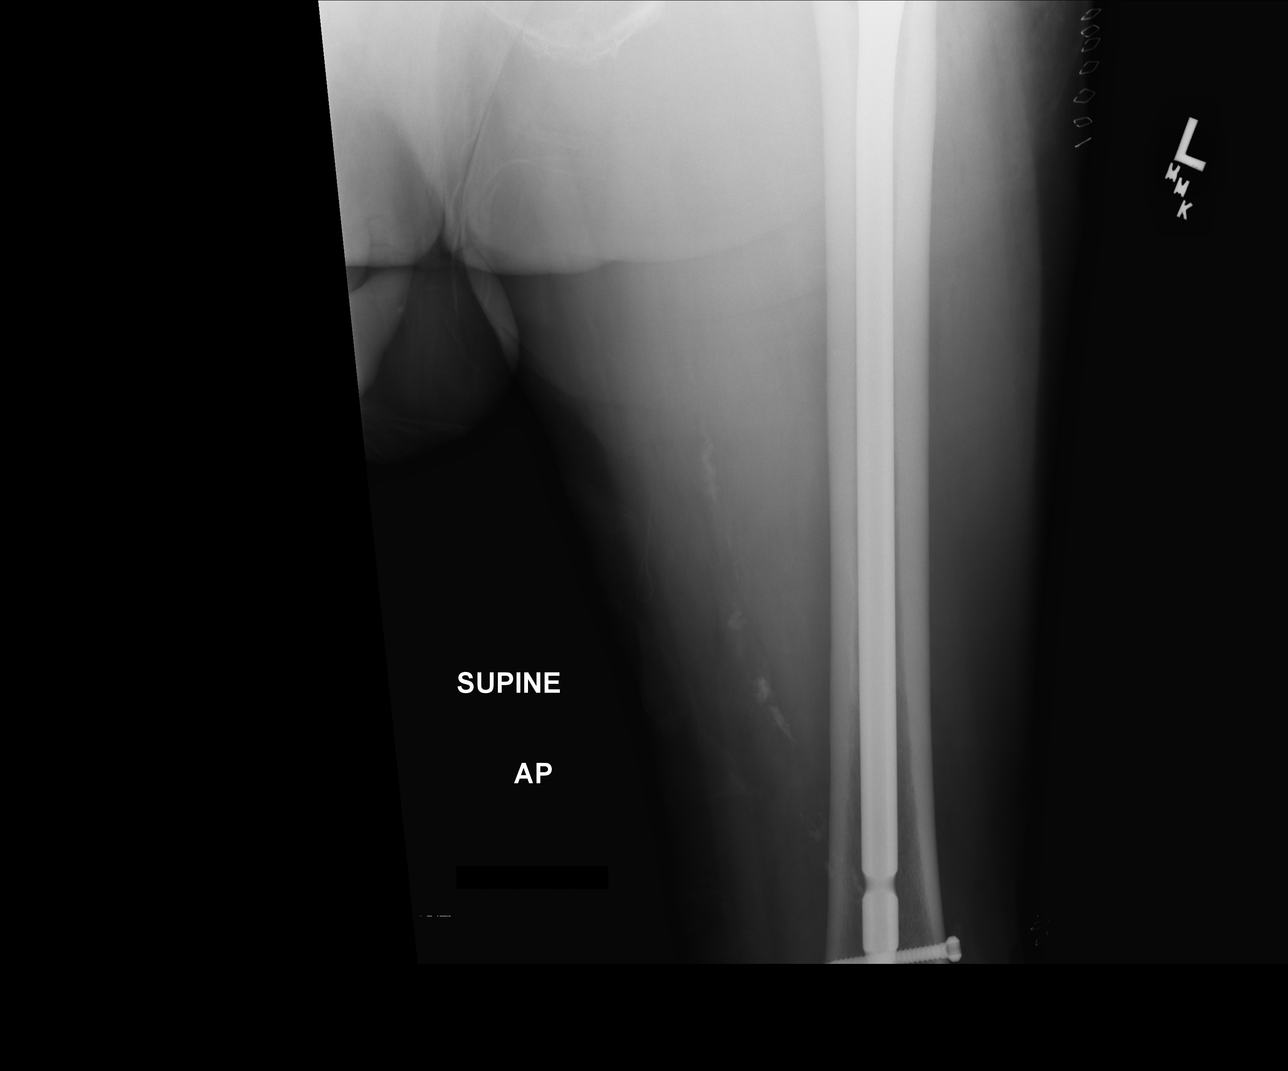

[AP (3 of 3)]
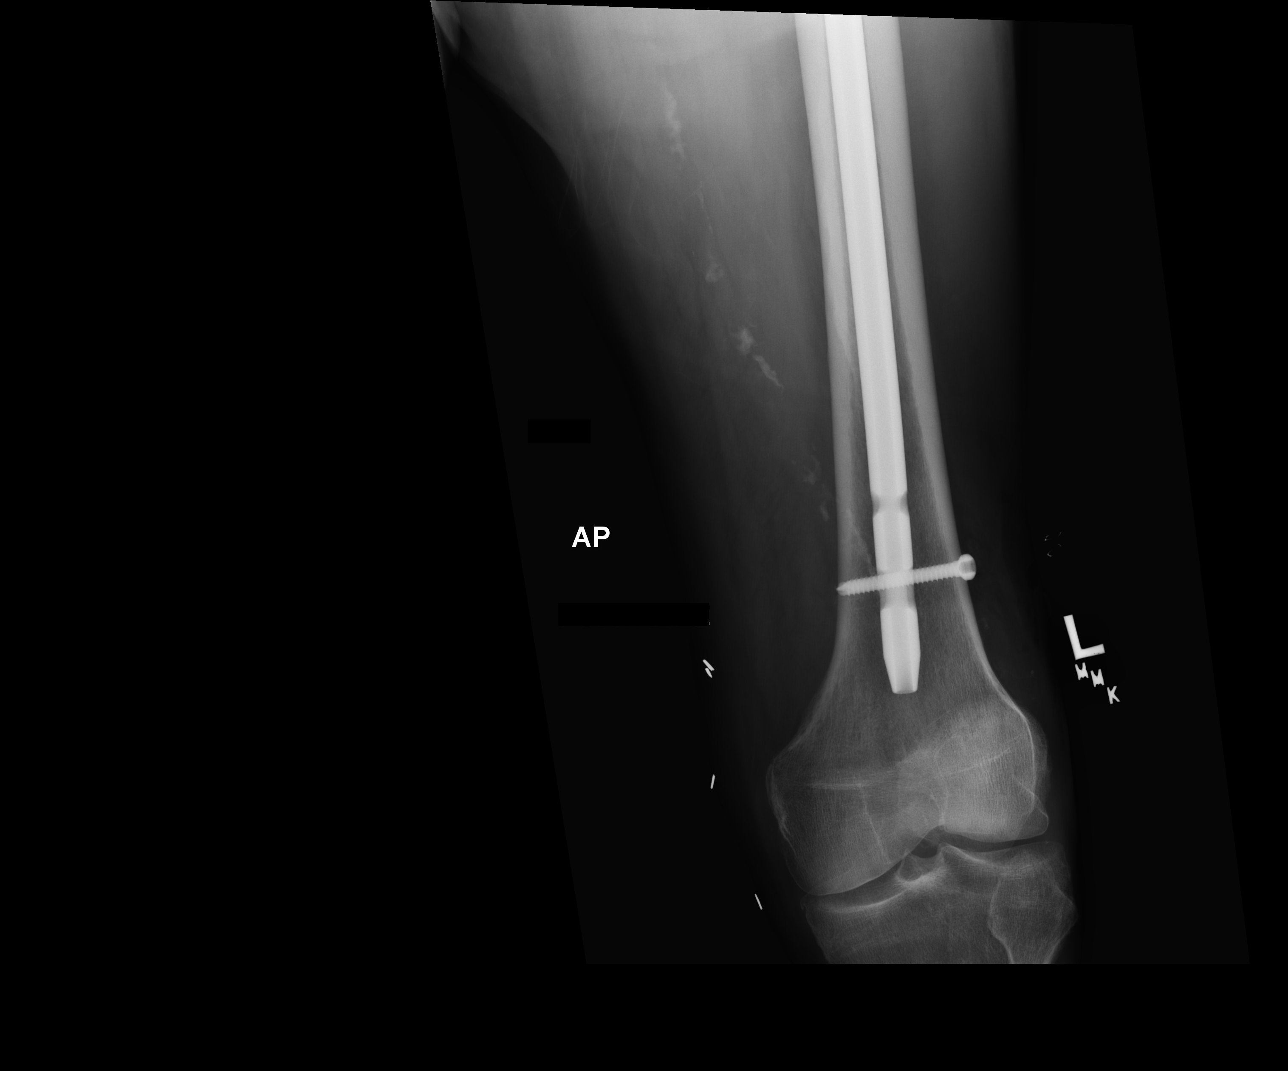

[3 of 3 positions shown; findings below may reference images not displayed]

FINDINGS: Intraoperative spot fluoroscopic images of the left hip
are obtained for surgical control purposes demonstrating interval
placement of N H measure [employer] in the femur with distal locking
screw and proximal compression bolt in the left hip transfixing the
previous intertrochanteric fractures.  Near anatomic alignment and
position of the fracture fragments with mildly displaced lesser
trochanteric fragment.

Subsequent portable views of the left hip demonstrate placement of
the internal fixation hardware as described.  Visualized hardware
components appear well seated.  Skin clips and subcutaneous
emphysema consistent with recent surgery.  Degenerative changes
noted in the right hip.
IMPRESSION: Internal fixation of intertrochanteric fractures of the left hip
using intramedullary rod with distal locking screw and proximal
compression vault.  Improved alignment and position of fracture
fragments since prior preoperative study.

## 2014-10-14 NOTE — Progress Notes (Signed)
ANTIBIOTIC CONSULT NOTE - INITIAL  Pharmacy Consult for Vancomycin and Zosyn Indication: Sepsis  Allergies  Allergen Reactions  . Atorvastatin Other (See Comments)    Muscle weakness   . Pravastatin Sodium Other (See Comments)    Muscle weakness   . Rosuvastatin Other (See Comments)    Muscle weakness     Patient Measurements: Height: 5\' 10"  (177.8 cm) Weight: 139 lb (63.05 kg) IBW/kg (Calculated) : 73  Vital Signs: Temp: 93.1 F (33.9 C) (05/20 1753) Temp Source: Oral (05/20 1753) BP: 58/36 mmHg (05/20 1800) Pulse Rate: 42 (05/20 1757) Intake/Output from previous day:   Intake/Output from this shift:    Labs: No results for input(s): WBC, HGB, PLT, LABCREA, CREATININE in the last 72 hours. CrCl cannot be calculated (Patient has no serum creatinine result on file.). No results for input(s): VANCOTROUGH, VANCOPEAK, VANCORANDOM, GENTTROUGH, GENTPEAK, GENTRANDOM, TOBRATROUGH, TOBRAPEAK, TOBRARND, AMIKACINPEAK, AMIKACINTROU, AMIKACIN in the last 72 hours.   Microbiology: No results found for this or any previous visit (from the past 720 hour(s)).  Medical History: Past Medical History  Diagnosis Date  . Mixed hyperlipidemia   . Benign essential HTN   . CAD (coronary artery disease)     a. CABG 1983. b. Re-do CABG 1997. c. Grafts patent 2003.  Marland Kitchen. Hyperlipidemia   . Skin rash     of uncertain etiology  . Prostate enlargement     "epididymysis"  . CHF (congestive heart failure)   . Type II diabetes mellitus     controlled  . Shortness of breath     "before first heart surgery" (08/16/2012)  . GERD (gastroesophageal reflux disease)   . Hepatitis     "[employer] quit taking my blood cause they say I had this" (08/16/2012)  . Arthritis     "some, in my calves/legs I guess"  (08/16/2012)    Medications:  Scheduled:   Infusions:  . piperacillin-tazobactam    . sodium chloride    . vancomycin     Assessment: 81 yo M presenting to ED on 2014/08/30 with progressive  weakness, lethargy and AMS. Pharmacy has been consulted to dose vancomycin and Zosyn for sepsis. Hypothermic and hypotensive. LA elevated at 13.91, WBC elevated at 12.3, SCr elevated at 4.80 (CrCl ~9 ml/min). Blood and urine cultures sent.   Goal of Therapy:  Vancomycin trough level 15-20 mcg/ml  Plan:  - Vancomycin 1000 mg IV q48h - Zosyn 3.375 gm IV x 1, followed by 2.25 gm IV q6h - Monitor renal function, temp, WBC, C&S, vanc levels as indicated  Mattalynn Crandle K. Bonnye FavaNicolsen, PharmD, BCPS Clinical Pharmacist - Resident Pager: [phone] Pharmacy: [phone] 2014/08/30 6:53 PM

## 2014-10-14 NOTE — ED Notes (Signed)
Time of death [id number] declared by Dr Artis FlockWolfe.

## 2014-10-14 NOTE — ED Notes (Signed)
NOTIFIED DR. Irving BurtonEMILY WOLF IN PERSON FOR PATIENTS LAB RESULTS OF CG4+LACTIC ACID ,I-STAT CHEM8+ , I-STAT TROPONIN ,@18 :20 PM ,03-06-2015.

## 2014-10-14 NOTE — Progress Notes (Signed)
ABG results handed to Dr Jeraldine LootsLockwood. RT will continue to monitor.

## 2014-10-14 NOTE — ED Notes (Signed)
pxray at bedside

## 2014-10-14 NOTE — ED Notes (Signed)
pts son at bedside

## 2014-10-14 NOTE — Progress Notes (Signed)
Chaplain Note:   Chaplain paged to visit with pt daughter who was bedside. Due to additional family members arriving from out of town, pt being moved to [phone] for family to visit and view.   Daughter in consult B, requested prayer.   Chaplains provided emotional/spiritual support, facilitated story-telling and prayed with pt daughter.   Chaplain will walk pt daughter to [phone] when nurses have Mr. Garro prepped and ready.   Gala RomneyBrown, Rashaud Ybarbo J, Chaplain 12/18/2014

## 2014-10-14 NOTE — ED Provider Notes (Signed)
CSN: [id number]     Arrival date & time 04/20/2015  1717 History   First MD Initiated Contact with Patient 012/10/2014 1726     Chief Complaint  Patient presents with  . Hypotension     (Consider location/radiation/quality/duration/timing/severity/associated sxs/prior Treatment) Patient is a 81 y.o. male presenting with weakness. The history is provided by the EMS personnel. The history is limited by the condition of the patient.  Weakness This is a new problem. The current episode started today. The problem occurs constantly. The problem has been gradually worsening. Associated symptoms include anorexia, fatigue and weakness. Nothing aggravates the symptoms. He has tried nothing for the symptoms. The treatment provided no relief.    Past Medical History  Diagnosis Date  . Mixed hyperlipidemia   . Benign essential HTN   . CAD (coronary artery disease)     a. CABG 1983. b. Re-do CABG 1997. c. Grafts patent 2003.  Marland Kitchen. Hyperlipidemia   . Skin rash     of uncertain etiology  . Prostate enlargement     "epididymysis"  . CHF (congestive heart failure)   . Type II diabetes mellitus     controlled  . Shortness of breath     "before first heart surgery" (08/16/2012)  . GERD (gastroesophageal reflux disease)   . Hepatitis     "[employer] quit taking my blood cause they say I had this" (08/16/2012)  . Arthritis     "some, in my calves/legs I guess"  (08/16/2012)   Past Surgical History  Procedure Laterality Date  . Hydrocele excision Left   . Cardiac catheterization      "I think I've had 4" (08/16/2012)  . Compression hip screw Left 07/30/2012    Procedure: Left Trochanteric Compression Screw (Long);  Surgeon: Eldred MangesMark C Yates, MD;  [facility];  Service: Orthopedics;  Laterality: Left;  . Tonsillectomy  1939  . Hernia repair Right 2012  . Coronary artery bypass graft  1983; ?1996    CABG X4; CABG X1  . Cataract extraction w/ intraocular lens  implant, bilateral Bilateral ~ 2000  . Incision and  drainage hip Left 08/16/2012    hematoma (08/16/2012)  . Incision and drainage hip Left 08/16/2012    Procedure: IRRIGATION AND DEBRIDEMENT HIP;  Surgeon: Eldred MangesMark C Yates, MD;  [facility];  Service: Orthopedics;  Laterality: Left;  Irrigation and Debridement Left Hip  . I&d extremity Left 12/11/2012    Procedure: IRRIGATION AND DEBRIDEMENT EXTREMITY;  Surgeon: Eldred MangesMark C Yates, MD;  [facility];  Service: Orthopedics;  Laterality: Left;  Left Knee Arthroscopic Washout  . Knee arthroscopy Left 12/11/2012    Procedure: ARTHROSCOPY KNEE;  Surgeon: Eldred MangesMark C Yates, MD;  [facility];  Service: Orthopedics;  Laterality: Left;  . Synovectomy Left 12/11/2012    Procedure: SYNOVECTOMY;  Surgeon: Eldred MangesMark C Yates, MD;  [facility];  Service: Orthopedics;  Laterality: Left;   Family History  Problem Relation Age of Onset  . Family history unknown: Yes   History  Substance Use Topics  . Smoking status: Former Smoker -- 1.00 packs/day for 28 years    Types: Cigarettes    Quit date: 05/16/1967  . Smokeless tobacco: Former NeurosurgeonUser    Types: Chew    Quit date: 05/15/2009  . Alcohol Use: Yes     Comment: 08/16/2012 "once/year I get a gallon of homemade wine"    Review of Systems  Unable to perform ROS: Mental status change  Constitutional: Positive for fatigue.  Gastrointestinal: Positive for anorexia.  Neurological: Positive for weakness.      Allergies  Atorvastatin; Pravastatin sodium; and Rosuvastatin  Home Medications   Prior to Admission medications   Medication Sig Start Date End Date Taking? Authorizing Provider  alfuzosin (UROXATRAL) 10 MG 24 hr tablet Take 10 mg by mouth daily.      Historical Provider, MD  atenolol (TENORMIN) 25 MG tablet Take 1 tablet (25 mg total) by mouth daily. 09/25/14   Wendall Stade, MD  celecoxib (CELEBREX) 100 MG capsule Take 100 mg by mouth daily. 07/31/14   Historical Provider, MD  diclofenac (FLECTOR) 1.3 % PTCH Place 1 patch onto the skin 2 (two) times daily.     Historical Provider, MD  ezetimibe-simvastatin (VYTORIN) 10-10 MG per tablet Take 1 tablet by mouth at bedtime. 04/15/12   Wendall Stade, MD  finasteride (PROSCAR) 5 MG tablet Take 5 mg by mouth daily.      Historical Provider, MD  FLUoxetine (PROZAC) 10 MG capsule Take 10 mg by mouth daily. 09/10/14   Historical Provider, MD  furosemide (LASIX) 40 MG tablet Take 0.5 tablets (20 mg total) by mouth daily. 09/25/14   Wendall Stade, MD  metFORMIN (GLUCOPHAGE) 1000 MG tablet Take 1,000 mg by mouth 2 (two) times daily.    Historical Provider, MD  ondansetron (ZOFRAN) 4 MG tablet Take 4 mg by mouth every 8 (eight) hours as needed (for gas).    Historical Provider, MD  traMADol (ULTRAM) 50 MG tablet Take 50 mg by mouth daily. 09/10/14   Historical Provider, MD   BP 34/24 mmHg  Pulse 27  Temp(Src) 91.9 F (33.3 C) (Oral)  Resp 0  Ht  (1.778 m)  Wt 139 lb (63.05 kg)  BMI 19.94 kg/m2  SpO2 100% Physical Exam  Constitutional: He appears lethargic. He appears ill.  HENT:  Head: Normocephalic and atraumatic.  Mouth/Throat: Mucous membranes are dry.  Eyes: Conjunctivae and EOM are normal. Pupils are equal, round, and reactive to light.  Neck: Normal range of motion.  Cardiovascular: Normal rate, regular rhythm, normal heart sounds and intact distal pulses.  Exam reveals no gallop and no friction rub.   No murmur heard. Pulmonary/Chest: Tachypnea noted. No respiratory distress. He has no wheezes. He has no rales.  Abdominal: Soft. Bowel sounds are normal. There is no tenderness. There is no rebound and no guarding.  Musculoskeletal: Normal range of motion. He exhibits no edema or tenderness.  Neurological: He has normal strength. He appears lethargic. No cranial nerve deficit or sensory deficit. He exhibits normal muscle tone. GCS eye subscore is 2. GCS verbal subscore is 2. GCS motor subscore is 5.  Skin: Skin is dry. No rash noted. No erythema. There is pallor.  Skin cool to touch  Nursing  note and vitals reviewed.   ED Course  CENTRAL LINE Date/Time: 10/28/2014 6:30 PM Performed by: Jodean Lima Authorized by: Gerhard Munch Consent: The procedure was performed in an emergent situation. Patient identity confirmed: arm band Time out: Immediately prior to procedure a "time out" was called to verify the correct patient, procedure, equipment, support staff and site/side marked as required. Indications: vascular access Anesthesia: local infiltration Local anesthetic: lidocaine 1% with epinephrine Anesthetic total: 3 ml Patient sedated: no Preparation: skin prepped with 2% chlorhexidine Skin prep agent dried: skin prep agent completely dried prior to procedure Sterile barriers: all five maximum sterile barriers used - cap, mask, sterile gown, sterile gloves, and large sterile sheet Hand hygiene:  hand hygiene performed prior to central venous catheter insertion Location details: right internal jugular Patient position: Trendelenburg Catheter type: triple lumen Pre-procedure: landmarks identified Ultrasound guidance: yes Sterile ultrasound techniques: sterile gel and sterile probe covers were used Number of attempts: 1 Successful placement: yes Post-procedure: line sutured and dressing applied Assessment: blood return through all ports,  free fluid flow,  placement verified by x-ray and no pneumothorax on x-ray Patient tolerance: Patient tolerated the procedure well with no immediate complications   (including critical care time) Labs Review Labs Reviewed  COMPREHENSIVE METABOLIC PANEL - Abnormal; Notable for the following:    Potassium 5.3 (*)    Chloride 93 (*)    CO2 6 (*)    Glucose, Bld 140 (*)    BUN 61 (*)    Creatinine, Ser 5.13 (*)    Calcium 7.3 (*)    Albumin 3.4 (*)    ALT 13 (*)    GFR calc non Af Amer 9 (*)    GFR calc Af Amer 10 (*)    Anion gap 40 (*)    All other components within normal limits  CBC WITH DIFFERENTIAL/PLATELET - Abnormal;  Notable for the following:    WBC 12.3 (*)    Hemoglobin 11.7 (*)    MCHC 29.5 (*)    RDW 20.4 (*)    Neutrophils Relative % 87 (*)    Neutro Abs 10.7 (*)    Lymphocytes Relative 9 (*)    All other components within normal limits  PROTIME-INR - Abnormal; Notable for the following:    Prothrombin Time 20.1 (*)    INR 1.71 (*)    All other components within normal limits  URINALYSIS, ROUTINE W REFLEX MICROSCOPIC - Abnormal; Notable for the following:    Color, Urine RED (*)    APPearance TURBID (*)    Hgb urine dipstick MODERATE (*)    Bilirubin Urine SMALL (*)    Ketones, ur 15 (*)    Protein, ur >300 (*)    Leukocytes, UA LARGE (*)    All other components within normal limits  URINE MICROSCOPIC-ADD ON - Abnormal; Notable for the following:    Squamous Epithelial / LPF FEW (*)    Bacteria, UA FEW (*)    Casts GRANULAR CAST (*)    All other components within normal limits  I-STAT CHEM 8, ED - Abnormal; Notable for the following:    Potassium 5.2 (*)    BUN 69 (*)    Creatinine, Ser 4.80 (*)    Glucose, Bld 135 (*)    Calcium, Ion 0.86 (*)    All other components within normal limits  I-STAT CG4 LACTIC ACID, ED - Abnormal; Notable for the following:    Lactic Acid, Venous 13.91 (*)    All other components within normal limits  I-STAT TROPOININ, ED - Abnormal; Notable for the following:    Troponin i, poc 0.21 (*)    All other components within normal limits  I-STAT ARTERIAL BLOOD GAS, ED - Abnormal; Notable for the following:    pH, Arterial 6.888 (*)    pCO2 arterial 15.6 (*)    pO2, Arterial 145.0 (*)    Bicarbonate 3.2 (*)    Acid-base deficit 29.0 (*)    All other components within normal limits  URINE CULTURE  CULTURE, BLOOD (ROUTINE X 2)  CULTURE, BLOOD (ROUTINE X 2)    Imaging Review No results found.   EKG Interpretation   Date/Time:  Friday Oct 02 2014  17:24:42 EDT Ventricular Rate:  74 PR Interval:    QRS Duration: 144 QT Interval:  494 QTC  Calculation: 548 R Axis:   -60 Text Interpretation:  Atrial fibrillation Right bundle branch block Atrial  fibrillation Right bundle branch block Artifact Abnormal ekg Confirmed by  Gerhard Munch  MD ([id number]) on 2014/10/20 6:01:55 PM      MDM   Final diagnoses:  Hypotension, unspecified hypotension type  Shock  Lactic acidosis  AKI (acute kidney injury)    81 yo M with PMH of CAD, CHF, HPL, DM, presenting with generalized weakness, lethargy, hypotension.  Son reports that pt has been declining over past 2 weeks, has not been eating or drinking.  Son reports that pt has struggled with depression due to multiple deaths in the family over past 5 months, including his wife and brother who passed a few weeks ago.  No known fevers, chest pain, abdominal pain, N/V/D.  No known falls or trauma. Further history limited due to pt's AMS.  EMS reports on their arrival BP 70/40 with O2 sats 86% on RA.  Placed on 4L O2 and came up to 97%.  Poor IV access, unable to give fluids en route.  On presentation, pt lethargic, moaning, but moving all extremities.  Does not follow commands.  Skin cold to touch.  Irregularly irregular rhythm on CV exam, normal rate.  Lungs clear.  Abdomen benign.  No edema or rashes.  BP on arrival 80/58 with temp of 93.  Workup for sepsis initiated.  Central line placed for pressors after pt not responsive to fluid boluses.  Placed under bear hugger for hypothermia.  Labs notable for lactate of 13, pH 6.8, AKI with creatinine 4.8, troponin 0.21.  Extensive discussion with pt's POA his son about goals of care- son states that pt told him today that he "wanted to die".  Son states that he would like pressors to assist BP until more family arrives, however would like no further aggressive measures done- specifically makes pt DNR/DNI.  Pt's BP continuing to decline despite titration of Levophed.  Family at bedside.  Called to bedside by nurse after no pulses palpated.  Exam with no  pulse, and no cardiac activity auscultated.  Time of death called at 19:53.  Discussed with attending Dr. Jeraldine Loots.     Jodean Lima, MD 10/05/14 1610  Gerhard Munch, MD 10/14/14 1145

## 2014-10-14 NOTE — ED Notes (Signed)
Pt arrives via EMS from home. Family called EMS stating that pt has been weak, lethargic, no drive to ear or drink and altered from baseline. EMS found pt BP to be 70/40.pt was 86% on RA, placed on 4L now 97%. Family states that pts wife and brother have passed recently and pt has been fighting depression, recently put on Celexa.

## 2014-10-14 NOTE — ED Notes (Signed)
Body taken to the morgue.  [name] from patient placement made aware.

## 2014-10-14 NOTE — Progress Notes (Signed)
Pt daughter bedside.   Gala RomneyBrown, Brinley Rosete J, Chaplain Jan 17, 2015

## 2014-10-14 NOTE — ED Notes (Signed)
Received verbal from MD - ok to use central line. Levo moved to central line.

## 2014-10-14 NOTE — Progress Notes (Signed)
Chaplain Note:  Chaplain met with pt's son outside of the room.   Son expressed that his father was 81 years old and has family coming from [place], [place] and other areas.   Son asked for some alone time, will check on him in 15-20 min.   Delford Field, Chaplain 10-14-2014

## 2014-10-14 DEATH — deceased

## 2014-11-23 ENCOUNTER — Encounter: Payer: Self-pay | Admitting: *Deleted

## 2014-12-09 ENCOUNTER — Ambulatory Visit: Payer: Medicare Other | Admitting: Cardiovascular Disease
